# Patient Record
Sex: Female | Born: 1935 | Race: White | Hispanic: No | Smoking: Never smoker
Health system: Southern US, Community
[De-identification: ages and names within clinical notes are randomized; demographics above are authoritative.]

## PROBLEM LIST (undated history)

## (undated) DIAGNOSIS — I480 Paroxysmal atrial fibrillation: Secondary | ICD-10-CM

## (undated) DIAGNOSIS — E785 Hyperlipidemia, unspecified: Secondary | ICD-10-CM

## (undated) DIAGNOSIS — I2721 Secondary pulmonary arterial hypertension: Secondary | ICD-10-CM

## (undated) DIAGNOSIS — I340 Nonrheumatic mitral (valve) insufficiency: Secondary | ICD-10-CM

## (undated) DIAGNOSIS — I10 Essential (primary) hypertension: Secondary | ICD-10-CM

## (undated) DIAGNOSIS — I341 Nonrheumatic mitral (valve) prolapse: Secondary | ICD-10-CM

## (undated) HISTORY — DX: Hyperlipidemia, unspecified: E78.5

## (undated) HISTORY — DX: Nonrheumatic mitral (valve) prolapse: I34.1

## (undated) HISTORY — DX: Nonrheumatic mitral (valve) insufficiency: I34.0

---

## 1978-08-18 HISTORY — PX: BIOPSY BREAST: PRO8

## 1978-08-18 HISTORY — PX: BREAST BIOPSY: SHX20

## 1985-08-18 HISTORY — PX: OTHER SURGICAL HISTORY: SHX169

## 2006-08-18 LAB — CONVERTED CEMR LAB: Pap Smear: NORMAL

## 2007-07-26 ENCOUNTER — Encounter: Payer: Self-pay | Admitting: Internal Medicine

## 2007-10-28 ENCOUNTER — Encounter: Payer: Self-pay | Admitting: Internal Medicine

## 2008-04-27 ENCOUNTER — Encounter: Payer: Self-pay | Admitting: Internal Medicine

## 2008-05-04 ENCOUNTER — Encounter: Payer: Self-pay | Admitting: Internal Medicine

## 2008-08-09 ENCOUNTER — Encounter: Payer: Self-pay | Admitting: Family Medicine

## 2008-08-16 ENCOUNTER — Encounter: Payer: Self-pay | Admitting: Internal Medicine

## 2008-08-16 ENCOUNTER — Encounter: Payer: Self-pay | Admitting: Family Medicine

## 2008-08-18 LAB — HM COLONOSCOPY

## 2008-10-11 ENCOUNTER — Encounter: Payer: Self-pay | Admitting: Family Medicine

## 2008-10-12 ENCOUNTER — Ambulatory Visit: Payer: Self-pay | Admitting: Family Medicine

## 2008-10-12 DIAGNOSIS — M810 Age-related osteoporosis without current pathological fracture: Secondary | ICD-10-CM

## 2008-10-12 DIAGNOSIS — R42 Dizziness and giddiness: Secondary | ICD-10-CM

## 2008-10-12 DIAGNOSIS — I080 Rheumatic disorders of both mitral and aortic valves: Secondary | ICD-10-CM

## 2008-10-12 DIAGNOSIS — E785 Hyperlipidemia, unspecified: Secondary | ICD-10-CM

## 2008-10-12 DIAGNOSIS — H9190 Unspecified hearing loss, unspecified ear: Secondary | ICD-10-CM | POA: Insufficient documentation

## 2008-10-12 DIAGNOSIS — B029 Zoster without complications: Secondary | ICD-10-CM

## 2008-10-12 DIAGNOSIS — I38 Endocarditis, valve unspecified: Secondary | ICD-10-CM | POA: Insufficient documentation

## 2008-10-23 ENCOUNTER — Ambulatory Visit: Payer: Self-pay | Admitting: Cardiovascular Disease

## 2008-10-27 ENCOUNTER — Encounter: Payer: Self-pay | Admitting: Family Medicine

## 2009-01-24 ENCOUNTER — Ambulatory Visit: Payer: Self-pay | Admitting: Family Medicine

## 2009-01-24 LAB — CONVERTED CEMR LAB
ALT: 18 units/L (ref 0–35)
AST: 24 units/L (ref 0–37)
Alkaline Phosphatase: 74 units/L (ref 39–117)
Chloride: 109 meq/L (ref 96–112)
GFR calc non Af Amer: 73.46 mL/min (ref >60)
Glucose, Bld: 88 mg/dL (ref 70–99)
LDL Cholesterol: 108 mg/dL — ABNORMAL HIGH (ref 0–99)
Potassium: 4.4 meq/L (ref 3.5–5.1)
Sodium: 142 meq/L (ref 135–145)
Total Bilirubin: 0.8 mg/dL (ref 0.3–1.2)
VLDL: 14.4 mg/dL (ref 0.0–40.0)

## 2009-01-30 ENCOUNTER — Ambulatory Visit: Payer: Self-pay | Admitting: Family Medicine

## 2009-01-30 DIAGNOSIS — I872 Venous insufficiency (chronic) (peripheral): Secondary | ICD-10-CM | POA: Insufficient documentation

## 2009-04-30 ENCOUNTER — Telehealth: Payer: Self-pay | Admitting: Family Medicine

## 2009-05-08 ENCOUNTER — Encounter: Payer: Self-pay | Admitting: Cardiovascular Disease

## 2009-05-08 ENCOUNTER — Ambulatory Visit: Payer: Self-pay

## 2009-05-15 ENCOUNTER — Ambulatory Visit: Payer: Self-pay | Admitting: Cardiovascular Disease

## 2009-06-11 ENCOUNTER — Telehealth: Payer: Self-pay | Admitting: Family Medicine

## 2009-07-24 ENCOUNTER — Ambulatory Visit: Payer: Self-pay | Admitting: Family Medicine

## 2009-07-24 DIAGNOSIS — I10 Essential (primary) hypertension: Secondary | ICD-10-CM

## 2009-07-24 LAB — CONVERTED CEMR LAB
Albumin: 3.5 g/dL (ref 3.5–5.2)
CO2: 31 meq/L (ref 19–32)
Calcium: 9.2 mg/dL (ref 8.4–10.5)
Cholesterol: 188 mg/dL (ref 0–200)
Creatinine, Ser: 0.9 mg/dL (ref 0.4–1.2)
Glucose, Bld: 87 mg/dL (ref 70–99)
HDL: 57.3 mg/dL (ref >39.00)
Total Protein: 6.1 g/dL (ref 6.0–8.3)
Triglycerides: 73 mg/dL (ref 0.0–149.0)

## 2009-07-31 ENCOUNTER — Ambulatory Visit: Payer: Self-pay | Admitting: Family Medicine

## 2009-07-31 LAB — CONVERTED CEMR LAB: Cholesterol, target level: 200 mg/dL

## 2009-10-12 ENCOUNTER — Encounter: Payer: Self-pay | Admitting: Family Medicine

## 2009-12-05 ENCOUNTER — Ambulatory Visit: Payer: Self-pay | Admitting: Cardiovascular Disease

## 2010-01-30 ENCOUNTER — Ambulatory Visit: Payer: Self-pay | Admitting: Family Medicine

## 2010-01-30 LAB — CONVERTED CEMR LAB
ALT: 22 units/L (ref 0–35)
BUN: 14 mg/dL (ref 6–23)
Chloride: 107 meq/L (ref 96–112)
Cholesterol: 192 mg/dL (ref 0–200)
Creatinine, Ser: 0.8 mg/dL (ref 0.4–1.2)
HDL: 60.7 mg/dL (ref >39.00)
LDL Cholesterol: 120 mg/dL — ABNORMAL HIGH (ref 0–99)
Total Bilirubin: 0.5 mg/dL (ref 0.3–1.2)
Triglycerides: 56 mg/dL (ref 0.0–149.0)
VLDL: 11.2 mg/dL (ref 0.0–40.0)

## 2010-02-12 ENCOUNTER — Ambulatory Visit: Payer: Self-pay | Admitting: Family Medicine

## 2010-07-02 ENCOUNTER — Ambulatory Visit: Payer: Self-pay | Admitting: Cardiovascular Disease

## 2010-07-24 ENCOUNTER — Ambulatory Visit: Payer: Self-pay | Admitting: Family Medicine

## 2010-07-25 LAB — CONVERTED CEMR LAB
AST: 29 units/L (ref 0–37)
Albumin: 3.3 g/dL — ABNORMAL LOW (ref 3.5–5.2)
Alkaline Phosphatase: 72 units/L (ref 39–117)
Basophils Relative: 0.9 % (ref 0.0–3.0)
Eosinophils Relative: 2.5 % (ref 0.0–5.0)
GFR calc non Af Amer: 76.48 mL/min (ref >60.00)
Glucose, Bld: 90 mg/dL (ref 70–99)
LDL Cholesterol: 111 mg/dL — ABNORMAL HIGH (ref 0–99)
Lymphocytes Relative: 22.1 % (ref 12.0–46.0)
Monocytes Relative: 9.8 % (ref 3.0–12.0)
Neutrophils Relative %: 64.7 % (ref 43.0–77.0)
Potassium: 4.4 meq/L (ref 3.5–5.1)
RBC: 4.28 M/uL (ref 3.87–5.11)
Sodium: 141 meq/L (ref 135–145)
Total Bilirubin: 0.6 mg/dL (ref 0.3–1.2)
Total CHOL/HDL Ratio: 3
VLDL: 13.6 mg/dL (ref 0.0–40.0)
WBC: 5 10*3/uL (ref 4.5–10.5)

## 2010-08-02 ENCOUNTER — Ambulatory Visit: Payer: Self-pay | Admitting: Family Medicine

## 2010-09-17 NOTE — Assessment & Plan Note (Signed)
Summary: 8 M F/YU HYPERCHOLESTEROLEMIA/DLO   Vital Signs:  Patient profile:   77 year old female Height:      60 inches Weight:      114.75 pounds BMI:     22.19 Temp:     97.7 degrees F oral Pulse rate:   60 / minute Pulse rhythm:   regular BP sitting:   90 / 60  (left arm) Cuff size:   regular  Vitals Entered By: Benny Lennert CMA Duncan Dull) (February 12, 2010 2:32 PM)  History of Present Illness: Chief complaint 6 month follow up hyperlipidema  Doing well overall.  Some recent increase in swelling in left ankle since weather warm. no ankle pain, or injury.  No calf pain.  Improved when elevated. Exercising three times a wekk, treadmill.   Problems Prior to Update: 1)  Routine Gynecological Examination  (ICD-V72.31) 2)  Other Screening Mammogram  (ICD-V76.12) 3)  Essential Hypertension, Benign  (ICD-401.1) 4)  Venous Insufficiency, Chronic  (ICD-459.81) 5)  Need Prophylactic Vaccination&inoculation Flu  (ICD-V04.81) 6)  Intermittent Vertigo  (ICD-780.4) 7)  Other Osteoporosis  (ICD-733.09) 8)  Hyperlipidemia  (ICD-272.4) 9)  Hx of Herpes Zoster  (ICD-053.9) 10)  Hx of Endocarditis  (ICD-424.90) 11)  Hearing Loss, Tinnitus  (ICD-389.9) 12)  Mitral Regurgitation  (ICD-396.3)  Current Medications (verified): 1)  Zetia 10 Mg Tabs (Ezetimibe) .... Take 1 Tablet By Mouth Once A Day 2)  Calcitonin (Salmon) 200 Unit/act Soln (Calcitonin (Salmon)) .... Use One Spray Per Day Alternating Nostrils. 3)  Multivitamins   Tabs (Multiple Vitamin) .... Take 1 Tablet By Mouth Once A Day  Allergies: 1)  ! Sulfa 2)  ! Codeine 3)  ! Statins 4)  Bishosphonates  Past History:  Past medical, surgical, family and social histories (including risk factors) reviewed, and no changes noted (except as noted below).  Past Medical History: Reviewed history from 12/05/2009 and no changes required. Hyperlipidemia Mitral regurgitation, secondary to mitral valve prolapse.  Past Surgical  History: Reviewed history from 10/12/2008 and no changes required. Hospitalized in 1987 for endocarditis breast bx 1980s: nml   Family History: Reviewed history from 10/12/2008 and no changes required. fahter: MI age 17, CAD mother: unknown cause of death, hip fracture in nursing home sister: esophageeal cancer brother: died "heart gave out" no children  Social History: Reviewed history from 10/12/2008 and no changes required. Retired Married No children Live at [employer] Never Smoked Alcohol use-yes, 1 drink daily, cocktail Drug use-no Regular exercise-yes, 3 times a week at [employer] Diet:   Review of Systems General:  Denies fatigue and fever. CV:  Denies chest pain or discomfort. Resp:  Denies shortness of breath.  Physical Exam  General:  elderly female in NAD Mouth:  MMM Neck:  no carotid bruit or thyromegaly no cervical or supraclavicular lymphadenopathy  Lungs:  Normal respiratory effort, chest expands symmetrically. Lungs are clear to auscultation, no crackles or wheezes. Heart:  RRR with 3/6 late systolic murmur at apex Abdomen:  Bowel sounds positive,abdomen soft and non-tender without masses, organomegaly or hernias noted. Pulses:  R and L posterior tibial pulses are full and equal bilaterally  Extremities:  1+ left pedal edema and trace right pedal edema.    No jooint redness, no pain, full ROM in left ankle Neurologic:  No cranial nerve deficits noted. Station and gait are normal. Plantar reflexes are down-going bilaterally. DTRs are symmetrical throughout. Sensory, motor and coordinative functions appear intact. Skin:  Intact without suspicious lesions or rashes Psych:  Cognition and judgment appear intact. Alert and cooperative with normal attention span and concentration. No apparent delusions, illusions, hallucinations   Impression & Recommendations:  Problem # 1:  HYPERLIPIDEMIA (ICD-272.4) Well controlled. Continue current medication.  Her updated  medication list for this problem includes:    Zetia 10 Mg Tabs (Ezetimibe) .[name]... Take 1 tablet by mouth once a day  Labs Reviewed: SGOT: 30 (01/30/2010)   SGPT: 22 (01/30/2010)  Lipid Goals: Chol Goal: 200 (07/31/2009)   HDL Goal: 40 (07/31/2009)   LDL Goal: 130 (07/31/2009)   TG Goal: 150 (07/31/2009)  Prior 10 Yr Risk Heart Disease: 9 % (07/31/2009)   HDL:60.70 (01/30/2010), 57.30 (07/24/2009)  LDL:120 (01/30/2010), 116 (07/24/2009)  Chol:192 (01/30/2010), 188 (07/24/2009)  Trig:56.0 (01/30/2010), 73.0 (07/24/2009)  Problem # 2:  ESSENTIAL HYPERTENSION, BENIGN (ICD-401.1) Well controlled.  BP today: 90/60 Prior BP: 130/78 (12/05/2009)  Prior 10 Yr Risk Heart Disease: 9 % (07/31/2009)  Labs Reviewed: K+: 4.5 (01/30/2010) Creat: : 0.8 (01/30/2010)   Chol: 192 (01/30/2010)   HDL: 60.70 (01/30/2010)   LDL: 120 (01/30/2010)   TG: 56.0 (01/30/2010)  Problem # 3:  MITRAL REGURGITATION (ICD-396.3) Stable per cards.   Complete Medication List: 1)  Zetia 10 Mg Tabs (Ezetimibe) .... Take 1 tablet by mouth once a day 2)  Calcitonin (salmon) 200 Unit/act Soln (Calcitonin (salmon)) .... Use one spray per day alternating nostrils. 3)  Multivitamins Tabs (Multiple vitamin) .... Take 1 tablet by mouth once a day  Patient Instructions: 1)  Scheduled CPX in 07/2010 with fasting lipids, CMET, cbc prior Dx 401.1  Current Allergies (reviewed today): ! SULFA ! CODEINE ! STATINS BISHOSPHONATES

## 2010-09-17 NOTE — Assessment & Plan Note (Signed)
Summary: 6 month F/U   Visit Type:  Follow-up Referring Provider:  Dr. Tonny Bollman Primary Provider:  Kerby Nora MD  CC:  Denies chest pain or shortness of breath.  " I am doing well"..  History of Present Illness: 77 year old woman with MVP and moderate MR, Hyperlipidemia who presents for routine followup.  Overall she states that she is doing well. She is active, exercises 3 times per week and also uses weights. She denies any shortness of breath, chest pain. She did have some very mild lower extremity edema in her ankles over the summer though this has disappeared.  Review of her family history shows that she does have longevity as her mother passed away at age 61. Her father did pass away at age 74 from a large MI, others in her family have lived into their 24s.  2-D echocardiogram performed May 08, 2009 showed normal left ventricular systolic function. Bileaflet mitral valve prolapse with moderate mitral regurgitation. This is stable from her previous echo studies performed at [facility].  EKG shows normal sinus rhythm with rate 61 beats per minute, no significant ST or T wave changes      Current Medications (verified): 1)  Zetia 10 Mg Tabs (Ezetimibe) .... Take 1 Tablet By Mouth Once A Day 2)  Calcitonin (Salmon) 200 Unit/act Soln (Calcitonin (Salmon)) .... Use One Spray Per Day Alternating Nostrils. 3)  Multivitamins   Tabs (Multiple Vitamin) .... Take 1 Tablet By Mouth Once A Day  Allergies (verified): 1)  ! Sulfa 2)  ! Codeine 3)  ! Statins 4)  Bishosphonates  Past History:  Past Medical History: Last updated: 12/05/2009 Hyperlipidemia Mitral regurgitation, secondary to mitral valve prolapse.  Past Surgical History: Last updated: 10/12/2008 Hospitalized in 1987 for endocarditis breast bx 1980s: nml   Family History: Last updated: 10/12/2008 fahter: MI age 98, CAD mother: unknown cause of death, hip fracture in nursing home sister: esophageeal  cancer brother: died "heart gave out" no children  Social History: Last updated: 10/12/2008 Retired Married No children Live at [employer] Never Smoked Alcohol use-yes, 1 drink daily, cocktail Drug use-no Regular exercise-yes, 3 times a week at [employer] Diet:   Risk Factors: Exercise: yes (10/12/2008)  Risk Factors: Smoking Status: never (10/12/2008)  Review of Systems  The patient denies fever, weight loss, weight gain, vision loss, decreased hearing, hoarseness, chest pain, syncope, dyspnea on exertion, peripheral edema, prolonged cough, abdominal pain, incontinence, muscle weakness, depression, and enlarged lymph nodes.    Vital Signs:  Patient profile:   77 year old female Height:      60 inches Weight:      114 pounds BMI:     22.34 Pulse rate:   61 / minute BP sitting:   108 / 60 Cuff size:   regular  Vitals Entered By: Bishop Dublin, CMA (July 02, 2010 11:01 AM)  Physical Exam  General:  elderly female in NAD Head:  normocephalic and atraumatic Neck:  Neck supple, no JVD. No masses, thyromegaly or abnormal cervical nodes. Lungs:  Clear bilaterally to auscultation and percussion. Heart:  Non-displaced PMI, chest non-tender; regular rate and rhythm, S1, S2 with II/VI SEM at LSB,  no  rubs or gallops. Carotid upstroke normal, no bruit.. Pedals normal pulses. No edema, no varicosities. Abdomen:  Bowel sounds positive; abdomen soft and non-tender without masses Msk:  Back normal, normal gait. Muscle strength and tone normal. Pulses:  pulses normal in all 4 extremities Extremities:  No clubbing or cyanosis.  Neurologic:  Alert and oriented x 3. Skin:  Intact without lesions or rashes. Psych:  Normal affect.   Impression & Recommendations:  Problem # 1:  MITRAL REGURGITATION (ICD-396.3) moderate mitral agitation by history. Last echocardiogram last year. Clinically, she appears stable. As her valve regurgitation has been stable over several years, we'll not  repeat an echocardiogram at this time.  Problem # 2:  HYPERLIPIDEMIA (ICD-272.4) Total cholesterol less than 200 on zetia. We did discuss other cholesterol medications with her and she does not believe that she has been on Lipitor, Crestor or Zocor. We have suggested to her that if she would like to try one of these medications in a low dose in the future, this could be arranged.  Her updated medication list for this problem includes:    Zetia 10 Mg Tabs (Ezetimibe) .[name]... Take 1 tablet by mouth once a day  Problem # 3:  Hx of ENDOCARDITIS (ICD-424.90) She reports a history of endocarditis requiring a long course of antibiotics. She should receive prophylactic antibiotics prior to any dental or surgical procedures.  Patient Instructions: 1)  Your physician recommends that you schedule a follow-up appointment in: 1 year.

## 2010-09-17 NOTE — Assessment & Plan Note (Signed)
Summary: 40m f/u mh   Visit Type:  Follow-up Referring Provider:  Dr. Tonny Bollman Primary Provider:  Kerby Nora MD  CC:  none.  History of Present Illness: 77 year old woman with MVP and moderate MR.  She remains active, reports good energy level, exercises at least 3 times/week. No chest pain, dyspnea, edema, or other complaints.  2-D echocardiogram performed May 08, 2009 showed normal left ventricular systolic function. Bileaflet mitral valve prolapse with moderate mitral regurgitation. This is stable from her previous echo studies performed at [facility].      Current Medications (verified): 1)  Zetia 10 Mg Tabs (Ezetimibe) .... Take 1 Tablet By Mouth Once A Day 2)  Calcitonin (Salmon) 200 Unit/act Soln (Calcitonin (Salmon)) .... Use One Spray Per Day Alternating Nostrils. 3)  Multivitamins   Tabs (Multiple Vitamin) .... Take 1 Tablet By Mouth Once A Day  Allergies (verified): 1)  ! Sulfa 2)  ! Codeine 3)  ! Statins 4)  Bishosphonates  Past History:  Past medical history reviewed for relevance to current acute and chronic problems.  Past Medical History: Hyperlipidemia Mitral regurgitation, secondary to mitral valve prolapse.  Review of Systems       Negative except as per HPI   Vital Signs:  Patient profile:   77 year old female Height:      60 inches Weight:      114.75 pounds BMI:     22.49 Pulse rate:   60 / minute Resp:     14 per minute BP sitting:   130 / 78  (left arm) Cuff size:   large  Physical Exam  General:  Pt is alert and oriented, in no acute distress. HEENT: normal Neck: normal carotid upstrokes without bruits, JVP normal Lungs: CTA CV: RRR with 3/6 late systolic murmur at apex Abd: soft, NT, positive BS, no bruit, no organomegaly Ext: no clubbing, cyanosis, or edema. peripheral pulses 2+ and equal Skin: warm and dry without rash    Echocardiogram  Procedure date:  05/08/2009  Findings:      Study Conclusions            1.  Left ventricle: The cavity size was normal. Wall thickness was        increased in a pattern of mild LVH. Systolic function was normal.        The estimated ejection fraction was in the range of 55% to 65%.        Wall motion was normal; there were no regional wall motion        abnormalities.     2. Mitral valve: Moderate thickening. Moderate prolapse, involving        the anterior leaflet and the posterior leaflet. Moderate        regurgitation.     3. Pulmonary arteries: Systolic pressure was mildly increased.     Impressions:            - Bileaflet prolapse (anterior > posterior); probable moderate MR       (not well seen); suggest TEE to further evaluate if clinically       indicated.  EKG  Procedure date:  12/05/2009  Findings:      NSR, within normal limits, HR 60 bpm.  Impression & Recommendations:  Problem # 1:  MITRAL REGURGITATION (ICD-396.3) Pt with stable MVP with moderate mitral regurgitation. She has NYHA Class I symptoms. Her physical exam is unchanged and LV size has been normal. Recommend 6 month f/u visit  with Dr Mariah Milling. Advised continue SBE prophylaxis.  Problem # 2:  ESSENTIAL HYPERTENSION, BENIGN (ICD-401.1) BP in range - pt not on antihypertensive meds at the present time.  BP today: 130/78 Prior BP: 126/60 (07/31/2009)  Prior 10 Yr Risk Heart Disease: 9 % (07/31/2009)  Labs Reviewed: K+: 4.3 (07/24/2009) Creat: : 0.9 (07/24/2009)   Chol: 188 (07/24/2009)   HDL: 57.30 (07/24/2009)   LDL: 116 (07/24/2009)   TG: 73.0 (07/24/2009)  Patient Instructions: 1)  Your physician recommends that you schedule a follow-up appointment in: 6 months with Dr Mariah Milling

## 2010-09-19 NOTE — Assessment & Plan Note (Signed)
Summary: CPX/DLO  R/S FROM 08/01/10   Vital Signs:  Patient profile:   77 year old female Height:      60 inches Weight:      113.8 pounds BMI:     22.31 Temp:     98.0 degrees F oral Pulse rate:   64 / minute Pulse rhythm:   regular BP sitting:   116 / 72  (left arm) Cuff size:   regular  Vitals Entered By: Benny Lennert CMA Duncan Dull) (August 02, 2010 1:54 PM) CC: Hypertension Management, Lipid Management  Vision Screening:      Vision Comments: patient wears glasses  Vision Entered By: Benny Lennert CMA Duncan Dull) (August 02, 2010 1:55 PM)  Hearing Screen 40db HL: Left  Right  Audiometry Comment: patient can hear whisper from across the room  deaf in left ear.. not interested in hearing aides    Prevention & Chronic Care Immunizations   Influenza vaccine: Historical  (06/17/2010)   Influenza vaccine due: 05/19/2011    Tetanus booster: 08/18/2005: given   Td booster deferral: Not indicated  (08/02/2010)   Tetanus booster due: 08/19/2015    Pneumococcal vaccine: given  (08/18/1993)   Pneumococcal vaccine due: Not Indicated    H. zoster vaccine: 08/02/2010: given  Colorectal Screening   Hemoccult: Not documented   Hemoccult due: Not Indicated    Colonoscopy: normal per pt, done at [facility]  (08/18/2008)   Colonoscopy due: Not Indicated  Other Screening   Pap smear: Normal  (08/18/2006)   Pap smear due: Not Indicated    Mammogram: WISHES to conintue given good health  (08/02/2010)   Mammogram due: 08/03/2011    DXA bone density scan: abnormal, but stable on micalcin  (11/06/2008)   DXA scan due: 11/07/2010    Smoking status: never  (08/02/2010)  Lipids   Total Cholesterol: 181  (07/24/2010)   LDL: 111  (07/24/2010)   LDL Direct: Not documented   HDL: 56.70  (07/24/2010)   Triglycerides: 68.0  (07/24/2010)    SGOT (AST): 29  (07/24/2010)   SGPT (ALT): 22  (07/24/2010)   Alkaline phosphatase: 72  (07/24/2010)   Total bilirubin: 0.6   (07/24/2010)  Hypertension   Last Blood Pressure: 116 / 72  (08/02/2010)   Serum creatinine: 0.8  (07/24/2010)   Serum potassium 4.4  (07/24/2010)  Self-Management Support :    Hypertension self-management support: Not documented    Lipid self-management support: Not documented    History of Present Illness: Chief complaint annual wellness visit  I have personally reviewed the Medicare Annual Wellness questionnaire and have noted 1.   The patient's medical and social history 2.   Their use of alcohol, tobacco or illicit drugs 3.   Their current medications and supplements 4.   The patient's functional ability including ADL's, fall risks, home safety risks and hearing or visual             impairment. 5.   Diet and physical activities 6.   Evidence for depression or mood disorders The patients weight, height, BMI and visual acuity have been recorded in the chart I have made referrals, counseling and provided education to the patient based review of the above and I have provided the pt with a written personalized care plan for preventive services.   Osteoporosis, stable on micalcin.. Last DXA 2010, stable.   MVP and moderate MR: Stable per last OV with Dr. Maryann Conners 05/2010, Last ECHO 04/2009  4 lb weight loss unintended in last  year.  eating less than she used.Well controlled. Continue current medication. appetitie.Marland Kitchenjust eating smaller amounts.  protein levels low.  pt requeste given he good health to continue done mammograms. She would do surgerery/ chemo if she needed.       Hypertension History:      She denies headache, chest pain, palpitations, dyspnea with exertion, peripheral edema, neurologic problems, syncope, and side effects from treatment.  Well controlled .        Positive major cardiovascular risk factors include female age 26 years old or older, hyperlipidemia, and hypertension.  Negative major cardiovascular risk factors include non-tobacco-user status.     Lipid Management History:      Positive NCEP/ATP III risk factors include female age 60 years old or older and hypertension.  Negative NCEP/ATP III risk factors include non-tobacco-user status.        Her compliance with the TLC diet is good.  Adjunctive measures started by the patient include aerobic exercise, folic acid, and weight reduction.  She expresses no side effects from her lipid-lowering medication.  The patient denies any symptoms to suggest myopathy or liver disease.  Comments: On Zetia.    Preventive Screening-Counseling & Management  Alcohol-Tobacco     Alcohol drinks/day: 1     >5/day in last 3 mos: no     Alcohol Counseling: not indicated; use of alcohol is not excessive or problematic     Smoking Status: never  Caffeine-Diet-Exercise     Diet Comments: eating a lot of veggies, fruit, protein ids low.     Diet Counseling: to improve diet; diet is suboptimal     Nutrition Referrals: not indicated.. just increase protein in diet     Does Patient Exercise: yes     Exercise (avg: min/session): 30-60     Times/week: 3  Problems Prior to Update: 1)  Routine Gynecological Examination  (ICD-V72.31) 2)  Other Screening Mammogram  (ICD-V76.12) 3)  Essential Hypertension, Benign  (ICD-401.1) 4)  Venous Insufficiency, Chronic  (ICD-459.81) 5)  Need Prophylactic Vaccination&inoculation Flu  (ICD-V04.81) 6)  Intermittent Vertigo  (ICD-780.4) 7)  Other Osteoporosis  (ICD-733.09) 8)  Hyperlipidemia  (ICD-272.4) 9)  Hx of Herpes Zoster  (ICD-053.9) 10)  Hx of Endocarditis  (ICD-424.90) 11)  Hearing Loss, Tinnitus  (ICD-389.9) 12)  Mitral Regurgitation  (ICD-396.3)  Current Medications (verified): 1)  Zetia 10 Mg Tabs (Ezetimibe) .... Take 1 Tablet By Mouth Once A Day 2)  Calcitonin (Salmon) 200 Unit/act Soln (Calcitonin (Salmon)) .... Use One Spray Per Day Alternating Nostrils. 3)  Multivitamins   Tabs (Multiple Vitamin) .... Take 1 Tablet By Mouth Once A  Day  Allergies: 1)  ! Sulfa 2)  ! Codeine 3)  ! Statins 4)  Bishosphonates  Past History:  Past medical, surgical, family and social histories (including risk factors) reviewed, and no changes noted (except as noted below).  Past Medical History: Reviewed history from 12/05/2009 and no changes required. Hyperlipidemia Mitral regurgitation, secondary to mitral valve prolapse.  Past Surgical History: Reviewed history from 10/12/2008 and no changes required. Hospitalized in 1987 for endocarditis breast bx 1980s: nml   Family History: Reviewed history from 10/12/2008 and no changes required. fahter: MI age 15, CAD mother: unknown cause of death, hip fracture in nursing home sister: esophageeal cancer brother: died "heart gave out" no children  Social History: Reviewed history from 10/12/2008 and no changes required. Retired Married No children Live at [facility] Never Smoked Alcohol use-yes, 1 drink daily, cocktail Drug use-no Regular  exercise-yes, 3 times a week at [facility] Diet:   Review of Systems General:  Denies fatigue and fever. CV:  Denies chest pain or discomfort. Resp:  Denies shortness of breath. GI:  Denies abdominal pain, bloody stools, and change in bowel habits. GU:  Denies abnormal vaginal bleeding, discharge, and dysuria. Psych:  Denies anxiety and depression.  Physical Exam  General:  elderly female in NAD Eyes:  No corneal or conjunctival inflammation noted. EOMI. Perrla. Funduscopic exam benign, without hemorrhages, exudates or papilledema. Vision grossly normal. Ears:  External ear exam shows no significant lesions or deformities.  Otoscopic examination reveals clear canals, tympanic membranes are intact bilaterally without bulging, retraction, inflammation or discharge. Hearing is grossly normal bilaterally. Nose:  External nasal examination shows no deformity or inflammation. Nasal mucosa are pink and moist without lesions or exudates. Mouth:   Oral mucosa and oropharynx without lesions or exudates.  Teeth in good repair. Neck:  no carotid bruit or thyromegaly no cervical or supraclavicular lymphadenopathy  Chest Wall:  No deformities, masses, or tenderness noted. Breasts:  No mass, nodules, thickening, tenderness, bulging, retraction, inflamation, nipple discharge or skin changes noted.   Lungs:  Normal respiratory effort, chest expands symmetrically. Lungs are clear to auscultation, no crackles or wheezes. Heart:  Normal rate and regular rhythm. S1 and S2 normal without gallop, murmur, click, rub or other extra sounds. Abdomen:  Bowel sounds positive,abdomen soft and non-tender without masses, organomegaly or hernias noted. Genitalia:  not indicated Msk:  No deformity or scoliosis noted of thoracic or lumbar spine.   Pulses:  R and L posterior tibial pulses are full and equal bilaterally  Extremities:  no edema  Skin:  Intact without suspicious lesions or rashes Psych:  Cognition and judgment appear intact. Alert and cooperative with normal attention span and concentration. No apparent delusions, illusions, hallucinations   Impression & Recommendations:  Problem # 1:  PREVENTIVE HEALTH CARE (ICD-V70.0)  The patient's preventative maintenance and recommended screening tests for an annual wellness exam were reviewed in full today. Brought up to date unless services declined.  Counselled on the importance of diet, exercise, and its role in overall health and mortality. The patient's FH and SH was reviewed, including their home life, tobacco status, and drug and alcohol status.     Orders: Medicare -1st Annual Wellness Visit [phone])  Problem # 2:  ESSENTIAL HYPERTENSION, BENIGN (ICD-401.1)  Well controlled. Continue current medication.   BP today: 116/72 Prior BP: 108/60 (07/02/2010)  10 Yr Risk Heart Disease: 6 % Prior 10 Yr Risk Heart Disease: 9 % (07/31/2009)  Labs Reviewed: K+: 4.4 (07/24/2010) Creat: : 0.8  (07/24/2010)   Chol: 181 (07/24/2010)   HDL: 56.70 (07/24/2010)   LDL: 111 (07/24/2010)   TG: 68.0 (07/24/2010)  Problem # 3:  HYPERLIPIDEMIA (ICD-272.4) Well controlled.. discussed changing to cehaper med or trial of stopping zetia... She does not want to try stain even low dose. if anything...on follow up in 6 months when she is out of medicaine wants to consider stopping zetia.  Her updated medication list for this problem includes:    Zetia 10 Mg Tabs (Ezetimibe) .[name]... Take 1 tablet by mouth once a day  Problem # 4:  OTHER OSTEOPOROSIS (ICD-733.09) DXA 2010 stable.  Her updated medication list for this problem includes:    Calcitonin (salmon) 200 Unit/act Soln (Calcitonin (salmon)) ..... Use one spray per day alternating nostrils.  Complete Medication List: 1)  Zetia 10 Mg Tabs (Ezetimibe) .... Take 1 tablet by  mouth once a day 2)  Calcitonin (salmon) 200 Unit/act Soln (Calcitonin (salmon)) .... Use one spray per day alternating nostrils. 3)  Multivitamins Tabs (Multiple vitamin) .... Take 1 tablet by mouth once a day  Other Orders: Radiology Referral (Radiology) Zoster (Shingles) Vaccine Live [phone]) Admin 1st Vaccine (82956)  Hypertension Assessment/Plan:      The patient's hypertensive risk group is category B: At least one risk factor (excluding diabetes) with no target organ damage.  Her calculated 10 year risk of coronary heart disease is 6 %.  Today's blood pressure is 116/72.  Her blood pressure goal is < 140/90.  Lipid Assessment/Plan:      Based on NCEP/ATP III, the patient's risk factor category is "2 or more risk factors and a calculated 10 year CAD risk of < 20%".  The patient's lipid goals are as follows: Total cholesterol goal is 200; LDL cholesterol goal is 130; HDL cholesterol goal is 40; Triglyceride goal is 150.  Her LDL cholesterol goal has been met.    Patient Instructions: 1)  I have provided you with a copy of your personalized plan for preventive services.  Please take the time to review along with your updated medication list.  2)  Please schedule a follow-up appointment in 6 months .  3)   Work on increasing lean  protein in diet... consider adding protein supplement to one meal a day.  4)  BMP prior to visit, ICD-9: 272.0 5)  Hepatic Panel prior to visit ICD-9:  6)  Lipid panel prior to visit ICD-9 :  7)  Referral Appointment Information 8)  Day/Date: 9)  Time: 10)  Place/MD: 11)  Address: 12)  Phone/Fax: 13)  Patient given appointment information. Information/Orders faxed/mailed.    Orders Added: 1)  Radiology Referral [Radiology] 2)  Medicare -1st Annual Wellness Visit [G0438] 3)  Est. Patient Level III [21308] 4)  Zoster (Shingles) Vaccine Live [90736] 5)  Admin 1st Vaccine [90471]   Immunization History:  Influenza Immunization History:    Influenza:  historical (06/17/2010)  Immunizations Administered:  Zostavax # 1:    Vaccine Type: Zostavax    Site: right deltoid    Mfr: Merck    Dose: 0.5 ml    Route: IM    Given by: Benny Lennert CMA (AAMA)    Exp. Date: 04/05/2011    Lot #: [id number]    VIS given: 05/30/05 given August 02, 2010.   Immunization History:  Influenza Immunization History:    Influenza:  Historical (06/17/2010)  Immunizations Administered:  Zostavax # 1:    Vaccine Type: Zostavax    Site: right deltoid    Mfr: Merck    Dose: 0.5 ml    Route: IM    Given by: Benny Lennert CMA (AAMA)    Exp. Date: 04/05/2011    Lot #: [id number]    VIS given: 05/30/05 given August 02, 2010.   Current Allergies (reviewed today): ! SULFA ! CODEINE ! STATINS BISHOSPHONATES  Last Flu Vaccine:  given (06/18/2009 12:27:28 PM) Flu Vaccine Result Date:  05/18/2010 Flu Vaccine Result:  given Flu Vaccine Next Due:  1 yr Herpes Zoster Result Date:  08/02/2010 Herpes Zoster Result:  given Last Colonoscopy:  normal, hx of polyps (08/18/2004 1:34:20 PM) Colonoscopy Result Date:  08/18/2008 Colonoscopy  Result:  normal per pt, done at [facility] Colonoscopy Next Due:  Not Indicated Last PAP:  Normal (08/18/2006 10:53:19 AM) PAP Next Due:  Not Indicated Last Mammogram:  normal (10/11/2008 1:16:24 PM) Mammogram  Result Date:  08/02/2010 Mammogram Result:  WISHES to conintue given good health Mammogram Next Due:  1 yr Last Bone Density:  abnormal, but stable on micalcin (11/06/2008 2:57:25 PM) Bone Density Next Due: 2 yr

## 2010-09-19 NOTE — Letter (Signed)
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

## 2010-10-21 ENCOUNTER — Ambulatory Visit: Payer: Self-pay | Admitting: Family Medicine

## 2010-10-21 ENCOUNTER — Encounter: Payer: Self-pay | Admitting: Family Medicine

## 2010-10-28 ENCOUNTER — Encounter (INDEPENDENT_AMBULATORY_CARE_PROVIDER_SITE_OTHER): Payer: Self-pay | Admitting: *Deleted

## 2010-10-28 LAB — HM MAMMOGRAPHY

## 2010-11-05 NOTE — Letter (Signed)
Summary: Results Follow up Letter  [facility]  [address] [place], [place] [address]   Phone: [phone]  Fax: [phone]    10/28/2010 MRN: [id number]     Brandi [name] [address] [place], [place]  [address]    Dear Ms. Hausmann,  The following are the results of your recent test(s):  Test         Result    Pap Smear:        Normal _____  Not Normal _____ Comments: ______________________________________________________ Cholesterol: LDL(Bad cholesterol):         Your goal is less than:         HDL (Good cholesterol):       Your goal is more than: Comments:  ______________________________________________________ Mammogram:        Normal __x___  Not Normal _____ Comments:Repeat in 1 year  ___________________________________________________________________ Hemoccult:        Normal _____  Not normal _______ Comments:    _____________________________________________________________________ Other Tests:    We routinely do not discuss normal results over the telephone.  If you desire a copy of the results, or you have any questions about this information we can discuss them at your next office visit.   Sincerely,  Kerby Nora MD

## 2010-11-11 ENCOUNTER — Encounter: Payer: Self-pay | Admitting: Family Medicine

## 2010-11-12 ENCOUNTER — Encounter: Payer: Self-pay | Admitting: Family Medicine

## 2010-11-12 ENCOUNTER — Ambulatory Visit (INDEPENDENT_AMBULATORY_CARE_PROVIDER_SITE_OTHER): Payer: Medicare Other | Admitting: Family Medicine

## 2010-11-12 DIAGNOSIS — H6120 Impacted cerumen, unspecified ear: Secondary | ICD-10-CM

## 2010-11-12 DIAGNOSIS — M542 Cervicalgia: Secondary | ICD-10-CM

## 2010-11-12 NOTE — Assessment & Plan Note (Signed)
Decrease ROM in necka nd ttp over right Trapezius suggest... 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 No suggestion of compression fracture.  Will treat with heat, massage and gentle stretching.  Follow up if not improving.  Info given.

## 2010-11-12 NOTE — Assessment & Plan Note (Signed)
No impaction of cerumen, but significant wax in right ear... Irrigated. No infection internal or external seen. Call if symptoms not improved.

## 2010-11-12 NOTE — Patient Instructions (Addendum)
Decrease ROM in neck and tenderness over right trapezius muscle suggests... 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 No suggestion of compression fracture.  Will treat with heat, massage and gentle stretching. Follow up if not improving.  If ear changes continued call for update in 1-2 weeks.[name] Possible referral to ENT.

## 2010-11-12 NOTE — Progress Notes (Signed)
  Subjective:    Patient ID: Brandi [name], female    DOB: 1927/07/31, 77 y.o.   MRN: [id number]  Otalgia  There is pain in the right ear. This is a recurrent problem. The current episode started more than 1 month ago (Hears "squshy sound in right ear" with chewing). The problem occurs constantly. The problem has been waxing and waning. There has been no fever. The pain is at a severity of 3/10 (ear occasionaly painful.. more constatnt pain in right posterior neck). The pain is mild. Associated symptoms include hearing loss and neck pain. Pertinent negatives include no coughing, drainage, ear discharge, headaches, rash, rhinorrhea, sore throat or vomiting. Associated symptoms comments: Left ear chronic hearing loss and tinnitus . She has tried nothing for the symptoms. Her past medical history is significant for hearing loss. There is no history of a chronic ear infection or a tympanostomy tube.      Review of Systems  Constitutional: Negative for fever and fatigue.  HENT: Positive for hearing loss, ear pain and neck pain. Negative for sore throat, rhinorrhea and ear discharge.   Eyes: Negative for pain and discharge.  Respiratory: Negative for cough.   Cardiovascular: Negative for chest pain.  Gastrointestinal: Negative for vomiting.  Skin: Negative for rash.  Neurological: Negative for weakness, numbness and headaches.       Objective:   Physical Exam  Constitutional: She appears well-developed and well-nourished.  HENT:  Head: Normocephalic and atraumatic.  Right Ear: Hearing normal. No drainage or tenderness. No foreign bodies. Tympanic membrane is not injected and not bulging. Tympanic membrane mobility is normal. No middle ear effusion.  Left Ear: Tympanic membrane, external ear and ear canal normal. Decreased hearing is noted.  Nose: Nose normal. No nasal deformity or septal deviation. Right sinus exhibits no maxillary sinus tenderness. Left sinus exhibits no maxillary sinus  tenderness.  Mouth/Throat: Oropharynx is clear and moist. No oropharyngeal exudate.       Cerumen blocking half of ear canal on right.Marland Kitchentympanic membrane visualized  Eyes: Conjunctivae are normal. Pupils are equal, round, and reactive to light. Right eye exhibits discharge. Left eye exhibits no discharge.  Neck: Trachea normal. Neck supple. Normal carotid pulses present. Muscular tenderness present. No spinous process tenderness present. Carotid bruit is not present. Decreased range of motion present. No mass and no thyromegaly present.  Cardiovascular: Normal rate, regular rhythm, S1 normal, S2 normal, normal heart sounds, intact distal pulses and normal pulses.  Exam reveals no gallop.   No murmur heard. Pulmonary/Chest: Effort normal and breath sounds normal. She has no decreased breath sounds. She has no wheezes. She has no rhonchi. She has no rales.          Assessment & Plan:

## 2010-12-31 NOTE — Assessment & Plan Note (Signed)
[facility] OFFICE NOTE   NAME:Brandi [name], Brandi [name]                           MRN:          [id number]  DATE:10/23/2008                            DOB:          [birth year]    REASON FOR VISIT:  Evaluate the patient with mitral regurgitation.   HISTORY OF PRESENT ILLNESS:  Ms. Brandi [name] is a 77 year old woman with long-  standing mitral valve prolapse and mitral regurgitation.  She was never  told of a heart murmur or valvular heart disease until she was  hospitalized in 1987 with what sounds like septicemia by the patient's  report.  She describes a blood stream infection. At that time, she  underwent an echocardiogram that showed mitral valve prolapse and mitral  regurgitation.  She has been regularly followed over the last few years  by Dr. Zoila Shutter in [place].  Dr. Zoila Shutter has performed serial  echocardiography and routine clinical evaluation as well.  The patient  reports at the time of her last visit she was told that she will have an  annual followup echocardiogram and office visit.   She denies any cardiac symptoms.  She is remarkably active and has no  exertional symptoms.  She exercises 3 times a week with 30 minutes on  the treadmill and 20 minutes of weights.  She denies dyspnea, chest  pain, edema, palpitations, lightheadedness, or syncope.  She denies  cough, orthopnea, or PND.   PAST MEDICAL HISTORY:  1. Mitral regurgitation as outlined above, presumably secondary to      mitral valve prolapse.  2. Hyperlipidemia.  3. Breast biopsies for benign breast disease in the 1980s.   FAMILY HISTORY:  The patient's father died of a myocardial infarction at  age 78.  Mother died in older years of complications from a hip  fracture.  She has a sister who died from cancer at age 42 and a brother  who died from congestive heart failure at age 86.   SOCIAL HISTORY:  The patient is a retired Runner, broadcasting/film/video.  She is from  the  [place], [place], area.  She does not have children,, although she raised  her stepson from the age of 25 years old.  She and her husband live at  [facility].  She drinks 1 alcoholic drink daily.  She has never been a  cigarette smoker.   CURRENT MEDICATIONS:  1. Zetia 10 mg daily.  2. Calcitonin 1 spray daily.  3. Multivitamin daily.   Allergies include SULFA, CODEINE, STATINS, and BISPHOSPHONATES.   REVIEW OF SYSTEMS:  A complete 12-point review of systems was performed.  There are no pertinent positives to report except as outlined in the  HPI.   PHYSICAL EXAMINATION:  GENERAL:  This is an elderly woman in no acute  distress.  VITAL SIGNS:  Her weight is 115 pounds, blood pressure is 158/70, heart  rate 62, respiratory rate is 12.  HEENT:  Normal.  NECK:  Normal carotid upstrokes with soft bilateral carotid bruits.  JVP  is normal.  There is no thyromegaly or  thyroid nodules.  LUNGS:  Clear bilaterally.  HEART:  The apex is discrete and nondisplaced.  There is no right  ventricular heave or lift.  HEART:  Regular rate and rhythm with grade 3/6 midsystolic murmur,  peaking in late systole.  There are no diastolic murmurs or gallops.  ABDOMEN:  Soft, thin, nontender.  No organomegaly.  BACK:  No CVA tenderness.  EXTREMITIES:  No clubbing, cyanosis, or edema.  Peripheral pulses are 2+  and equal throughout.  SKIN:  Warm and dry without rash.  NEUROLOGIC:  Cranial nerves II through XII are intact.  Strength is  intact and equal bilaterally.   EKG shows normal sinus rhythm and is within normal limits.   ASSESSMENT:  1. Mitral valve prolapse with mitral regurgitation.  The patient is      asymptomatic.  Her last echocardiogram was performed in September      2009 by the patient's report.  We will send for records from [facility], where the patient has been cared for in the      past.  She has been taking SBE prophylaxis with amoxicillin for       over 20 years.  She would like to continue with this.  Even though      the recommendations have changed, I think the risk and benefit in      her case is favorable to continue antibiotics and have agreed with      this approach.  Likely, we will perform an echocardiogram at the      time of her return visit in 6 months to reassess left ventricular      size and systolic function as well as the severity of her mitral      regurgitation.  We will obtain records and make further      determinations after their review.  2. High blood pressure.  The patient reports good blood pressure      control at home.  I suspect she has white coat hypertension.  I      have asked her to begin monitoring her home blood pressure more      frequently and to call in with the result.   For followup, I would like to see [name] in 6 months.  If she has any  problems in the interim, I would be happy to see her sooner.  I  appreciate the opportunity of participating in her care.     Veverly Fells. Excell Seltzer, MD  Electronically Signed    MDC/MedQ  DD: 10/23/2008  DT: 10/24/2008  Job #: [id number]   cc:   Kerby Nora, MD

## 2011-01-23 ENCOUNTER — Other Ambulatory Visit: Payer: Self-pay | Admitting: Family Medicine

## 2011-01-23 DIAGNOSIS — E7800 Pure hypercholesterolemia, unspecified: Secondary | ICD-10-CM

## 2011-01-28 ENCOUNTER — Other Ambulatory Visit (INDEPENDENT_AMBULATORY_CARE_PROVIDER_SITE_OTHER): Payer: Medicare Other | Admitting: Family Medicine

## 2011-01-28 DIAGNOSIS — E7800 Pure hypercholesterolemia, unspecified: Secondary | ICD-10-CM

## 2011-01-28 LAB — BASIC METABOLIC PANEL
BUN: 15 mg/dL (ref 6–23)
Calcium: 8.8 mg/dL (ref 8.4–10.5)
GFR: 77.54 mL/min (ref >60.00)
Glucose, Bld: 88 mg/dL (ref 70–99)
Potassium: 4.2 mEq/L (ref 3.5–5.1)

## 2011-01-28 LAB — LDL CHOLESTEROL, DIRECT: Direct LDL: 150.7 mg/dL

## 2011-01-28 LAB — HEPATIC FUNCTION PANEL
AST: 31 U/L (ref 0–37)
Albumin: 3.7 g/dL (ref 3.5–5.2)

## 2011-01-28 LAB — LIPID PANEL
HDL: 64.7 mg/dL (ref >39.00)
Triglycerides: 83 mg/dL (ref 0.0–149.0)

## 2011-02-04 ENCOUNTER — Ambulatory Visit: Payer: Self-pay | Admitting: Family Medicine

## 2011-02-04 ENCOUNTER — Encounter: Payer: Self-pay | Admitting: Cardiovascular Disease

## 2011-02-10 ENCOUNTER — Encounter: Payer: Self-pay | Admitting: Family Medicine

## 2011-02-10 ENCOUNTER — Ambulatory Visit (INDEPENDENT_AMBULATORY_CARE_PROVIDER_SITE_OTHER): Payer: Medicare Other | Admitting: Family Medicine

## 2011-02-10 DIAGNOSIS — I10 Essential (primary) hypertension: Secondary | ICD-10-CM

## 2011-02-10 DIAGNOSIS — I872 Venous insufficiency (chronic) (peripheral): Secondary | ICD-10-CM

## 2011-02-10 DIAGNOSIS — R252 Cramp and spasm: Secondary | ICD-10-CM

## 2011-02-10 DIAGNOSIS — E785 Hyperlipidemia, unspecified: Secondary | ICD-10-CM

## 2011-02-10 NOTE — Assessment & Plan Note (Signed)
Recommended elevating legs, compression hose. Avoid salt. Continue exercise.

## 2011-02-10 NOTE — Assessment & Plan Note (Signed)
Worsened control off zetia. Pt wants to reduce her risk of CAD despite her age, so if remains above 130, we will restart zetia at her AMW visit.

## 2011-02-10 NOTE — Assessment & Plan Note (Signed)
Likely due to lactic acid from her exercsie schedule, no symp of other issues. Electrolytes nml. Does not get enough water intake. Increase fluids, stretching and try yellow mustard.

## 2011-02-10 NOTE — Progress Notes (Signed)
  Subjective:    Patient ID: Brandi [name], female    DOB: [birth year], 77 y.o.   MRN: [id number]  HPI  High cholesterol: LDL up to 151 off of Zetia for 6 months. There is no cost issue with zetia, she was just interested in seeing if she could stop this medicaiton. Worried about liver toxicity with other chol meds.  She wants to follow chol over next 6 months and recheck. If still above goal 130 she wants to be aggressive and restart zetia. No SE to zetia.  She works on [employer], walking daily and going to gym M, W, F. Also doing [employer].  Occ leg cramps at night in bed. Mild. Denies creepy crawly feeling or restless legs. Occ swelling in legs, left worse than right. Worse later in the day. BPs well controlled. Less of a problem in winter.    Review of Systems  Constitutional: Negative for fever and fatigue.  HENT: Negative for ear pain.   Eyes: Negative for pain.  Respiratory: Negative for chest tightness and shortness of breath.   Cardiovascular: Negative for chest pain, palpitations and leg swelling.  Gastrointestinal: Negative for abdominal pain.  Genitourinary: Negative for dysuria.       Objective:   Physical Exam  Constitutional: Vital signs are normal. She appears well-developed and well-nourished. She is cooperative.  Non-toxic appearance. She does not appear ill. No distress.  HENT:  Head: Normocephalic.  Right Ear: Hearing, tympanic membrane, external ear and ear canal normal. Tympanic membrane is not erythematous, not retracted and not bulging.  Left Ear: Hearing, tympanic membrane, external ear and ear canal normal. Tympanic membrane is not erythematous, not retracted and not bulging.  Nose: No mucosal edema or rhinorrhea. Right sinus exhibits no maxillary sinus tenderness and no frontal sinus tenderness. Left sinus exhibits no maxillary sinus tenderness and no frontal sinus tenderness.  Mouth/Throat: Uvula is midline, oropharynx is clear and moist and mucous  membranes are normal.  Eyes: Conjunctivae, EOM and lids are normal. Pupils are equal, round, and reactive to light. No foreign bodies found.  Neck: Trachea normal and normal range of motion. Neck supple. Carotid bruit is not present. No mass and no thyromegaly present.  Cardiovascular: Normal rate, regular rhythm, S1 normal, S2 normal, normal heart sounds, intact distal pulses and normal pulses.  Exam reveals no gallop and no friction rub.   No murmur heard.      Varicose veins B, no edema  Pulmonary/Chest: Effort normal and breath sounds normal. Not tachypneic. No respiratory distress. She has no decreased breath sounds. She has no wheezes. She has no rhonchi. She has no rales.  Abdominal: Soft. Normal appearance and bowel sounds are normal. There is no tenderness.  Neurological: She is alert.  Skin: Skin is warm, dry and intact. No rash noted.  Psychiatric: Her speech is normal and behavior is normal. Judgment and thought content normal. Her mood appears not anxious. Cognition and memory are normal. She does not exhibit a depressed mood.          Assessment & Plan:

## 2011-02-10 NOTE — Patient Instructions (Addendum)
Try 1-2 teaspoons of yellow mustard for leg cramps for lactic acid. Increase water in diet. Elevate feet above heart when able. Consider  mild compression hose.

## 2011-02-10 NOTE — Assessment & Plan Note (Signed)
Well controlled. Continue current medication.  

## 2011-06-16 ENCOUNTER — Other Ambulatory Visit: Payer: Self-pay | Admitting: Family Medicine

## 2011-06-17 ENCOUNTER — Encounter: Payer: Self-pay | Admitting: Cardiovascular Disease

## 2011-06-19 ENCOUNTER — Ambulatory Visit: Payer: Medicare Other | Admitting: Cardiovascular Disease

## 2011-06-24 ENCOUNTER — Encounter: Payer: Self-pay | Admitting: Cardiovascular Disease

## 2011-06-26 ENCOUNTER — Encounter: Payer: Self-pay | Admitting: Cardiovascular Disease

## 2011-06-26 ENCOUNTER — Ambulatory Visit (INDEPENDENT_AMBULATORY_CARE_PROVIDER_SITE_OTHER): Payer: Medicare Other | Admitting: Cardiovascular Disease

## 2011-06-26 DIAGNOSIS — I10 Essential (primary) hypertension: Secondary | ICD-10-CM

## 2011-06-26 DIAGNOSIS — I059 Rheumatic mitral valve disease, unspecified: Secondary | ICD-10-CM

## 2011-06-26 DIAGNOSIS — R609 Edema, unspecified: Secondary | ICD-10-CM

## 2011-06-26 DIAGNOSIS — E785 Hyperlipidemia, unspecified: Secondary | ICD-10-CM

## 2011-06-26 DIAGNOSIS — I340 Nonrheumatic mitral (valve) insufficiency: Secondary | ICD-10-CM

## 2011-06-26 DIAGNOSIS — I080 Rheumatic disorders of both mitral and aortic valves: Secondary | ICD-10-CM

## 2011-06-26 NOTE — Progress Notes (Signed)
Patient ID: Brandi [name], female    DOB: 12-03-1926, 77 y.o.   MRN: [id number]  HPI Comments: 77 year old woman with MVP and moderate MR, Hyperlipidemia who presents for routine followup.   Overall she states that she is doing well. She is active, exercises 3 times per week and also uses weights. She denies any shortness of breath, chest pain. She did have some very mild lower extremity edema in her ankles over the summer though this has disappeared. She is still concerned about her edema and whether the valve may have contributed.  She would like to repeat the echocardiogram to make sure the regurgitation is not worse    mother passed away at age 59.  father did pass away at age 72 from a large MI, others in her family have lived into their 7s.  She was unable to tolerate zetia and her cholesterol climbed about 50 points. She is scheduled to follow up with Dr. Ermalene Searing.   2-D echocardiogram performed May 08, 2009 showed normal left ventricular systolic function. Bileaflet mitral valve prolapse with moderate mitral regurgitation.    EKG shows normal sinus rhythm with rate 63 beats per minute, no significant ST or T wave changes, rare PVCs      Outpatient Encounter Prescriptions as of 06/26/2011  Medication Sig Dispense Refill  . aspirin 81 MG tablet Take 81 mg by mouth daily. Takes three times a week       . calcitonin, salmon, (MIACALCIN/FORTICAL) 200 UNIT/ACT nasal spray USE 1 SPRAY PER DAY ALTERNATING NOSTRILS  3.7 mL  5  . Multiple Vitamin (MULTIVITAMIN) tablet Take 1 tablet by mouth daily.        [name] DISCONTD: ezetimibe (ZETIA) 10 MG tablet Take 10 mg by mouth daily.          Review of Systems  Constitutional: Negative.   HENT: Negative.   Eyes: Negative.   Respiratory: Negative.   Cardiovascular: Positive for leg swelling.       Edema over the summer  Gastrointestinal: Negative.   Musculoskeletal: Negative.   Skin: Negative.   Neurological: Negative.   Hematological:  Negative.   Psychiatric/Behavioral: Negative.   All other systems reviewed and are negative.   BP 108/64  Pulse 63  Ht 5\' 1"  (1.549 m)  Wt 114 lb (51.71 kg)  BMI 21.54 kg/m2   Physical Exam  Nursing note and vitals reviewed. Constitutional: She is oriented to person, place, and time. She appears well-developed and well-nourished.  HENT:  Head: Normocephalic.  Nose: Nose normal.  Mouth/Throat: Oropharynx is clear and moist.  Eyes: Conjunctivae are normal. Pupils are equal, round, and reactive to light.  Neck: Normal range of motion. Neck supple. No JVD present.  Cardiovascular: Normal rate, regular rhythm, S1 normal, S2 normal and intact distal pulses.  Frequent extrasystoles are present. Exam reveals no gallop and no friction rub.   Murmur heard.  Crescendo systolic murmur is present with a grade of 3/6  Pulmonary/Chest: Effort normal and breath sounds normal. No respiratory distress. She has no wheezes. She has no rales. She exhibits no tenderness.  Abdominal: Soft. Bowel sounds are normal. She exhibits no distension. There is no tenderness.  Musculoskeletal: Normal range of motion. She exhibits no edema and no tenderness.  Lymphadenopathy:    She has no cervical adenopathy.  Neurological: She is alert and oriented to person, place, and time. Coordination normal.  Skin: Skin is warm and dry. No rash noted. No erythema.  Psychiatric: She has  a normal mood and affect. Her behavior is normal. Judgment and thought content normal.         Assessment and Plan

## 2011-06-26 NOTE — Assessment & Plan Note (Signed)
She is concerned about progression of her MR. We have ordered an echo to evaluate LV and LA size, PA pressures.

## 2011-06-26 NOTE — Assessment & Plan Note (Signed)
Edema has resolved. He was worse over the summer. Likely secondary to venous insufficiency. We have mentioned that if her symptoms get worse again, we could try p.r.n. Lasix though this may not alleviate her symptoms if it is dependent edema.

## 2011-06-26 NOTE — Assessment & Plan Note (Signed)
Her numbers were better on cholesterol medication. We have shown her her labs over the past 2 years. She will discuss this again with Dr. Ermalene Searing.

## 2011-06-26 NOTE — Patient Instructions (Addendum)
You are doing well. No medication changes were made.  Please call us if you have new issues that need to be addressed before your next appt.  The office will contact you for a follow up Appt. In 12 months  We will schedule an echocardiogram to look at the mitral valve regurgitation/murmur Your physician has requested that you have an echocardiogram. Echocardiography is a painless test that uses sound waves to create images of your heart. It provides your doctor with information about the size and shape of your heart and how well your heart's chambers and valves are working. This procedure takes approximately one hour. There are no restrictions for this procedure.

## 2011-06-27 ENCOUNTER — Encounter: Payer: Self-pay | Admitting: Family Medicine

## 2011-06-27 ENCOUNTER — Ambulatory Visit (INDEPENDENT_AMBULATORY_CARE_PROVIDER_SITE_OTHER): Payer: Medicare Other | Admitting: Family Medicine

## 2011-06-27 VITALS — BP 118/62 | HR 72 | Temp 98.7°F | Ht 61.5 in | Wt 114.2 lb

## 2011-06-27 DIAGNOSIS — L255 Unspecified contact dermatitis due to plants, except food: Secondary | ICD-10-CM

## 2011-06-27 MED ORDER — FLUOCINONIDE-E 0.05 % EX CREA: TOPICAL_CREAM | Freq: Two times a day (BID) | CUTANEOUS | Status: DC

## 2011-06-27 NOTE — Patient Instructions (Signed)
Nice to meet you, Ms. Fairfax. Please apply the lidex to your face twice daily. Do not use for more than two weeks without calling us. Have a good weekend.

## 2011-06-27 NOTE — Progress Notes (Signed)
  Subjective:    Patient ID: Brandi [name], female    DOB: Dec 28, 1926, 77 y.o.   MRN: [id number]  HPI  77 yo here for ?poison ivy.  Was working in the woods at [employer] a week or two ago. Next day had this area on her right chin that was very itchy. Has tried OTC hydrocortisone cream without improvement.  Does not have it anywhere else. She does have a known sensitivity to poison ivy.  No wheezing or respiratory symptoms.  Patient Active Problem List  Diagnoses  . HERPES ZOSTER  . HYPERLIPIDEMIA  . HEARING LOSS, TINNITUS  . MITRAL REGURGITATION  . ESSENTIAL HYPERTENSION, BENIGN  . ENDOCARDITIS  . VENOUS INSUFFICIENCY, CHRONIC  . OTHER OSTEOPOROSIS  . INTERMITTENT VERTIGO  . Neck pain, acute  . Excessive cerumen in ear canal  . Leg cramps  . Edema   Past Medical History  Diagnosis Date  . Hyperlipidemia   . Mitral regurgitation   . Mitral valve prolapse    Past Surgical History  Procedure Date  . Breast biopsy 1980    normal  . Hospitalized for endocarditis 1987   History  Substance Use Topics  . Smoking status: Never Smoker   . Smokeless tobacco: Not on file  . Alcohol Use: Yes     1 drink daily, cocktail   Family History  Problem Relation Age of Onset  . Heart attack Father   . Coronary artery disease Father   . Cancer Sister     esophageal   Allergies  Allergen Reactions  . Codeine     REACTION: N \\T \ V  . Statins     REACTION: INTOLERANCE: muscle aches  . Sulfonamide Derivatives     REACTION: N \\T \ V   Current Outpatient Prescriptions on File Prior to Visit  Medication Sig Dispense Refill  . aspirin 81 MG tablet Take 81 mg by mouth daily. Takes three times a week       . calcitonin, salmon, (MIACALCIN/FORTICAL) 200 UNIT/ACT nasal spray USE 1 SPRAY PER DAY ALTERNATING NOSTRILS  3.7 mL  5  . Multiple Vitamin (MULTIVITAMIN) tablet Take 1 tablet by mouth daily.         The PMH, PSH, Social History, Family History, Medications, and allergies have  been reviewed in [facility], and have been updated if relevant.    Review of Systems See HPI     Objective:   Physical Exam BP 118/62  Pulse 72  Temp(Src) 98.7 F (37.1 C) (Oral)  Ht 5' 1.5" (1.562 m)  Wt 114 lb 4 oz (51.823 kg)  BMI 21.24 kg/m2 Gen:  Alert, pleasant, NAD Skin:  1.5 cm raised erythematous area on right chin, no pustules    Assessment & Plan:   1. Dermatitis due to plants, including poison ivy, sumac, and oak    New.  Very small area and no signs of super infection so topical steroid should be sufficient. Will try lidex. See pt instructions for details.

## 2011-07-08 ENCOUNTER — Other Ambulatory Visit (INDEPENDENT_AMBULATORY_CARE_PROVIDER_SITE_OTHER): Payer: Medicare Other | Admitting: *Deleted

## 2011-07-08 DIAGNOSIS — I340 Nonrheumatic mitral (valve) insufficiency: Secondary | ICD-10-CM

## 2011-07-08 DIAGNOSIS — I059 Rheumatic mitral valve disease, unspecified: Secondary | ICD-10-CM

## 2011-07-13 ENCOUNTER — Encounter: Payer: Self-pay | Admitting: Cardiovascular Disease

## 2011-07-31 ENCOUNTER — Other Ambulatory Visit (INDEPENDENT_AMBULATORY_CARE_PROVIDER_SITE_OTHER): Payer: Medicare Other

## 2011-07-31 DIAGNOSIS — E785 Hyperlipidemia, unspecified: Secondary | ICD-10-CM

## 2011-07-31 LAB — LIPID PANEL
Cholesterol: 248 mg/dL — ABNORMAL HIGH (ref 0–200)
Total CHOL/HDL Ratio: 4
Triglycerides: 84 mg/dL (ref 0.0–149.0)
VLDL: 16.8 mg/dL (ref 0.0–40.0)

## 2011-07-31 LAB — COMPREHENSIVE METABOLIC PANEL
Alkaline Phosphatase: 66 U/L (ref 39–117)
BUN: 16 mg/dL (ref 6–23)
CO2: 30 mEq/L (ref 19–32)
GFR: 67.15 mL/min (ref >60.00)
Glucose, Bld: 91 mg/dL (ref 70–99)
Sodium: 140 mEq/L (ref 135–145)
Total Bilirubin: 0.7 mg/dL (ref 0.3–1.2)
Total Protein: 6.3 g/dL (ref 6.0–8.3)

## 2011-08-07 ENCOUNTER — Encounter: Payer: Self-pay | Admitting: Family Medicine

## 2011-08-07 ENCOUNTER — Ambulatory Visit (INDEPENDENT_AMBULATORY_CARE_PROVIDER_SITE_OTHER): Payer: Medicare Other | Admitting: Family Medicine

## 2011-08-07 VITALS — BP 130/64 | HR 64 | Temp 97.6°F | Ht 60.0 in | Wt 113.5 lb

## 2011-08-07 DIAGNOSIS — Z0000 Encounter for general adult medical examination without abnormal findings: Secondary | ICD-10-CM

## 2011-08-07 DIAGNOSIS — I10 Essential (primary) hypertension: Secondary | ICD-10-CM

## 2011-08-07 DIAGNOSIS — E785 Hyperlipidemia, unspecified: Secondary | ICD-10-CM

## 2011-08-07 DIAGNOSIS — R21 Rash and other nonspecific skin eruption: Secondary | ICD-10-CM

## 2011-08-07 DIAGNOSIS — M818 Other osteoporosis without current pathological fracture: Secondary | ICD-10-CM

## 2011-08-07 MED ORDER — EZETIMIBE 10 MG PO TABS: 10.0000 mg | ORAL_TABLET | Freq: Every day | ORAL | Status: DC

## 2011-08-07 NOTE — Patient Instructions (Addendum)
Trial of claritin for nasal symptoms.. Call if not improving in next few weeks. Get back on track with diet, avoid animal fats.  Add back zetia for cholesterol control. Stop at front desk to set up bone density and dermatology referral Fasting labs in 3 months.  Follow up appt in 6 months.

## 2011-08-07 NOTE — Progress Notes (Signed)
Subjective:    Patient ID: Brandi [name], female    DOB: March 06, 1927, 77 y.o.   MRN: [id number]  HPI I have personally reviewed the Medicare Annual Wellness questionnaire and have noted 1. The patient's medical and social history 2. Their use of alcohol, tobacco or illicit drugs 3. Their current medications and supplements 4. The patient's functional ability including ADL's, fall risks, home safety risks and hearing or visual             impairment. 5. Diet and physical activities 6. Evidence for depression or mood disorders  The patients weight, height, BMI and visual acuity have been recorded in the chart I have made referrals, counseling and provided education to the patient based review of the above and I have provided the pt with a written personalized care plan for preventive services.  Seen 1 month ago for rash on right chin... Blisters resolved, but Red spot remains despite lidex cream BID. (Dr. Dayton Martes and pt thought it was poision ivy from gardening) Has now spreads to left side of chin.  Minimally itchy, although was in beginning.  Hypertension:   Well controlled  Using medication without problems or lightheadedness:  Chest pain with exertion: None Edema:None Short of breath:None Average home BPs: 120/70 Other issues: Saw Dr. Mariah Milling: no changes made.  ECHO 11/20: EF nml. Bileaflet mitral valve prolapse with mild moderate mitral regurgitation.  Minimal change since 2010.   Elevated Cholesterol: Inadequate control, on no med. She feels she is eating a lot more bacon lately.    Diet compliance: moderate Exercise: 3 times a week.. Walking 2 miles and treadmill Other complaints:  Has noted nasal discharge  In AMs, improves later in the day. Some sneeze, occ itchy eyes. Has not tried claritin or other allergy meds.   Review of Systems  Constitutional: Negative for fever and fatigue.  HENT: Negative for ear pain.   Eyes: Negative for pain.  Respiratory: Negative for chest  tightness and shortness of breath.   Cardiovascular: Negative for chest pain, palpitations and leg swelling.  Gastrointestinal: Negative for abdominal pain.  Genitourinary: Negative for dysuria.       Objective:   Physical Exam  Constitutional: Vital signs are normal. She appears well-developed and well-nourished. She is cooperative.  Non-toxic appearance. She does not appear ill. No distress.  HENT:  Head: Normocephalic.  Right Ear: Hearing, tympanic membrane, external ear and ear canal normal.  Left Ear: Hearing, tympanic membrane, external ear and ear canal normal.  Nose: Nose normal.  Eyes: Conjunctivae, EOM and lids are normal. Pupils are equal, round, and reactive to light. No foreign bodies found.  Neck: Trachea normal and normal range of motion. Neck supple. Carotid bruit is not present. No mass and no thyromegaly present.  Cardiovascular: Normal rate, regular rhythm, S1 normal, S2 normal, normal heart sounds and intact distal pulses.  Exam reveals no gallop.   No murmur heard. Pulmonary/Chest: Effort normal and breath sounds normal. No respiratory distress. She has no wheezes. She has no rhonchi. She has no rales.  Abdominal: Soft. Normal appearance and bowel sounds are normal. She exhibits no distension, no fluid wave, no abdominal bruit and no mass. There is no hepatosplenomegaly. There is no tenderness. There is no rebound, no guarding and no CVA tenderness. No hernia.  Lymphadenopathy:    She has no cervical adenopathy.    She has no axillary adenopathy.  Neurological: She is alert. She has normal strength. No cranial nerve deficit or sensory deficit.  Skin: Skin is warm, dry and intact. No rash noted.  Psychiatric: Her speech is normal and behavior is normal. Judgment normal. Her mood appears not anxious. Cognition and memory are normal. She does not exhibit a depressed mood.          Assessment & Plan:  AMW: The patient's preventative maintenance and recommended  screening tests for an annual wellness exam were reviewed in full today. Brought up to date unless services declined.  Counselled on the importance of diet, exercise, and its role in overall health and mortality. The patient's FH and SH was reviewed, including their home life, tobacco status, and drug and alcohol status.   Mammogram: not indicated, she would not be aggressive about treating at this point. DVE/pap: pap  And DVE not indicated  She feels comfortable with not continuing aggressive prevention. COLON: not indicated, no change in stool habits, last one in 2010. Vaccines:uptodate with flu, TDap, shingles, pneumonia. Bone density: on calcitonin. Due for re-eval.

## 2011-08-21 DIAGNOSIS — L719 Rosacea, unspecified: Secondary | ICD-10-CM | POA: Diagnosis not present

## 2011-08-26 ENCOUNTER — Ambulatory Visit: Payer: Self-pay | Admitting: Family Medicine

## 2011-08-26 DIAGNOSIS — M810 Age-related osteoporosis without current pathological fracture: Secondary | ICD-10-CM | POA: Diagnosis not present

## 2011-09-05 NOTE — Assessment & Plan Note (Signed)
Well controlled. Continue current medication.  

## 2011-09-05 NOTE — Assessment & Plan Note (Addendum)
Inadequate control. Encouraged exercise, weight loss, healthy eating habits. Get back on track with diet, avoid animal fats.  Add back zetia for cholesterol control. Fasting labs in 3 months.

## 2011-09-26 ENCOUNTER — Encounter: Payer: Self-pay | Admitting: Family Medicine

## 2011-10-23 ENCOUNTER — Other Ambulatory Visit (INDEPENDENT_AMBULATORY_CARE_PROVIDER_SITE_OTHER): Payer: Medicare Other

## 2011-10-23 DIAGNOSIS — E785 Hyperlipidemia, unspecified: Secondary | ICD-10-CM

## 2011-10-23 LAB — LIPID PANEL
Cholesterol: 184 mg/dL (ref 0–200)
LDL Cholesterol: 106 mg/dL — ABNORMAL HIGH (ref 0–99)
Total CHOL/HDL Ratio: 3
Triglycerides: 90 mg/dL (ref 0.0–149.0)
VLDL: 18 mg/dL (ref 0.0–40.0)

## 2011-11-01 ENCOUNTER — Other Ambulatory Visit: Payer: Self-pay | Admitting: Family Medicine

## 2011-11-04 DIAGNOSIS — H903 Sensorineural hearing loss, bilateral: Secondary | ICD-10-CM | POA: Diagnosis not present

## 2011-11-04 DIAGNOSIS — H6120 Impacted cerumen, unspecified ear: Secondary | ICD-10-CM | POA: Diagnosis not present

## 2011-11-04 DIAGNOSIS — H905 Unspecified sensorineural hearing loss: Secondary | ICD-10-CM | POA: Diagnosis not present

## 2012-01-02 DIAGNOSIS — H35389 Toxic maculopathy, unspecified eye: Secondary | ICD-10-CM | POA: Diagnosis not present

## 2012-01-23 ENCOUNTER — Ambulatory Visit (INDEPENDENT_AMBULATORY_CARE_PROVIDER_SITE_OTHER): Payer: Medicare Other | Admitting: Internal Medicine

## 2012-01-23 ENCOUNTER — Encounter: Payer: Self-pay | Admitting: Internal Medicine

## 2012-01-23 VITALS — BP 140/70 | HR 79 | Temp 97.8°F | Resp 16 | Ht 59.5 in | Wt 113.2 lb

## 2012-01-23 DIAGNOSIS — Z79899 Other long term (current) drug therapy: Secondary | ICD-10-CM

## 2012-01-23 DIAGNOSIS — R5381 Other malaise: Secondary | ICD-10-CM | POA: Diagnosis not present

## 2012-01-23 DIAGNOSIS — E785 Hyperlipidemia, unspecified: Secondary | ICD-10-CM | POA: Diagnosis not present

## 2012-01-23 DIAGNOSIS — I38 Endocarditis, valve unspecified: Secondary | ICD-10-CM | POA: Diagnosis not present

## 2012-01-23 DIAGNOSIS — I080 Rheumatic disorders of both mitral and aortic valves: Secondary | ICD-10-CM

## 2012-01-23 DIAGNOSIS — R5383 Other fatigue: Secondary | ICD-10-CM

## 2012-01-23 LAB — COMPLETE METABOLIC PANEL WITH GFR
ALT: 20 U/L (ref 0–35)
AST: 30 U/L (ref 0–37)
CO2: 26 mEq/L (ref 19–32)
Chloride: 105 mEq/L (ref 96–112)
GFR, Est African American: 89 mL/min
Sodium: 139 mEq/L (ref 135–145)
Total Bilirubin: 0.5 mg/dL (ref 0.3–1.2)
Total Protein: 6.4 g/dL (ref 6.0–8.3)

## 2012-01-23 LAB — CBC WITH DIFFERENTIAL/PLATELET
Basophils Relative: 0.5 % (ref 0.0–3.0)
Eosinophils Absolute: 0.1 10*3/uL (ref 0.0–0.7)
HCT: 43.3 % (ref 36.0–46.0)
Lymphs Abs: 1.4 10*3/uL (ref 0.7–4.0)
MCHC: 33.1 g/dL (ref 30.0–36.0)
MCV: 95.3 fl (ref 78.0–100.0)
Monocytes Absolute: 0.7 10*3/uL (ref 0.1–1.0)
Neutro Abs: 3.5 10*3/uL (ref 1.4–7.7)
Neutrophils Relative %: 61.5 % (ref 43.0–77.0)
RBC: 4.55 Mil/uL (ref 3.87–5.11)

## 2012-01-23 MED ORDER — CALCITONIN (SALMON) 200 UNIT/ACT NA SOLN: 1.0000 | Freq: Every day | NASAL | Status: DC

## 2012-01-23 MED ORDER — EZETIMIBE 10 MG PO TABS: 10.0000 mg | ORAL_TABLET | Freq: Every day | ORAL | Status: DC

## 2012-01-23 MED ORDER — AMOXICILLIN 500 MG PO TABS: 2000.0000 mg | ORAL_TABLET | Freq: Once | ORAL | Status: DC

## 2012-01-23 NOTE — Assessment & Plan Note (Signed)
Mitral valve prolapse,  Occurred after a dental procedure. Continue amoxicillin pre procedure.

## 2012-01-23 NOTE — Assessment & Plan Note (Addendum)
Managed with zetia with reduction of LDL by nearly 60 pts.  No myalgias,  LFTs normal today. Will ree\peat lipids in 6 months

## 2012-01-23 NOTE — Progress Notes (Addendum)
Patient ID: Brandi [name], female   DOB: 1926-10-07, 78 y.o.   MRN: [id number]  Patient Active Problem List  Diagnosis  . HERPES ZOSTER  . HYPERLIPIDEMIA  . HEARING LOSS, TINNITUS  . MITRAL REGURGITATION  . ESSENTIAL HYPERTENSION, BENIGN  . ENDOCARDITIS  . VENOUS INSUFFICIENCY, CHRONIC  . OTHER OSTEOPOROSIS  . INTERMITTENT VERTIGO  . Neck pain, acute  . Excessive cerumen in ear canal  . Leg cramps  . Edema  . Dermatitis due to plants, including poison ivy, sumac, and oak    Subjective:  CC:   Chief Complaint  Patient presents with  . New Patient    HPI:     Brandi [name] a 78 y.o. female who presents as a new patient referred by [facility] residents,  Brandi [name] et al for primary care.  She is a healthy, active elderly female who has exercised daily for years.  Aerobics,  Zumba,  Walking,  Weight lifting.  Started years ago when she lived at [facility] and went to the wellness center.  No stretching classes .  No chronic pain issues.   Lives with husband.  Moved to [place] from [place], [place]  in 1985, husband is ten years older than her and has significant health issues.   Past Medical History  Diagnosis Date  . Hyperlipidemia   . Mitral regurgitation   . Mitral valve prolapse     Past Surgical History  Procedure Date  . Breast biopsy 1980    normal  . Hospitalized for endocarditis 1987  . Biopsy breast 1980    left ,  benign      Family history:  positive for Esophageal  Cancer in sister ,  No early heart disease.   The following portions of the patient's history were reviewed and updated as appropriate: Allergies, current medications, and problem list.    Review of Systemt   No weight loss, shortness of breath,  Fevers chill or joint pain  All other systems reviewed and negative.,     History   Social History  . Marital Status: Married    Spouse Name: N/A    Number of Children: 0  . Years of Education: N/A   Occupational History  . retired      Social History Main Topics  . Smoking status: Never Smoker   . Smokeless tobacco: Never Used  . Alcohol Use: Yes     1 drink daily, cocktail  . Drug Use: No  . Sexually Active: Not on file   Other Topics Concern  . Not on file   Social History Narrative   [facility] [facility] husband: Brandi [name] living will...has DNR order.    Objective:  BP 140/70  Pulse 79  Temp 97.8 F (36.6 C) (Oral)  Resp 16  Ht 4' 11.5" (1.511 m)  Wt 113 lb 4 oz (51.37 kg)  BMI 22.49 kg/m2  SpO2 98%  General appearance: alert, cooperative and appears stated age Ears: normal TM's and external ear canals both ears Throat: lips, mucosa, and tongue normal; teeth and gums normal Neck: no adenopathy, no carotid bruit, supple, symmetrical, trachea midline and thyroid not enlarged, symmetric, no tenderness/mass/nodules Back: symmetric, no curvature. ROM normal. No CVA tenderness. Lungs: clear to auscultation bilaterally Heart: regular rate and rhythm, S1, S2 normal, no murmur, click, rub or gallop Abdomen: soft, non-tender; bowel sounds normal; no masses,  no organomegaly Pulses: 2+ and symmetric Skin: Skin color,  texture, turgor normal. No rashes or lesions Lymph nodes: Cervical, supraclavicular, and axillary nodes normal.

## 2012-01-23 NOTE — Patient Instructions (Addendum)
[employer] makes a low carb whole wheat tortilla that has 26 grams of fiber . (Dailly recommended goal is 25 grams or more) .  You can find it at [employer] and [employer].  Return in 6 months for fasting labs and office visit  We will call you with the results of your labs today

## 2012-01-25 ENCOUNTER — Encounter: Payer: Self-pay | Admitting: Internal Medicine

## 2012-01-25 NOTE — Assessment & Plan Note (Signed)
With history of endocarditis .  contineu preprocedure prophylactic antibiotics.  Consider ACE Inhibitor for afterload reduction.

## 2012-01-26 ENCOUNTER — Telehealth: Payer: Self-pay | Admitting: Internal Medicine

## 2012-01-26 DIAGNOSIS — Z1239 Encounter for other screening for malignant neoplasm of breast: Secondary | ICD-10-CM

## 2012-01-26 NOTE — Telephone Encounter (Signed)
Order in epic. 

## 2012-01-26 NOTE — Telephone Encounter (Signed)
Patient called and stated you had discussed with her about getting a mammogram, she stated then she declined but now she wants to proceed with the mammogram.  Please advise.

## 2012-01-28 NOTE — Telephone Encounter (Signed)
Patient notified, she will pick up order

## 2012-02-06 ENCOUNTER — Ambulatory Visit: Payer: Medicare Other | Admitting: Family Medicine

## 2012-02-20 DIAGNOSIS — H905 Unspecified sensorineural hearing loss: Secondary | ICD-10-CM | POA: Diagnosis not present

## 2012-02-20 DIAGNOSIS — H6980 Other specified disorders of Eustachian tube, unspecified ear: Secondary | ICD-10-CM | POA: Diagnosis not present

## 2012-02-20 DIAGNOSIS — H9319 Tinnitus, unspecified ear: Secondary | ICD-10-CM | POA: Diagnosis not present

## 2012-02-24 ENCOUNTER — Ambulatory Visit: Payer: Self-pay | Admitting: Internal Medicine

## 2012-02-24 DIAGNOSIS — Z1231 Encounter for screening mammogram for malignant neoplasm of breast: Secondary | ICD-10-CM | POA: Diagnosis not present

## 2012-02-25 ENCOUNTER — Telehealth: Payer: Self-pay | Admitting: Internal Medicine

## 2012-02-25 NOTE — Telephone Encounter (Signed)
Her mammogram was normal.  Repeat in one year 

## 2012-02-26 NOTE — Telephone Encounter (Signed)
Patient notified

## 2012-02-26 NOTE — Telephone Encounter (Signed)
Patients husband stated patient would call me back.

## 2012-03-09 ENCOUNTER — Encounter: Payer: Self-pay | Admitting: Internal Medicine

## 2012-03-12 DIAGNOSIS — H6980 Other specified disorders of Eustachian tube, unspecified ear: Secondary | ICD-10-CM | POA: Diagnosis not present

## 2012-03-12 DIAGNOSIS — H9319 Tinnitus, unspecified ear: Secondary | ICD-10-CM | POA: Diagnosis not present

## 2012-03-12 DIAGNOSIS — H905 Unspecified sensorineural hearing loss: Secondary | ICD-10-CM | POA: Diagnosis not present

## 2012-06-15 DIAGNOSIS — Z23 Encounter for immunization: Secondary | ICD-10-CM | POA: Diagnosis not present

## 2012-07-23 ENCOUNTER — Other Ambulatory Visit: Payer: Self-pay | Admitting: Internal Medicine

## 2012-07-23 ENCOUNTER — Other Ambulatory Visit (INDEPENDENT_AMBULATORY_CARE_PROVIDER_SITE_OTHER): Payer: Medicare Other

## 2012-07-23 ENCOUNTER — Telehealth: Payer: Self-pay | Admitting: *Deleted

## 2012-07-23 DIAGNOSIS — Z79899 Other long term (current) drug therapy: Secondary | ICD-10-CM

## 2012-07-23 DIAGNOSIS — E785 Hyperlipidemia, unspecified: Secondary | ICD-10-CM

## 2012-07-23 DIAGNOSIS — E559 Vitamin D deficiency, unspecified: Secondary | ICD-10-CM

## 2012-07-23 DIAGNOSIS — M810 Age-related osteoporosis without current pathological fracture: Secondary | ICD-10-CM

## 2012-07-23 LAB — COMPREHENSIVE METABOLIC PANEL
ALT: 25 U/L (ref 0–35)
AST: 34 U/L (ref 0–37)
CO2: 30 mEq/L (ref 19–32)
Creatinine, Ser: 0.8 mg/dL (ref 0.4–1.2)
GFR: 68.83 mL/min (ref >60.00)
Total Bilirubin: 0.6 mg/dL (ref 0.3–1.2)

## 2012-07-23 LAB — LIPID PANEL
HDL: 56.5 mg/dL (ref >39.00)
LDL Cholesterol: 107 mg/dL — ABNORMAL HIGH (ref 0–99)
Total CHOL/HDL Ratio: 3
Triglycerides: 68 mg/dL (ref 0.0–149.0)

## 2012-07-23 NOTE — Telephone Encounter (Signed)
What labs and diagnostic code would you like for this patient?

## 2012-07-28 ENCOUNTER — Ambulatory Visit (INDEPENDENT_AMBULATORY_CARE_PROVIDER_SITE_OTHER): Payer: Medicare Other | Admitting: Internal Medicine

## 2012-07-28 ENCOUNTER — Encounter: Payer: Self-pay | Admitting: Internal Medicine

## 2012-07-28 VITALS — BP 116/58 | HR 58 | Temp 97.7°F | Resp 12 | Ht 59.0 in | Wt 114.5 lb

## 2012-07-28 DIAGNOSIS — Z1331 Encounter for screening for depression: Secondary | ICD-10-CM

## 2012-07-28 DIAGNOSIS — E785 Hyperlipidemia, unspecified: Secondary | ICD-10-CM | POA: Diagnosis not present

## 2012-07-28 DIAGNOSIS — M818 Other osteoporosis without current pathological fracture: Secondary | ICD-10-CM | POA: Diagnosis not present

## 2012-07-28 MED ORDER — EZETIMIBE 10 MG PO TABS: 10.0000 mg | ORAL_TABLET | Freq: Every day | ORAL | Status: DC

## 2012-07-28 MED ORDER — CALCITONIN (SALMON) 200 UNIT/ACT NA SOLN: 1.0000 | Freq: Every day | NASAL | Status: DC

## 2012-07-28 NOTE — Patient Instructions (Addendum)
You are doing great!  Keep walking and taking your medications  We will see you in 6 months for your annual physical

## 2012-07-30 ENCOUNTER — Ambulatory Visit: Payer: BLUE CROSS/BLUE SHIELD | Admitting: Internal Medicine

## 2012-07-31 NOTE — Assessment & Plan Note (Addendum)
Managed with Zetia, which reduced LDL from 164-107. HDL remains elevated at 56. Liver function tests are normal. No changes today.

## 2012-07-31 NOTE — Progress Notes (Signed)
Patient ID: Brandi [name], female   DOB: 10/02/1926, 77 y.o.   MRN: [id number]  Patient Active Problem List  Diagnosis  . HERPES ZOSTER  . HYPERLIPIDEMIA  . HEARING LOSS, TINNITUS  . MITRAL REGURGITATION  . ESSENTIAL HYPERTENSION, BENIGN  . ENDOCARDITIS  . VENOUS INSUFFICIENCY, CHRONIC  . Other osteoporosis  . INTERMITTENT VERTIGO  . Neck pain, acute  . Excessive cerumen in ear canal  . Leg cramps  . Edema  . Dermatitis due to plants, including poison ivy, sumac, and oak    Subjective:  CC:   Chief Complaint  Patient presents with  . Follow-up    HPI:   Brandi [name] a 78 y.o. female who presents for 6 month follow up on chronic conditions including hyperlipidemia and osteoporosis. She is tolerating her medications without side effects specifically she has no myalgias or muscle weakness with use of Zetia. She continues to use her calcitonin nasal spray for prevention of osteoporosis and history of intolerance to other medications are she has no new complaints today. She has not had any falls. She remains physically quite active.   Past Medical History  Diagnosis Date  . Hyperlipidemia   . Mitral regurgitation   . Mitral valve prolapse     Past Surgical History  Procedure Date  . Breast biopsy 1980    normal  . Hospitalized for endocarditis 1987  . Biopsy breast 1980    left ,  benign         The following portions of the patient's history were reviewed and updated as appropriate: Allergies, current medications, and problem list.    Review of Systems:  Patient denies headache, fevers, malaise, unintentional weight loss, skin rash, eye pain, sinus congestion and sinus pain, sore throat, dysphagia,  hemoptysis , cough, dyspnea, wheezing, chest pain, palpitations, orthopnea, edema, abdominal pain, nausea, melena, diarrhea, constipation, flank pain, dysuria, hematuria, urinary  Frequency, nocturia, numbness, tingling, seizures,  Focal weakness, Loss of consciousness,   Tremor, insomnia, depression, anxiety, and suicidal ideation.       History   Social History  . Marital Status: Married    Spouse Name: N/A    Number of Children: 0  . Years of Education: N/A   Occupational History  . retired    Social History Main Topics  . Smoking status: Never Smoker   . Smokeless tobacco: Never Used  . Alcohol Use: Yes     Comment: 1 drink daily, cocktail  . Drug Use: No  . Sexually Active: Not on file   Other Topics Concern  . Not on file   Social History Narrative   [place] [place] [facility] [place] [facility]: husband: Detra Bores living will...has DNR order.    Objective:  BP 116/58  Pulse 58  Temp 97.7 F (36.5 C) (Oral)  Resp 12  Ht 4\' 11"  (1.499 m)  Wt 114 lb 8 oz (51.937 kg)  BMI 23.13 kg/m2  SpO2 99%  General appearance: alert, cooperative and appears stated age Ears: normal TM's and external ear canals both ears Throat: lips, mucosa, and tongue normal; teeth and gums normal Neck: no adenopathy, no carotid bruit, supple, symmetrical, trachea midline and thyroid not enlarged, symmetric, no tenderness/mass/nodules Back: symmetric, no curvature. ROM normal. No CVA tenderness. Lungs: clear to auscultation bilaterally Heart: regular rate and rhythm, S1, S2 normal, no murmur, click, rub or gallop Abdomen: soft, non-tender; bowel sounds normal; no masses,  no organomegaly Pulses: 2+ and symmetric Skin: Skin  color, texture, turgor normal. No rashes or lesions Lymph nodes: Cervical, supraclavicular, and axillary nodes normal.  Assessment and Plan:  HYPERLIPIDEMIA Managed with Zetia, which reduced LDL from 164-107. HDL remains elevated at 56. Liver function tests are normal. No changes today.  Other osteoporosis Currently managed with calcitonin nasal spray. She's aware that this is not first line therapy but has had multiple intolerances to other medications. Last DEXA scan was in 2011 and scores had  improved.   Updated Medication List Outpatient Encounter Prescriptions as of 07/28/2012  Medication Sig Dispense Refill  . aspirin 81 MG tablet Take 81 mg by mouth daily. Takes three times a week       . calcitonin, salmon, (MIACALCIN/FORTICAL) 200 UNIT/ACT nasal spray Place 1 spray into the nose daily. PATIENT REQUESTING 90 DAYS SUPPLY IF MEDICATION WILL KEEP THAT LONG IN REFRIGERATOR  11.1 mL  5  . calcium carbonate (OS-CAL) 600 MG TABS Take 1,200 mg by mouth daily.      [name] ezetimibe (ZETIA) 10 MG tablet Take 1 tablet (10 mg total) by mouth daily.  90 tablet  2  . Multiple Vitamin (MULTIVITAMIN) tablet Take 1 tablet by mouth daily.        . [DISCONTINUED] calcitonin, salmon, (MIACALCIN/FORTICAL) 200 UNIT/ACT nasal spray Place 1 spray into the nose daily. PATIENT REQUESTING 90 DAYS SUPPLY IF MEDICATION WILL KEEP THAT LONG IN REFRIGERATOR  11.1 mL  5  . [DISCONTINUED] ezetimibe (ZETIA) 10 MG tablet Take 1 tablet (10 mg total) by mouth daily.  90 tablet  2  . amoxicillin (AMOXIL) 500 MG tablet Take 4 tablets (2,000 mg total) by mouth once. Prior to dental work  4 tablet  2     No orders of the defined types were placed in this encounter.    No Follow-up on file.

## 2012-07-31 NOTE — Assessment & Plan Note (Signed)
Currently managed with calcitonin nasal spray. She's aware that this is not first line therapy but has had multiple intolerances to other medications. Last DEXA scan was in 2011 and scores had improved.

## 2012-08-20 DIAGNOSIS — Z23 Encounter for immunization: Secondary | ICD-10-CM | POA: Diagnosis not present

## 2012-08-27 ENCOUNTER — Ambulatory Visit (INDEPENDENT_AMBULATORY_CARE_PROVIDER_SITE_OTHER): Payer: Medicare Other | Admitting: Cardiovascular Disease

## 2012-08-27 ENCOUNTER — Encounter: Payer: Self-pay | Admitting: Cardiovascular Disease

## 2012-08-27 VITALS — BP 138/72 | HR 61 | Ht 61.5 in | Wt 113.8 lb

## 2012-08-27 DIAGNOSIS — R609 Edema, unspecified: Secondary | ICD-10-CM

## 2012-08-27 DIAGNOSIS — E785 Hyperlipidemia, unspecified: Secondary | ICD-10-CM | POA: Diagnosis not present

## 2012-08-27 DIAGNOSIS — I080 Rheumatic disorders of both mitral and aortic valves: Secondary | ICD-10-CM | POA: Diagnosis not present

## 2012-08-27 DIAGNOSIS — I10 Essential (primary) hypertension: Secondary | ICD-10-CM | POA: Diagnosis not present

## 2012-08-27 NOTE — Assessment & Plan Note (Signed)
Likely venous insufficiency worse in the summertime. We have recommended compression hose for symptom relief only

## 2012-08-27 NOTE — Assessment & Plan Note (Signed)
Clinical exam is essentially benign. Murmur is less prominent today. No further testing needed.

## 2012-08-27 NOTE — Assessment & Plan Note (Signed)
Cholesterol has significantly improved on her current medications. No changes made

## 2012-08-27 NOTE — Patient Instructions (Addendum)
You are doing well. No medication changes were made.  Please call us if you have new issues that need to be addressed before your next appt.  Your physician wants you to follow-up in: 12 months.  You will receive a reminder letter in the mail two months in advance. If you don't receive a letter, please call our office to schedule the follow-up appointment. 

## 2012-08-27 NOTE — Assessment & Plan Note (Signed)
Blood pressure is well controlled on today's visit. No changes made to the medications. 

## 2012-08-27 NOTE — Progress Notes (Signed)
Patient ID: Brandi [name], female    DOB: 17-Feb-1927, 79 y.o.   MRN: [id number]  HPI Comments: 79 year old woman with anterior leaflet MVP and mild to moderate MR, Hyperlipidemia who presents for routine followup. Mild aortic valve stenosis   Overall she states that she is doing well. She is active, exercises 3 times per week and also uses weights. She denies any shortness of breath, chest pain. She did have some very mild lower extremity edema in her ankles over the summer though this has disappeared. She is tolerating zetia with a 70 point drop in her cholesterol from prior measurements.    mother passed away at age 11.  father did pass away at age 15 from a large MI, others in her family have lived into their 4s.  2-D echocardiogram performed May 08, 2009 showed normal left ventricular systolic function. Bileaflet mitral valve prolapse with moderate mitral regurgitation.    EKG shows normal sinus rhythm with rate 61 beats per minute, no significant ST or T wave changes, rare PVCs      Outpatient Encounter Prescriptions as of 08/27/2012  Medication Sig Dispense Refill  . amoxicillin (AMOXIL) 500 MG tablet Take 4 tablets (2,000 mg total) by mouth once. Prior to dental work  4 tablet  2  . aspirin 81 MG tablet Takes three times a week      . calcitonin, salmon, (MIACALCIN/FORTICAL) 200 UNIT/ACT nasal spray Place 1 spray into the nose daily. PATIENT REQUESTING 90 DAYS SUPPLY IF MEDICATION WILL KEEP THAT LONG IN REFRIGERATOR  11.1 mL  5  . calcium carbonate (OS-CAL) 600 MG TABS Take 1,200 mg by mouth daily.      [name] ezetimibe (ZETIA) 10 MG tablet Take 1 tablet (10 mg total) by mouth daily.  90 tablet  2  . Multiple Vitamin (MULTIVITAMIN) tablet Take 1 tablet by mouth daily.          Review of Systems  Constitutional: Negative.   HENT: Negative.   Eyes: Negative.   Respiratory: Negative.   Cardiovascular: Positive for leg swelling.       Edema over the summer  Gastrointestinal:  Negative.   Musculoskeletal: Negative.   Skin: Negative.   Neurological: Negative.   Hematological: Negative.   Psychiatric/Behavioral: Negative.   All other systems reviewed and are negative.   BP 138/72  Pulse 61  Ht 5' 1.5" (1.562 m)  Wt 113 lb 12 oz (51.597 kg)  BMI 21.14 kg/m2  Physical Exam  Nursing note and vitals reviewed. Constitutional: She is oriented to person, place, and time. She appears well-developed and well-nourished.  HENT:  Head: Normocephalic.  Nose: Nose normal.  Mouth/Throat: Oropharynx is clear and moist.  Eyes: Conjunctivae normal are normal. Pupils are equal, round, and reactive to light.  Neck: Normal range of motion. Neck supple. No JVD present.  Cardiovascular: Normal rate, regular rhythm, S1 normal, S2 normal and intact distal pulses.  Frequent extrasystoles are present. Exam reveals no gallop and no friction rub.   Murmur heard.  Crescendo systolic murmur is present with a grade of 3/6  Pulmonary/Chest: Effort normal and breath sounds normal. No respiratory distress. She has no wheezes. She has no rales. She exhibits no tenderness.  Abdominal: Soft. Bowel sounds are normal. She exhibits no distension. There is no tenderness.  Musculoskeletal: Normal range of motion. She exhibits no edema and no tenderness.  Lymphadenopathy:    She has no cervical adenopathy.  Neurological: She is alert and oriented to person,  place, and time. Coordination normal.  Skin: Skin is warm and dry. No rash noted. No erythema.  Psychiatric: She has a normal mood and affect. Her behavior is normal. Judgment and thought content normal.         Assessment and Plan

## 2012-10-02 ENCOUNTER — Other Ambulatory Visit: Payer: Self-pay

## 2012-12-16 DIAGNOSIS — M7150 Other bursitis, not elsewhere classified, unspecified site: Secondary | ICD-10-CM | POA: Diagnosis not present

## 2013-01-13 DIAGNOSIS — H2510 Age-related nuclear cataract, unspecified eye: Secondary | ICD-10-CM | POA: Diagnosis not present

## 2013-01-26 ENCOUNTER — Encounter: Payer: Self-pay | Admitting: Internal Medicine

## 2013-01-26 ENCOUNTER — Ambulatory Visit (INDEPENDENT_AMBULATORY_CARE_PROVIDER_SITE_OTHER): Payer: Medicare Other | Admitting: Internal Medicine

## 2013-01-26 VITALS — BP 118/68 | HR 71 | Temp 98.2°F | Resp 14 | Ht 60.0 in | Wt 108.2 lb

## 2013-01-26 DIAGNOSIS — Z1211 Encounter for screening for malignant neoplasm of colon: Secondary | ICD-10-CM

## 2013-01-26 DIAGNOSIS — Z0000 Encounter for general adult medical examination without abnormal findings: Secondary | ICD-10-CM

## 2013-01-26 DIAGNOSIS — Z01419 Encounter for gynecological examination (general) (routine) without abnormal findings: Secondary | ICD-10-CM

## 2013-01-26 DIAGNOSIS — Z79899 Other long term (current) drug therapy: Secondary | ICD-10-CM

## 2013-01-26 DIAGNOSIS — E785 Hyperlipidemia, unspecified: Secondary | ICD-10-CM | POA: Diagnosis not present

## 2013-01-26 NOTE — Patient Instructions (Addendum)
You had your annual Medicare wellness exam today  Please use the stool kit to send [place] back a sample to test for blood.  This is your colon CA screening test.   You  Are up to date on all vaccines  Please make an appt for fasting bloodwork with the front desk .  We will contact you with the bloodwork results  Return to see me in 6 months

## 2013-01-26 NOTE — Progress Notes (Signed)
Patient ID: Brandi [name], female   DOB: [birth year], 79 y.o.   MRN: [id number]  The patient is here for annual Medicare wellness examination and management of other chronic and acute problems.  Taking care of husband full time, who is cared for by Dr. Alphonsus Sias   The risk factors are reflected in the social history.  The roster of all physicians providing medical care to patient - is listed in the Snapshot section of the chart.  Activities of daily living:  The patient is 100% independent in all ADLs: dressing, toileting, feeding as well as independent mobility  Home safety : The patient has smoke detectors in the home. They wear seatbelts.  There are no firearms at home. There is no violence in the home.   There is no risks for hepatitis, STDs or HIV. There is no   history of blood transfusion. They have no travel history to infectious disease endemic areas of the world.  The patient has seen their dentist in the last six month. They have seen their eye doctor in the last year. They admit to slight hearing difficulty with regard to whispered voices and some television programs.  They have deferred audiologic testing in the last year.  They do not  have excessive sun exposure. Discussed the need for sun protection: hats, long sleeves and use of sunscreen if there is significant sun exposure.   Diet: the importance of a healthy diet is discussed. They do have a healthy diet.  The benefits of regular aerobic exercise were discussed. She walks 4 times per week ,  20 minutes.   Depression screen: there are no signs or vegative symptoms of depression- irritability, change in appetite, anhedonia, sadness/tearfullness.  Cognitive assessment: the patient manages all their financial and personal affairs and is actively engaged. They could relate day,date,year and events; recalled 2/3 objects at 3 minutes; performed clock-face test normally.  The following portions of the patient's history were reviewed and  updated as appropriate: allergies, current medications, past family history, past medical history,  past surgical history, past social history  and problem list.  Visual acuity was not assessed per patient preference since she has regular follow up with her ophthalmologist. Hearing and body mass index were assessed and reviewed.   During the course of the visit the patient was educated and counseled about appropriate screening and preventive services including : fall prevention , diabetes screening, nutrition counseling, colorectal cancer screening, and recommended immunizations.    Objective:  BP 118/68  Pulse 71  Temp(Src) 98.2 F (36.8 C) (Oral)  Resp 14  Ht 5' (1.524 m)  Wt 108 lb 4 oz (49.102 kg)  BMI 21.14 kg/m2  SpO2 97%  General Appearance:    Alert, cooperative, no distress, appears stated age  Head:    Normocephalic, without obvious abnormality, atraumatic  Eyes:    PERRL, conjunctiva/corneas clear, EOM's intact, fundi    benign, both eyes  Ears:    Normal TM's and external ear canals, both ears  Nose:   Nares normal, septum midline, mucosa normal, no drainage    or sinus tenderness  Throat:   Lips, mucosa, and tongue normal; teeth and gums normal  Neck:   Supple, symmetrical, trachea midline, no adenopathy;    thyroid:  no enlargement/tenderness/nodules; no carotid   bruit or JVD  Back:     Symmetric, no curvature, ROM normal, no CVA tenderness  Lungs:     Clear to auscultation bilaterally, respirations unlabored  Chest Wall:  No tenderness or deformity   Heart:    Regular rate and rhythm, S1 and S2 normal, no murmur, rub   or gallop  Breast Exam:    No tenderness, masses, or nipple abnormality  Abdomen:     Soft, non-tender, bowel sounds active all four quadrants,    no masses, no organomegaly  Genitalia:    Pelvic: cervix normal in appearance, external genitalia normal, no adnexal masses or tenderness, no cervical motion tenderness, rectovaginal septum normal,  uterus normal size, shape, and consistency and vagina normal without discharge  Extremities:   Extremities normal, atraumatic, no cyanosis or edema  Pulses:   2+ and symmetric all extremities  Skin:   Skin color, texture, turgor normal, no rashes or lesions  Lymph nodes:   Cervical, supraclavicular, and axillary nodes normal  Neurologic:   CNII-XII intact, normal strength, sensation and reflexes    throughout   Assessment and Plan:  Routine general medical examination at a health care facility Annual exam including breast , pelvic  And rectal exam were done today.  She has deferred mammogram.     Updated Medication List Outpatient Encounter Prescriptions as of 01/26/2013  Medication Sig Dispense Refill  . amoxicillin (AMOXIL) 500 MG tablet Take 4 tablets (2,000 mg total) by mouth once. Prior to dental work  4 tablet  2  . aspirin 81 MG tablet Takes three times a week      . calcitonin, salmon, (MIACALCIN/FORTICAL) 200 UNIT/ACT nasal spray Place 1 spray into the nose daily. PATIENT REQUESTING 90 DAYS SUPPLY IF MEDICATION WILL KEEP THAT LONG IN REFRIGERATOR  11.1 mL  5  . ezetimibe (ZETIA) 10 MG tablet Take 1 tablet (10 mg total) by mouth daily.  90 tablet  2  . Multiple Vitamin (MULTIVITAMIN) tablet Take 1 tablet by mouth daily.        . calcium carbonate (OS-CAL) 600 MG TABS Take 1,200 mg by mouth daily.       No facility-administered encounter medications on file as of 01/26/2013.

## 2013-01-27 NOTE — Assessment & Plan Note (Signed)
Annual exam including breast , pelvic  And rectal exam were done today.  She has deferred mammogram.

## 2013-01-28 ENCOUNTER — Other Ambulatory Visit: Payer: Self-pay | Admitting: *Deleted

## 2013-01-28 ENCOUNTER — Other Ambulatory Visit (INDEPENDENT_AMBULATORY_CARE_PROVIDER_SITE_OTHER): Payer: Medicare Other

## 2013-01-28 DIAGNOSIS — Z139 Encounter for screening, unspecified: Secondary | ICD-10-CM

## 2013-01-28 LAB — FECAL OCCULT BLOOD, IMMUNOCHEMICAL: Fecal Occult Bld: NEGATIVE

## 2013-01-30 ENCOUNTER — Encounter: Payer: Self-pay | Admitting: Internal Medicine

## 2013-02-03 ENCOUNTER — Other Ambulatory Visit (INDEPENDENT_AMBULATORY_CARE_PROVIDER_SITE_OTHER): Payer: Medicare Other

## 2013-02-03 DIAGNOSIS — Z79899 Other long term (current) drug therapy: Secondary | ICD-10-CM | POA: Diagnosis not present

## 2013-02-03 DIAGNOSIS — E785 Hyperlipidemia, unspecified: Secondary | ICD-10-CM | POA: Diagnosis not present

## 2013-02-03 LAB — LIPID PANEL: Total CHOL/HDL Ratio: 3

## 2013-02-03 LAB — COMPREHENSIVE METABOLIC PANEL
ALT: 24 U/L (ref 0–35)
AST: 37 U/L (ref 0–37)
Albumin: 3.6 g/dL (ref 3.5–5.2)
Calcium: 9.1 mg/dL (ref 8.4–10.5)
Chloride: 106 mEq/L (ref 96–112)
Potassium: 4 mEq/L (ref 3.5–5.1)
Sodium: 139 mEq/L (ref 135–145)

## 2013-02-04 ENCOUNTER — Encounter: Payer: Self-pay | Admitting: Internal Medicine

## 2013-02-21 ENCOUNTER — Telehealth: Payer: Self-pay | Admitting: Family Medicine

## 2013-02-22 NOTE — Telephone Encounter (Signed)
Opened in error

## 2013-03-23 ENCOUNTER — Other Ambulatory Visit: Payer: Self-pay

## 2013-03-29 ENCOUNTER — Ambulatory Visit: Payer: Self-pay | Admitting: Internal Medicine

## 2013-03-29 DIAGNOSIS — R928 Other abnormal and inconclusive findings on diagnostic imaging of breast: Secondary | ICD-10-CM | POA: Diagnosis not present

## 2013-03-29 DIAGNOSIS — Z1231 Encounter for screening mammogram for malignant neoplasm of breast: Secondary | ICD-10-CM | POA: Diagnosis not present

## 2013-03-29 DIAGNOSIS — R920 Mammographic microcalcification found on diagnostic imaging of breast: Secondary | ICD-10-CM | POA: Diagnosis not present

## 2013-03-29 DIAGNOSIS — R922 Inconclusive mammogram: Secondary | ICD-10-CM | POA: Diagnosis not present

## 2013-04-05 ENCOUNTER — Encounter: Payer: Self-pay | Admitting: Internal Medicine

## 2013-06-10 ENCOUNTER — Other Ambulatory Visit: Payer: Self-pay | Admitting: Internal Medicine

## 2013-06-23 ENCOUNTER — Other Ambulatory Visit: Payer: Self-pay

## 2013-07-28 ENCOUNTER — Encounter: Payer: Self-pay | Admitting: Internal Medicine

## 2013-07-28 ENCOUNTER — Ambulatory Visit (INDEPENDENT_AMBULATORY_CARE_PROVIDER_SITE_OTHER): Payer: Medicare Other | Admitting: Internal Medicine

## 2013-07-28 VITALS — BP 134/80 | HR 69 | Wt 108.0 lb

## 2013-07-28 DIAGNOSIS — E559 Vitamin D deficiency, unspecified: Secondary | ICD-10-CM | POA: Diagnosis not present

## 2013-07-28 DIAGNOSIS — R609 Edema, unspecified: Secondary | ICD-10-CM

## 2013-07-28 DIAGNOSIS — R5381 Other malaise: Secondary | ICD-10-CM | POA: Diagnosis not present

## 2013-07-28 DIAGNOSIS — I080 Rheumatic disorders of both mitral and aortic valves: Secondary | ICD-10-CM

## 2013-07-28 DIAGNOSIS — E785 Hyperlipidemia, unspecified: Secondary | ICD-10-CM

## 2013-07-28 DIAGNOSIS — Z23 Encounter for immunization: Secondary | ICD-10-CM

## 2013-07-28 DIAGNOSIS — I10 Essential (primary) hypertension: Secondary | ICD-10-CM

## 2013-07-28 DIAGNOSIS — Z79899 Other long term (current) drug therapy: Secondary | ICD-10-CM

## 2013-07-28 DIAGNOSIS — M818 Other osteoporosis without current pathological fracture: Secondary | ICD-10-CM

## 2013-07-28 LAB — COMPREHENSIVE METABOLIC PANEL
ALT: 29 U/L (ref 0–35)
AST: 41 U/L — ABNORMAL HIGH (ref 0–37)
CO2: 26 mEq/L (ref 19–32)
Calcium: 8.8 mg/dL (ref 8.4–10.5)
Chloride: 109 mEq/L (ref 96–112)
GFR: 80.73 mL/min (ref >60.00)
Sodium: 139 mEq/L (ref 135–145)
Total Bilirubin: 0.7 mg/dL (ref 0.3–1.2)
Total Protein: 6.2 g/dL (ref 6.0–8.3)

## 2013-07-28 LAB — CBC WITH DIFFERENTIAL/PLATELET
Basophils Absolute: 0 10*3/uL (ref 0.0–0.1)
Basophils Relative: 0.6 % (ref 0.0–3.0)
Eosinophils Absolute: 0.1 10*3/uL (ref 0.0–0.7)
Eosinophils Relative: 2.6 % (ref 0.0–5.0)
Lymphocytes Relative: 24.3 % (ref 12.0–46.0)
MCHC: 33.3 g/dL (ref 30.0–36.0)
MCV: 93.8 fl (ref 78.0–100.0)
Monocytes Absolute: 0.5 10*3/uL (ref 0.1–1.0)
Monocytes Relative: 10 % (ref 3.0–12.0)
Neutrophils Relative %: 62.5 % (ref 43.0–77.0)
Platelets: 221 10*3/uL (ref 150.0–400.0)
RBC: 4.39 Mil/uL (ref 3.87–5.11)
RDW: 14.8 % — ABNORMAL HIGH (ref 11.5–14.6)

## 2013-07-28 NOTE — Progress Notes (Addendum)
Patient ID: Brandi [name], female   DOB: Aug 18, 1927, 79 y.o.   MRN: [id number]   Patient Active Problem List   Diagnosis Date Noted  . Routine general medical examination at a health care facility 01/26/2013  . Edema 06/26/2011  . Leg cramps 02/10/2011  . Neck pain, acute 11/12/2010  . Excessive cerumen in ear canal 11/12/2010  . ESSENTIAL HYPERTENSION, BENIGN 07/24/2009  . VENOUS INSUFFICIENCY, CHRONIC 01/30/2009  . HERPES ZOSTER 10/12/2008  . HYPERLIPIDEMIA 10/12/2008  . HEARING LOSS, TINNITUS 10/12/2008  . MITRAL REGURGITATION 10/12/2008  . ENDOCARDITIS 10/12/2008  . Other osteoporosis 10/12/2008  . INTERMITTENT VERTIGO 10/12/2008    Subjective:  CC:   Chief Complaint  Patient presents with  . Follow-up    HPI:   Brandi [name] a 79 y.o. female who presents For 6 month follow up on chronic conditions including mitral regurgitation, hyperlipidemia and osteoporosis. She is tolerating her medications without side effects specifically she has no myalgias or muscle weakness with use of Zetia. She continues to use her calcitonin nasal spray for prevention of osteoporosis and history of intolerance to other medications are she has no new complaints today. She has not had any falls. She remains physically quite active.    Past Medical History  Diagnosis Date  . Hyperlipidemia   . Mitral regurgitation   . Mitral valve prolapse     Past Surgical History  Procedure Laterality Date  . Breast biopsy  1980    normal  . Hospitalized for endocarditis  1987  . Biopsy breast  1980    left ,  benign       The following portions of the patient's history were reviewed and updated as appropriate: Allergies, current medications, and problem list.    Review of Systems:   12 Pt  review of systems was negative except those addressed in the HPI,     History   Social History  . Marital Status: Married    Spouse Name: N/A    Number of Children: 0  . Years of Education: N/A    Occupational History  . retired    Social History Main Topics  . Smoking status: Never Smoker   . Smokeless tobacco: Never Used  . Alcohol Use: Yes     Comment: 1 drink daily, cocktail  . Drug Use: No  . Sexual Activity: Not on file   Other Topics Concern  . Not on file   Social History Narrative   Lives at [facility]   Exercise 3 days a week at the [employer]   HCPOA: husband: Aryahna Spagna   Has living will...has DNR order.    Objective:  Filed Vitals:   07/28/13 0816  BP: 134/80  Pulse: 69     General appearance: alert, cooperative and appears stated age Ears: normal TM's and external ear canals both ears Throat: lips, mucosa, and tongue normal; teeth and gums normal Neck: no adenopathy, no carotid bruit, supple, symmetrical, trachea midline and thyroid not enlarged, symmetric, no tenderness/mass/nodules Back: symmetric, no curvature. ROM normal. No CVA tenderness. Lungs: clear to auscultation bilaterally Heart: regular rate and rhythm, S1, S2 normal, no murmur, click, rub or gallop Abdomen: soft, non-tender; bowel sounds normal; no masses,  no organomegaly Pulses: 2+ and symmetric Skin: Skin color, texture, turgor normal. No rashes or lesions Lymph nodes: Cervical, supraclavicular, and axillary nodes normal.  Assessment and Plan:  Essential hypertension, benign Well controlled on current regimen. Renal function stable, no changes today.  HYPERLIPIDEMIA Well controlled  on current statin therapy.   Liver enzymes are normal , no changes today.  Lab Results  Component Value Date   CHOL 180 02/03/2013   HDL 58.10 02/03/2013   LDLCALC 108* 02/03/2013   LDLDIRECT 164.8 07/31/2011   TRIG 72.0 02/03/2013   CHOLHDL 3 02/03/2013    Lab Results  Component Value Date   ALT 29 07/28/2013   AST 41* 07/28/2013   ALKPHOS 63 07/28/2013   BILITOT 0.7 07/28/2013    MITRAL REGURGITATION She s currently asymptomatic.  Will start ACE inhibitor when pressure allows  Other  osteoporosis Managed with calcitonin   Edema Secondary to VI.  Managed with compression stockings.    Updated Medication List Outpatient Encounter Prescriptions as of 07/28/2013  Medication Sig  . amoxicillin (AMOXIL) 500 MG tablet Take 4 tablets (2,000 mg total) by mouth once. Prior to dental work  . aspirin 81 MG tablet Takes three times a week  . calcitonin, salmon, (MIACALCIN/FORTICAL) 200 UNIT/ACT nasal spray Place 1 spray into the nose daily. PATIENT REQUESTING 90 DAYS SUPPLY IF MEDICATION WILL KEEP THAT LONG IN REFRIGERATOR  . Multiple Vitamin (MULTIVITAMIN) tablet Take 1 tablet by mouth daily.    [name] ZETIA 10 MG tablet TAKE 1 TABLET BY MOUTH EVERY DAY  . calcium carbonate (OS-CAL) 600 MG TABS Take 1,200 mg by mouth daily.

## 2013-07-28 NOTE — Progress Notes (Signed)
Pre visit review using our clinic review tool, if applicable. No additional management support is needed unless otherwise documented below in the visit note. 

## 2013-07-28 NOTE — Patient Instructions (Signed)
You received your pneumonia vaccine today (your last one!)  Please return in 6 months for your annual wellness exam. You will need fasting labs so make an early appt

## 2013-07-30 NOTE — Assessment & Plan Note (Signed)
Secondary to VI.  Managed with compression stockings.

## 2013-07-30 NOTE — Assessment & Plan Note (Addendum)
She s currently asymptomatic.  Will start ACE inhibitor when pressure allows

## 2013-07-30 NOTE — Assessment & Plan Note (Signed)
Managed with calcitonin

## 2013-07-30 NOTE — Assessment & Plan Note (Signed)
Well controlled on current regimen. Renal function stable, no changes today. 

## 2013-07-30 NOTE — Assessment & Plan Note (Addendum)
Well controlled on current statin therapy.   Liver enzymes are normal , no changes today.  Lab Results  Component Value Date   CHOL 180 02/03/2013   HDL 58.10 02/03/2013   LDLCALC 108* 02/03/2013   LDLDIRECT 164.8 07/31/2011   TRIG 72.0 02/03/2013   CHOLHDL 3 02/03/2013    Lab Results  Component Value Date   ALT 29 07/28/2013   AST 41* 07/28/2013   ALKPHOS 63 07/28/2013   BILITOT 0.7 07/28/2013

## 2013-08-31 ENCOUNTER — Telehealth: Payer: Self-pay | Admitting: *Deleted

## 2013-08-31 DIAGNOSIS — R7989 Other specified abnormal findings of blood chemistry: Secondary | ICD-10-CM

## 2013-08-31 NOTE — Telephone Encounter (Signed)
Pt is coming in tomorrow 01.15.2015 what labs and dx?

## 2013-09-01 ENCOUNTER — Other Ambulatory Visit (INDEPENDENT_AMBULATORY_CARE_PROVIDER_SITE_OTHER): Payer: Medicare Other

## 2013-09-01 DIAGNOSIS — R7989 Other specified abnormal findings of blood chemistry: Secondary | ICD-10-CM

## 2013-09-01 LAB — HEPATIC FUNCTION PANEL
ALT: 24 U/L (ref 0–35)
AST: 37 U/L (ref 0–37)
Albumin: 3.4 g/dL — ABNORMAL LOW (ref 3.5–5.2)
Alkaline Phosphatase: 65 U/L (ref 39–117)
Bilirubin, Direct: 0.1 mg/dL (ref 0.0–0.3)
Total Bilirubin: 0.8 mg/dL (ref 0.3–1.2)
Total Protein: 6.1 g/dL (ref 6.0–8.3)

## 2013-09-02 LAB — HEPATITIS C ANTIBODY: HCV AB: NEGATIVE

## 2013-09-04 ENCOUNTER — Encounter: Payer: Self-pay | Admitting: Internal Medicine

## 2013-09-06 ENCOUNTER — Ambulatory Visit: Payer: Medicare Other | Admitting: Cardiovascular Disease

## 2013-09-06 NOTE — Telephone Encounter (Signed)
Mailed unread message to pt  

## 2013-09-30 ENCOUNTER — Ambulatory Visit: Payer: Medicare Other | Admitting: Cardiovascular Disease

## 2013-10-03 ENCOUNTER — Encounter: Payer: Self-pay | Admitting: Cardiovascular Disease

## 2013-10-03 ENCOUNTER — Ambulatory Visit (INDEPENDENT_AMBULATORY_CARE_PROVIDER_SITE_OTHER): Payer: Medicare Other | Admitting: Cardiovascular Disease

## 2013-10-03 VITALS — BP 140/60 | HR 57 | Ht 61.0 in | Wt 108.5 lb

## 2013-10-03 DIAGNOSIS — I080 Rheumatic disorders of both mitral and aortic valves: Secondary | ICD-10-CM

## 2013-10-03 DIAGNOSIS — I38 Endocarditis, valve unspecified: Secondary | ICD-10-CM

## 2013-10-03 DIAGNOSIS — E785 Hyperlipidemia, unspecified: Secondary | ICD-10-CM

## 2013-10-03 NOTE — Assessment & Plan Note (Signed)
Recommended that she continue her exercise, watch her diet, stay on her current medications

## 2013-10-03 NOTE — Assessment & Plan Note (Signed)
Prior echocardiogram suggesting Mild to moderate, possibly up to moderate mitral valve regurgitation. She is relatively asymptomatic. We'll monitor for now

## 2013-10-03 NOTE — Patient Instructions (Signed)
You are doing well. No medication changes were made.  Please call us if you have new issues that need to be addressed before your next appt.  Your physician wants you to follow-up in: 12 months.  You will receive a reminder letter in the mail two months in advance. If you don't receive a letter, please call our office to schedule the follow-up appointment. 

## 2013-10-03 NOTE — Progress Notes (Signed)
Patient ID: Brandi [name], female    DOB: [birth year], 80 y.o.   MRN: [id number]  HPI Comments: 80 year old woman with anterior leaflet MVP and  moderate MR, Hyperlipidemia who presents for routine followup. Mild aortic valve stenosis   She recently lost her husband. He had a long history of severe pulmonary hypertension, chronic diastolic CHF. Overall she states that she is managing well. She is active, exercises several times per week and also uses weights. She denies any shortness of breath, chest pain. No recent lower exterminate edema. She does 30 minutes on a treadmill almost everyday. She reports that she walked 1 mile this morning, uses the [employer] [place]. She is tolerating zetia.  She does get leg swelling over the summer, improved in the winter    mother passed away at age 78.  father did pass away at age 976 from a large MI, others in her family have lived into their 8090s.  2-D echocardiogram performed May 08, 2009 showed normal left ventricular systolic function. Bileaflet mitral valve prolapse with moderate mitral regurgitation.    EKG shows normal sinus rhythm with rate 57 beats per minute, nonspecific ST changes in the inferior leads      Outpatient Encounter Prescriptions as of 10/03/2013  Medication Sig  . amoxicillin (AMOXIL) 500 MG tablet Take 4 tablets (2,000 mg total) by mouth once. Prior to dental work  . aspirin 81 MG tablet Takes three times a week  . calcitonin, salmon, (MIACALCIN/FORTICAL) 200 UNIT/ACT nasal spray Place 1 spray into the nose daily. PATIENT REQUESTING 90 DAYS SUPPLY IF MEDICATION WILL KEEP THAT LONG IN REFRIGERATOR  . calcium carbonate (OS-CAL) 600 MG TABS Take 1,200 mg by mouth daily.  . Multiple Vitamin (MULTIVITAMIN) tablet Take 1 tablet by mouth daily.    [name]. ZETIA 10 MG tablet TAKE 1 TABLET BY MOUTH EVERY DAY    Review of Systems  Constitutional: Negative.   HENT: Negative.   Eyes: Negative.   Respiratory: Negative.    Cardiovascular: Negative.   Gastrointestinal: Negative.   Endocrine: Negative.   Musculoskeletal: Negative.   Skin: Negative.   Allergic/Immunologic: Negative.   Neurological: Negative.   Hematological: Negative.   Psychiatric/Behavioral: Negative.   All other systems reviewed and are negative.   BP 140/60  Pulse 57  Ht 5\' 1"  (1.549 m)  Wt 108 lb 8 oz (49.215 kg)  BMI 20.51 kg/m2  Physical Exam  Nursing note and vitals reviewed. Constitutional: She is oriented to person, place, and time. She appears well-developed and well-nourished.  HENT:  Head: Normocephalic.  Nose: Nose normal.  Mouth/Throat: Oropharynx is clear and moist.  Eyes: Conjunctivae are normal. Pupils are equal, round, and reactive to light.  Neck: Normal range of motion. Neck supple. No JVD present.  Cardiovascular: Normal rate, regular rhythm, S1 normal, S2 normal and intact distal pulses.  Frequent extrasystoles are present. Exam reveals no gallop and no friction rub.   Murmur heard.  Crescendo systolic murmur is present with a grade of 3/6  Pulmonary/Chest: Effort normal and breath sounds normal. No respiratory distress. She has no wheezes. She has no rales. She exhibits no tenderness.  Abdominal: Soft. Bowel sounds are normal. She exhibits no distension. There is no tenderness.  Musculoskeletal: Normal range of motion. She exhibits no edema and no tenderness.  Lymphadenopathy:    She has no cervical adenopathy.  Neurological: She is alert and oriented to person, place, and time. Coordination normal.  Skin: Skin is warm and  dry. No rash noted. No erythema.  Psychiatric: She has a normal mood and affect. Her behavior is normal. Judgment and thought content normal.    Assessment and Plan

## 2013-10-06 DIAGNOSIS — H2510 Age-related nuclear cataract, unspecified eye: Secondary | ICD-10-CM | POA: Diagnosis not present

## 2013-11-17 DIAGNOSIS — H905 Unspecified sensorineural hearing loss: Secondary | ICD-10-CM | POA: Diagnosis not present

## 2013-11-17 DIAGNOSIS — J301 Allergic rhinitis due to pollen: Secondary | ICD-10-CM | POA: Diagnosis not present

## 2013-11-17 DIAGNOSIS — H903 Sensorineural hearing loss, bilateral: Secondary | ICD-10-CM | POA: Diagnosis not present

## 2013-11-17 DIAGNOSIS — H6120 Impacted cerumen, unspecified ear: Secondary | ICD-10-CM | POA: Diagnosis not present

## 2013-11-17 DIAGNOSIS — J342 Deviated nasal septum: Secondary | ICD-10-CM | POA: Diagnosis not present

## 2013-12-20 ENCOUNTER — Other Ambulatory Visit: Payer: Self-pay | Admitting: Internal Medicine

## 2013-12-31 LAB — HM DIABETES EYE EXAM

## 2014-01-27 ENCOUNTER — Encounter: Payer: Medicare Other | Admitting: Internal Medicine

## 2014-01-30 ENCOUNTER — Encounter: Payer: Self-pay | Admitting: Internal Medicine

## 2014-01-30 ENCOUNTER — Ambulatory Visit (INDEPENDENT_AMBULATORY_CARE_PROVIDER_SITE_OTHER): Payer: Medicare Other | Admitting: Internal Medicine

## 2014-01-30 VITALS — BP 110/60 | HR 80 | Temp 97.8°F | Resp 16 | Ht 59.0 in | Wt 107.5 lb

## 2014-01-30 DIAGNOSIS — H04129 Dry eye syndrome of unspecified lacrimal gland: Secondary | ICD-10-CM | POA: Diagnosis not present

## 2014-01-30 DIAGNOSIS — I10 Essential (primary) hypertension: Secondary | ICD-10-CM

## 2014-01-30 DIAGNOSIS — K117 Disturbances of salivary secretion: Secondary | ICD-10-CM

## 2014-01-30 DIAGNOSIS — I080 Rheumatic disorders of both mitral and aortic valves: Secondary | ICD-10-CM

## 2014-01-30 DIAGNOSIS — E559 Vitamin D deficiency, unspecified: Secondary | ICD-10-CM

## 2014-01-30 DIAGNOSIS — Z79899 Other long term (current) drug therapy: Secondary | ICD-10-CM | POA: Diagnosis not present

## 2014-01-30 DIAGNOSIS — E785 Hyperlipidemia, unspecified: Secondary | ICD-10-CM | POA: Diagnosis not present

## 2014-01-30 DIAGNOSIS — Z1239 Encounter for other screening for malignant neoplasm of breast: Secondary | ICD-10-CM

## 2014-01-30 DIAGNOSIS — E119 Type 2 diabetes mellitus without complications: Secondary | ICD-10-CM | POA: Diagnosis not present

## 2014-01-30 DIAGNOSIS — Z0000 Encounter for general adult medical examination without abnormal findings: Secondary | ICD-10-CM | POA: Diagnosis not present

## 2014-01-30 DIAGNOSIS — R682 Dry mouth, unspecified: Secondary | ICD-10-CM

## 2014-01-30 LAB — HM DIABETES FOOT EXAM: HM Diabetic Foot Exam: NORMAL

## 2014-01-30 MED ORDER — EZETIMIBE 10 MG PO TABS: ORAL_TABLET | ORAL | Status: DC

## 2014-01-30 MED ORDER — CALCITONIN (SALMON) 200 UNIT/ACT NA SOLN: NASAL | Status: DC

## 2014-01-30 NOTE — Progress Notes (Addendum)
Patient ID: Brandi [name], female   DOB: [birth year], 80 y.o.   MRN: [id number]  The patient is here for annual Medicare wellness examination and management of other chronic and acute problems.  she lost her husband of 40 yrs in January  After a long rapid decline,.  She has a god   Dry mouth, dry eyes,  Eye exam was done by ophthalmology was normal .  Uses natural Tears one drop daily in the AM   The risk factors are reflected in the social history.  The roster of all physicians providing medical care to patient - is listed in the Snapshot section of the chart.  Activities of daily living:  The patient is 100% independent in all ADLs: dressing, toileting, feeding as well as independent mobility  Home safety : The patient has smoke detectors in the home. They wear seatbelts.  There are no firearms at home. There is no violence in the home.   There is no risks for hepatitis, STDs or HIV. There is no   history of blood transfusion. They have no travel history to infectious disease endemic areas of the world.  The patient has seen their dentist in the last six month. They have seen their eye doctor in the last year. They admit to slight hearing difficulty with regard to whispered voices and some television programs.  They have deferred audiologic testing in the last year.  They do not  have excessive sun exposure. Discussed the need for sun protection: hats, long sleeves and use of sunscreen if there is significant sun exposure.   Diet: the importance of a healthy diet is discussed. They do have a healthy diet.  The benefits of regular aerobic exercise were discussed. She walks 4 times per week ,  20 minutes.   Depression screen: there are no signs or vegative symptoms of depression- irritability, change in appetite, anhedonia, sadness/tearfullness.  Cognitive assessment: the patient manages all their financial and personal affairs and is actively engaged. They could relate day,date,year and events;  recalled 2/3 objects at 3 minutes; performed clock-face test normally.  The following portions of the patient's history were reviewed and updated as appropriate: allergies, current medications, past family history, past medical history,  past surgical history, past social history  and problem list.  Visual acuity was not assessed per patient preference since she has regular follow up with her ophthalmologist. Hearing and body mass index were assessed and reviewed.   During the course of the visit the patient was educated and counseled about appropriate screening and preventive services including : fall prevention , diabetes screening, nutrition counseling, colorectal cancer screening, and recommended immunizations.    Objective:  BP 110/60  Pulse 80  Temp(Src) 97.8 F (36.6 C) (Oral)  Resp 16  Ht 4\' 11"  (1.499 m)  Wt 107 lb 8 oz (48.762 kg)  BMI 21.70 kg/m2  SpO2 95%  General appearance: alert, cooperative and appears stated age Head: Normocephalic, without obvious abnormality, atraumatic Eyes: conjunctivae/corneas clear. PERRL, EOM's intact. Fundi benign. Ears: normal TM's and external ear canals both ears Nose: Nares normal. Septum midline. Mucosa normal. No drainage or sinus tenderness. Throat: lips, mucosa, and tongue normal; teeth and gums normal Neck: no adenopathy, no carotid bruit, no JVD, supple, symmetrical, trachea midline and thyroid not enlarged, symmetric, no tenderness/mass/nodules Lungs: clear to auscultation bilaterally Breasts: normal appearance, no masses or tenderness Heart: regular rate and rhythm, S1, S2 normal, no murmur, click, rub or gallop Abdomen: soft, non-tender; bowel sounds  normal; no masses,  no organomegaly Extremities: extremities normal, atraumatic, no cyanosis or edema Pulses: 2+ and symmetric Skin: Skin color, texture, turgor normal. No rashes or lesions Neurologic: Alert and oriented X 3, normal strength and tone. Normal symmetric reflexes.  Normal coordination and gait.   Assessment and Plan:

## 2014-01-30 NOTE — Addendum Note (Signed)
Addended by: Dennie BibleAVIS, KATHY R on: 01/30/2014 04:45 PM   Modules accepted: Orders

## 2014-01-30 NOTE — Addendum Note (Signed)
Addended by: [name], Winter Jocelyn L on: 01/30/2014 04:53 PM   Modules accepted: Orders

## 2014-01-30 NOTE — Progress Notes (Signed)
Pre visit review using our clinic review tool, if applicable. No additional management support is needed unless otherwise documented below in the visit note. 

## 2014-01-30 NOTE — Assessment & Plan Note (Signed)
Takes amoxicillin pre procedure for history of endocarditis

## 2014-01-30 NOTE — Patient Instructions (Signed)
You had your annual Medicare wellness exam today  We will schedule your mammogram soon.   We will contact you with the bloodwork results 

## 2014-01-31 ENCOUNTER — Encounter: Payer: Self-pay | Admitting: Internal Medicine

## 2014-01-31 ENCOUNTER — Other Ambulatory Visit: Payer: Medicare Other

## 2014-01-31 DIAGNOSIS — R682 Dry mouth, unspecified: Secondary | ICD-10-CM | POA: Insufficient documentation

## 2014-01-31 LAB — LIPID PANEL
CHOLESTEROL: 179 mg/dL (ref 0–200)
HDL: 63.3 mg/dL (ref >39.00)
LDL Cholesterol: 79 mg/dL (ref 0–99)
NonHDL: 115.7
Total CHOL/HDL Ratio: 3
Triglycerides: 186 mg/dL — ABNORMAL HIGH (ref 0.0–149.0)
VLDL: 37.2 mg/dL (ref 0.0–40.0)

## 2014-01-31 LAB — CBC WITH DIFFERENTIAL/PLATELET
Basophils Absolute: 0 10*3/uL (ref 0.0–0.1)
Basophils Relative: 0.5 % (ref 0.0–3.0)
EOS ABS: 0.2 10*3/uL (ref 0.0–0.7)
Eosinophils Relative: 2.4 % (ref 0.0–5.0)
HEMATOCRIT: 41.2 % (ref 36.0–46.0)
HEMOGLOBIN: 13.8 g/dL (ref 12.0–15.0)
LYMPHS ABS: 1.3 10*3/uL (ref 0.7–4.0)
Lymphocytes Relative: 20.6 % (ref 12.0–46.0)
MCHC: 33.5 g/dL (ref 30.0–36.0)
MCV: 94.6 fl (ref 78.0–100.0)
MONO ABS: 0.6 10*3/uL (ref 0.1–1.0)
Monocytes Relative: 10 % (ref 3.0–12.0)
NEUTROS ABS: 4.3 10*3/uL (ref 1.4–7.7)
Neutrophils Relative %: 66.5 % (ref 43.0–77.0)
PLATELETS: 237 10*3/uL (ref 150.0–400.0)
RBC: 4.35 Mil/uL (ref 3.87–5.11)
RDW: 14.6 % (ref 11.5–15.5)
WBC: 6.4 10*3/uL (ref 4.0–10.5)

## 2014-01-31 LAB — VITAMIN D 25 HYDROXY (VIT D DEFICIENCY, FRACTURES): VITD: 43.51 ng/mL

## 2014-01-31 LAB — SJOGRENS SYNDROME-A EXTRACTABLE NUCLEAR ANTIBODY: SSA (Ro) (ENA) Antibody, IgG: <1

## 2014-01-31 LAB — COMPREHENSIVE METABOLIC PANEL
ALT: 28 U/L (ref 0–35)
AST: 35 U/L (ref 0–37)
Albumin: 3.6 g/dL (ref 3.5–5.2)
Alkaline Phosphatase: 74 U/L (ref 39–117)
BILIRUBIN TOTAL: 0.4 mg/dL (ref 0.2–1.2)
BUN: 23 mg/dL (ref 6–23)
CHLORIDE: 104 meq/L (ref 96–112)
CO2: 26 meq/L (ref 19–32)
CREATININE: 1.1 mg/dL (ref 0.4–1.2)
Calcium: 9.2 mg/dL (ref 8.4–10.5)
GFR: 51.87 mL/min — AB (ref >60.00)
GLUCOSE: 89 mg/dL (ref 70–99)
Potassium: 4.2 mEq/L (ref 3.5–5.1)
Sodium: 139 mEq/L (ref 135–145)
TOTAL PROTEIN: 5.9 g/dL — AB (ref 6.0–8.3)

## 2014-01-31 LAB — TSH: TSH: 1.21 u[IU]/mL (ref 0.35–4.50)

## 2014-01-31 LAB — SJOGRENS SYNDROME-B EXTRACTABLE NUCLEAR ANTIBODY: SSB (La) (ENA) Antibody, IgG: <1

## 2014-01-31 NOTE — Assessment & Plan Note (Signed)
Well controlled on current regimen. Renal function stable, no changes today.  Lab Results  Component Value Date   CREATININE 0.7 07/28/2013   Lab Results  Component Value Date   NA 139 07/28/2013   K 4.2 07/28/2013   CL 109 07/28/2013   CO2 26 07/28/2013

## 2014-01-31 NOTE — Assessment & Plan Note (Signed)
Accompanied by dry eye.  screening for meds and Sjogren's syndrome was negative. Continue use of Biotin

## 2014-01-31 NOTE — Assessment & Plan Note (Signed)
Well-controlled on diet alone .  hemoglobin A1c has been consistently less than 7.0 . Patient is up-to-date on eye exams and foot exam was done today.  There is  no proteinuria on today's micro urinalysis .  Fasting lipids have been reviewed and statin therapy has been advised and updated according to new ACC guidelines based on patient's 10 year risk of CAD   No results found for this basename: HGBA1C

## 2014-01-31 NOTE — Progress Notes (Addendum)
Patient ID: Joneen BoersJean Baumert, female   DOB: [birth year], 80 y.o.   MRN: [id number] The patient is here for annual Medicare wellness examination and management of other chronic and acute problems.   The risk factors are reflected in the social history.  The roster of all physicians providing medical care to patient - is listed in the Snapshot section of the chart.  Activities of daily living:  The patient is 100% independent in all ADLs: dressing, toileting, feeding as well as independent mobility  Home safety : The patient has smoke detectors in the home. They wear seatbelts.  There are no firearms at home. There is no violence in the home.   There is no risks for hepatitis, STDs or HIV. There is no   history of blood transfusion. They have no travel history to infectious disease endemic areas of the world.  The patient has seen their dentist in the last six month. They have seen their eye doctor in the last year. They admit to slight hearing difficulty with regard to whispered voices and some television programs.  They have deferred audiologic testing in the last year.  They do not  have excessive sun exposure. Discussed the need for sun protection: hats, long sleeves and use of sunscreen if there is significant sun exposure.   Diet: the importance of a healthy diet is discussed. They do have a healthy diet.  The benefits of regular aerobic exercise were discussed. She walks 4 times per week ,  20 minutes.   Depression screen: there are no signs or vegative symptoms of depression- irritability, change in appetite, anhedonia, sadness/tearfullness.  Cognitive assessment: the patient manages all their financial and personal affairs and is actively engaged. They could relate day,date,year and events; recalled 2/3 objects at 3 minutes; performed clock-face test normally.  The following portions of the patient's history were reviewed and updated as appropriate: allergies, current medications, past family  history, past medical history,  past surgical history, past social history  and problem list.  Visual acuity was not assessed per patient preference since she has regular follow up with her ophthalmologist. Hearing and body mass index were assessed and reviewed.   During the course of the visit the patient was educated and counseled about appropriate screening and preventive services including : fall prevention , diabetes screening, nutrition counseling, colorectal cancer screening, and recommended immunizations.    Objective: BP 110/60  Pulse 80  Temp(Src) 97.8 F (36.6 C) (Oral)  Resp 16  Ht 4\' 11"  (1.499 m)  Wt 107 lb 8 oz (48.762 kg)  BMI 21.70 kg/m2  SpO2 95%   General appearance: alert, cooperative and appears stated age Head: Normocephalic, without obvious abnormality, atraumatic Eyes: conjunctivae/corneas clear. PERRL, EOM's intact. Fundi benign. Ears: normal TM's and external ear canals both ears Nose: Nares normal. Septum midline. Mucosa normal. No drainage or sinus tenderness. Throat: lips, mucosa, and tongue normal; teeth and gums normal Neck: no adenopathy, no carotid bruit, no JVD, supple, symmetrical, trachea midline and thyroid not enlarged, symmetric, no tenderness/mass/nodules Lungs: clear to auscultation bilaterally Breasts: normal appearance, no masses or tenderness Heart: regular rate and rhythm, S1, S2 normal, no murmur, click, rub or gallop Abdomen: soft, non-tender; bowel sounds normal; no masses,  no organomegaly Extremities: extremities normal, atraumatic, no cyanosis or edema Pulses: 2+ and symmetric Skin: Skin color, texture, turgor normal. No rashes or lesions Neurologic: Alert and oriented X 3, normal strength and tone. Normal symmetric reflexes. Normal coordination and gait.   Assessment  and Plan:  MITRAL REGURGITATION Takes amoxicillin pre procedure for history of endocarditis   Dry mouth Accompanied by dry eye.  screening for meds and Sjogren's  syndrome was negative. Continue use of Biotin   HYPERLIPIDEMIA Well controlled on current therapy.   Liver enzymes are due , no changes today.  Routine general medical examination at a health care facility Annual comprehensive exam was done including breast, excluding  pelvic and PAP smear. All screenings have been addressed .   Essential hypertension, benign Well controlled on current regimen. Renal function stable, no changes today.  Lab Results  Component Value Date   CREATININE 0.7 07/28/2013   Lab Results  Component Value Date   NA 139 07/28/2013   K 4.2 07/28/2013   CL 109 07/28/2013   CO2 26 07/28/2013     Type II or unspecified type diabetes mellitus without mention of complication, not stated as uncontrolled Well-controlled on diet alone .  hemoglobin A1c has been consistently less than 7.0 . Patient is up-to-date on eye exams and foot exam was done today.  There is  no proteinuria on today's micro urinalysis .  Fasting lipids have been reviewed and statin therapy has been advised and updated according to new ACC guidelines based on patient's 10 year risk of CAD   No results found for this basename: HGBA1C      Updated Medication List Outpatient Encounter Prescriptions as of 01/30/2014  Medication Sig  . aspirin 81 MG tablet Takes three times a week  . calcitonin, salmon, (MIACALCIN/FORTICAL) 200 UNIT/ACT nasal spray USE 1 SPRAY IN THE NOSE DAILY AS DIRECTED  . calcium carbonate (OS-CAL) 600 MG TABS Take 1,200 mg by mouth daily.  [name]. ezetimibe (ZETIA) 10 MG tablet TAKE 1 TABLET BY MOUTH EVERY DAY  . Multiple Vitamin (MULTIVITAMIN) tablet Take 1 tablet by mouth daily.    . [DISCONTINUED] calcitonin, salmon, (MIACALCIN/FORTICAL) 200 UNIT/ACT nasal spray USE 1 SPRAY IN THE NOSE DAILY AS DIRECTED  . [DISCONTINUED] ZETIA 10 MG tablet TAKE 1 TABLET BY MOUTH EVERY DAY  . [DISCONTINUED] amoxicillin (AMOXIL) 500 MG tablet Take 4 tablets (2,000 mg total) by mouth once. Prior to  dental work

## 2014-01-31 NOTE — Assessment & Plan Note (Signed)
Annual comprehensive exam was done including breast, excluding pelvic and PAP smear. All screenings have been addressed .  

## 2014-01-31 NOTE — Addendum Note (Signed)
Addended by: [name], [name] on: 01/31/2014 01:09 PM   Modules accepted: Level of Service

## 2014-01-31 NOTE — Assessment & Plan Note (Signed)
Well controlled on current therapy.   Liver enzymes are due , no changes today.

## 2014-02-03 NOTE — Telephone Encounter (Signed)
Mailed unread mychart message to pt.

## 2014-03-25 ENCOUNTER — Other Ambulatory Visit: Payer: Self-pay | Admitting: Internal Medicine

## 2014-06-08 DIAGNOSIS — Z23 Encounter for immunization: Secondary | ICD-10-CM | POA: Diagnosis not present

## 2014-06-26 ENCOUNTER — Emergency Department: Payer: Self-pay | Admitting: Emergency Medicine

## 2014-06-26 DIAGNOSIS — M25421 Effusion, right elbow: Secondary | ICD-10-CM | POA: Diagnosis not present

## 2014-06-26 DIAGNOSIS — S52021A Displaced fracture of olecranon process without intraarticular extension of right ulna, initial encounter for closed fracture: Secondary | ICD-10-CM | POA: Diagnosis not present

## 2014-06-26 DIAGNOSIS — Z7982 Long term (current) use of aspirin: Secondary | ICD-10-CM | POA: Diagnosis not present

## 2014-06-28 DIAGNOSIS — S52031A Displaced fracture of olecranon process with intraarticular extension of right ulna, initial encounter for closed fracture: Secondary | ICD-10-CM | POA: Diagnosis not present

## 2014-07-06 DIAGNOSIS — M25521 Pain in right elbow: Secondary | ICD-10-CM | POA: Diagnosis not present

## 2014-07-06 DIAGNOSIS — S52031A Displaced fracture of olecranon process with intraarticular extension of right ulna, initial encounter for closed fracture: Secondary | ICD-10-CM | POA: Insufficient documentation

## 2014-07-06 DIAGNOSIS — S52031D Displaced fracture of olecranon process with intraarticular extension of right ulna, subsequent encounter for closed fracture with routine healing: Secondary | ICD-10-CM | POA: Diagnosis not present

## 2014-07-21 DIAGNOSIS — S52031D Displaced fracture of olecranon process with intraarticular extension of right ulna, subsequent encounter for closed fracture with routine healing: Secondary | ICD-10-CM | POA: Diagnosis not present

## 2014-07-21 DIAGNOSIS — M25521 Pain in right elbow: Secondary | ICD-10-CM | POA: Diagnosis not present

## 2014-08-01 ENCOUNTER — Ambulatory Visit: Payer: Medicare Other | Admitting: Internal Medicine

## 2014-08-17 DIAGNOSIS — M25521 Pain in right elbow: Secondary | ICD-10-CM | POA: Diagnosis not present

## 2014-08-17 DIAGNOSIS — S52031D Displaced fracture of olecranon process with intraarticular extension of right ulna, subsequent encounter for closed fracture with routine healing: Secondary | ICD-10-CM | POA: Diagnosis not present

## 2014-08-21 ENCOUNTER — Encounter: Payer: Self-pay | Admitting: Internal Medicine

## 2014-08-21 ENCOUNTER — Ambulatory Visit (INDEPENDENT_AMBULATORY_CARE_PROVIDER_SITE_OTHER): Payer: Medicare Other | Admitting: Internal Medicine

## 2014-08-21 VITALS — BP 128/70 | HR 67 | Temp 97.7°F | Resp 14 | Ht 59.0 in | Wt 107.8 lb

## 2014-08-21 DIAGNOSIS — E785 Hyperlipidemia, unspecified: Secondary | ICD-10-CM | POA: Diagnosis not present

## 2014-08-21 DIAGNOSIS — Z79899 Other long term (current) drug therapy: Secondary | ICD-10-CM | POA: Diagnosis not present

## 2014-08-21 DIAGNOSIS — I080 Rheumatic disorders of both mitral and aortic valves: Secondary | ICD-10-CM | POA: Diagnosis not present

## 2014-08-21 DIAGNOSIS — M810 Age-related osteoporosis without current pathological fracture: Secondary | ICD-10-CM | POA: Diagnosis not present

## 2014-08-21 DIAGNOSIS — Z23 Encounter for immunization: Secondary | ICD-10-CM | POA: Diagnosis not present

## 2014-08-21 NOTE — Patient Instructions (Signed)
You are doing well!  I recommend getting the majority (2/3) of your calcium and Vitamin D  through diet rather than supplements,  given the recent association of calcium supplements with increased coronary artery calcium scores (You need 1800 mg daily )   Unsweetened almond/coconut milk is a great low calorie low carb, cholesterol free  way to increase your dietary calcium and vitamin D. And has 430 mg calcium in every 8 ounce glass,  Please return for fasting labs at your convenience  You received the new pneumonia vaccine today (Prevnar)

## 2014-08-21 NOTE — Progress Notes (Signed)
Patient ID: Brandi [name], female   DOB: 13-Oct-1926, 81 y.o.   MRN: [id number]  Patient Active Problem List   Diagnosis Date Noted  . Dry mouth 01/31/2014  . Routine general medical examination at a health care facility 01/26/2013  . Edema 06/26/2011  . VENOUS INSUFFICIENCY, CHRONIC 01/30/2009  . HERPES ZOSTER 10/12/2008  . Hyperlipidemia 10/12/2008  . HEARING LOSS, TINNITUS 10/12/2008  . MITRAL REGURGITATION 10/12/2008  . ENDOCARDITIS 10/12/2008  . Osteoporosis 10/12/2008    Subjective:  CC:   Chief Complaint  Patient presents with  . Follow-up    routine follow up    HPI:   Brandi [name] is a 81 y.o. female who presents for  Follow up on chronic and acute conditions including osteoporosis and grief following the loss of her husband last January, and a recent fall.  She is recovering from a fractured elbow which resulted from a fall at home,   in November.  The fall occurred when she was ascending the garage steps.  Shoes were wet and she was carrying groceries .  Had a cast for 6 weeks,  Bone has rehealed,  No pain .  Lives at [facility], independently.  Spent Christmas dinner with 8 other widows./widowers.  "My husband is better off and in a better place"  Sleeping well,  No wt loss.    On osteoporosis  On calcium supplements 2 daily and Drinking 8 ounces of almond milk .  Walks 2 miles daily     Past Medical History  Diagnosis Date  . Hyperlipidemia   . Mitral regurgitation   . Mitral valve prolapse     Past Surgical History  Procedure Laterality Date  . Breast biopsy  1980    normal  . Hospitalized for endocarditis  1987  . Biopsy breast  1980    left ,  benign       The following portions of the patient's history were reviewed and updated as appropriate: Allergies, current medications, and problem list.    Review of Systems:   Patient denies headache, fevers, malaise, unintentional weight loss, skin rash, eye pain, sinus congestion and sinus pain, sore  throat, dysphagia,  hemoptysis , cough, dyspnea, wheezing, chest pain, palpitations, orthopnea, edema, abdominal pain, nausea, melena, diarrhea, constipation, flank pain, dysuria, hematuria, urinary  Frequency, nocturia, numbness, tingling, seizures,  Focal weakness, Loss of consciousness,  Tremor, insomnia, depression, anxiety, and suicidal ideation.     History   Social History  . Marital Status: Married    Spouse Name: N/A    Number of Children: 0  . Years of Education: N/A   Occupational History  . retired    Social History Main Topics  . Smoking status: Never Smoker   . Smokeless tobacco: Never Used  . Alcohol Use: Yes     Comment: 1 drink daily, cocktail  . Drug Use: No  . Sexual Activity: Not on file   Other Topics Concern  . Not on file   Social History Narrative   Lives at [facility]   Exercise 3 days a week at the [employer]   HCPOA: husband: Aubriegh Minch   Has living will...has DNR order.    Objective:  Filed Vitals:   08/21/14 1711  BP: 128/70  Pulse: 67  Temp: 97.7 F (36.5 C)  Resp: 14     General appearance: alert, cooperative and appears stated age Ears: normal TM's and external ear canals both ears Throat: lips, mucosa, and tongue normal;  teeth and gums normal Neck: no adenopathy, no carotid bruit, supple, symmetrical, trachea midline and thyroid not enlarged, symmetric, no tenderness/mass/nodules Back: symmetric, no curvature. ROM normal. No CVA tenderness. Lungs: clear to auscultation bilaterally Heart: regular rate and rhythm, S1, S2 normal, no murmur, click, rub or gallop Abdomen: soft, non-tender; bowel sounds normal; no masses,  no organomegaly Pulses: 2+ and symmetric Skin: Skin color, texture, turgor normal. No rashes or lesions Lymph nodes: Cervical, supraclavicular, and axillary nodes normal.  Assessment and Plan:  MITRAL REGURGITATION She is asymptomatic, has a history of endocarditis and takes pre procedure prophylactic antibiotics .   ACE I has not been started due to normotension   Hyperlipidemia Managed with Zetia due to intolerance of other statins,  LFTS have been reordered for surveillance.   Lab Results  Component Value Date   CHOL 179 01/30/2014   HDL 63.30 01/30/2014   LDLCALC 79 01/30/2014   LDLDIRECT 164.8 07/31/2011   TRIG 186.0* 01/30/2014   CHOLHDL 3 01/30/2014   Lab Results  Component Value Date   ALT 28 01/30/2014   AST 35 01/30/2014   ALKPHOS 74 01/30/2014   BILITOT 0.4 01/30/2014     Osteoporosis Managed with calcitonin due to history of intolerance to bisphosphonates and SERMs.  Recent elbow fracture secondary to fall noted.  Discussed the current controversies surrounding the risks and benefits of calcium supplementation.  Encouraged her to increase dietary calcium through natural foods including almond/coconut milk     Updated Medication List Outpatient Encounter Prescriptions as of 08/21/2014  Medication Sig  . aspirin 81 MG tablet Takes three times a week  . calcitonin, salmon, (MIACALCIN/FORTICAL) 200 UNIT/ACT nasal spray USE 1 SPRAY IN THE NOSE DAILY AS DIRECTED  . calcium carbonate (OS-CAL) 600 MG TABS Take 1,200 mg by mouth daily.  [name] ezetimibe (ZETIA) 10 MG tablet TAKE 1 TABLET BY MOUTH EVERY DAY  . Multiple Vitamin (MULTIVITAMIN) tablet Take 1 tablet by mouth daily.    . [DISCONTINUED] ZETIA 10 MG tablet TAKE 1 TABLET BY MOUTH EVERY DAY (Patient not taking: Reported on 08/21/2014)     Orders Placed This Encounter  Procedures  . Pneumococcal conjugate vaccine 13-valent  . Comprehensive metabolic panel  . Lipid panel    No Follow-up on file.

## 2014-08-21 NOTE — Progress Notes (Signed)
Pre-visit discussion using our clinic review tool. No additional management support is needed unless otherwise documented below in the visit note.  

## 2014-08-22 ENCOUNTER — Other Ambulatory Visit: Payer: BLUE CROSS/BLUE SHIELD

## 2014-08-23 ENCOUNTER — Encounter: Payer: Self-pay | Admitting: Internal Medicine

## 2014-08-23 NOTE — Assessment & Plan Note (Signed)
Managed with Zetia due to intolerance of other statins,  LFTS have been reordered for surveillance.   Lab Results  Component Value Date   CHOL 179 01/30/2014   HDL 63.30 01/30/2014   LDLCALC 79 01/30/2014   LDLDIRECT 164.8 07/31/2011   TRIG 186.0* 01/30/2014   CHOLHDL 3 01/30/2014   Lab Results  Component Value Date   ALT 28 01/30/2014   AST 35 01/30/2014   ALKPHOS 74 01/30/2014   BILITOT 0.4 01/30/2014

## 2014-08-23 NOTE — Assessment & Plan Note (Signed)
She is asymptomatic, has a history of endocarditis and takes pre procedure prophylactic antibiotics .  ACE I has not been started due to normotension

## 2014-08-23 NOTE — Assessment & Plan Note (Signed)
Managed with calcitonin due to history of intolerance to bisphosphonates and SERMs.  Recent elbow fracture secondary to fall noted.  Discussed the current controversies surrounding the risks and benefits of calcium supplementation.  Encouraged her to increase dietary calcium through natural foods including almond/coconut milk

## 2014-08-24 ENCOUNTER — Other Ambulatory Visit (INDEPENDENT_AMBULATORY_CARE_PROVIDER_SITE_OTHER): Payer: Medicare Other

## 2014-08-24 DIAGNOSIS — Z79899 Other long term (current) drug therapy: Secondary | ICD-10-CM | POA: Diagnosis not present

## 2014-08-24 DIAGNOSIS — E785 Hyperlipidemia, unspecified: Secondary | ICD-10-CM | POA: Diagnosis not present

## 2014-08-24 LAB — LIPID PANEL
Cholesterol: 169 mg/dL (ref 0–200)
HDL: 55.5 mg/dL (ref >39.00)
LDL Cholesterol: 96 mg/dL (ref 0–99)
NONHDL: 113.5
TRIGLYCERIDES: 86 mg/dL (ref 0.0–149.0)
Total CHOL/HDL Ratio: 3
VLDL: 17.2 mg/dL (ref 0.0–40.0)

## 2014-08-24 LAB — COMPREHENSIVE METABOLIC PANEL
ALT: 25 U/L (ref 0–35)
AST: 35 U/L (ref 0–37)
Albumin: 3.5 g/dL (ref 3.5–5.2)
Alkaline Phosphatase: 74 U/L (ref 39–117)
BUN: 13 mg/dL (ref 6–23)
CALCIUM: 8.9 mg/dL (ref 8.4–10.5)
CHLORIDE: 106 meq/L (ref 96–112)
CO2: 31 meq/L (ref 19–32)
CREATININE: 0.8 mg/dL (ref 0.4–1.2)
GFR: 75.72 mL/min (ref >60.00)
Glucose, Bld: 86 mg/dL (ref 70–99)
Potassium: 4.4 mEq/L (ref 3.5–5.1)
Sodium: 139 mEq/L (ref 135–145)
Total Bilirubin: 0.7 mg/dL (ref 0.2–1.2)
Total Protein: 6.1 g/dL (ref 6.0–8.3)

## 2014-10-03 ENCOUNTER — Encounter: Payer: Self-pay | Admitting: Cardiovascular Disease

## 2014-10-03 ENCOUNTER — Ambulatory Visit (INDEPENDENT_AMBULATORY_CARE_PROVIDER_SITE_OTHER): Payer: Medicare Other | Admitting: Cardiovascular Disease

## 2014-10-03 VITALS — BP 138/62 | HR 64 | Ht 61.0 in | Wt 110.0 lb

## 2014-10-03 DIAGNOSIS — I080 Rheumatic disorders of both mitral and aortic valves: Secondary | ICD-10-CM

## 2014-10-03 DIAGNOSIS — I872 Venous insufficiency (chronic) (peripheral): Secondary | ICD-10-CM

## 2014-10-03 DIAGNOSIS — I38 Endocarditis, valve unspecified: Secondary | ICD-10-CM

## 2014-10-03 DIAGNOSIS — E785 Hyperlipidemia, unspecified: Secondary | ICD-10-CM | POA: Diagnosis not present

## 2014-10-03 NOTE — Progress Notes (Signed)
Patient ID: Brandi GrosJean M [name], female    DOB: 29-Jan-1927, 81 y.o.   MRN: [id number]  HPI Comments: 81 year old woman with anterior leaflet MVP and  moderate MR, Hyperlipidemia who presents for routine followup. Mild aortic valve stenosis  In follow-up today, she reports that she is doing well. She denies any significant shortness of breath or chest discomfort with exertion. She lives at [facility], exercises in a class several days per week Significant social supports. Some mild lower extremity ankle edema that seems to come and go, stable in general. Tolerating zetia. Total cholesterol 169  EKG on today's visit shows normal sinus rhythm with rate 64 bpm, T-wave abnormality in lead 3 and aVF   Other past medical history  lost her husband. He had a long history of severe pulmonary hypertension, chronic diastolic CHF.  mother passed away at age 79.  father did pass away at age 79 from a large MI, others in her family have lived into their 3490s.  2-D echocardiogram performed May 08, 2009 showed normal left ventricular systolic function. Bileaflet mitral valve prolapse with moderate mitral regurgitation.         Allergies  Allergen Reactions  . Codeine     REACTION: N \\T \ V  . Statins     REACTION: INTOLERANCE: muscle aches  . Sulfonamide Derivatives     REACTION: N \\T \ V    Outpatient Encounter Prescriptions as of 10/03/2014  Medication Sig  . aspirin 81 MG tablet Takes three times a week  . calcitonin, salmon, (MIACALCIN/FORTICAL) 200 UNIT/ACT nasal spray USE 1 SPRAY IN THE NOSE DAILY AS DIRECTED  . calcium carbonate (OS-CAL) 600 MG TABS Take 1,200 mg by mouth daily.  [name]. ezetimibe (ZETIA) 10 MG tablet TAKE 1 TABLET BY MOUTH EVERY DAY  . Multiple Vitamin (MULTIVITAMIN) tablet Take 1 tablet by mouth daily.      Past Medical History  Diagnosis Date  . Hyperlipidemia   . Mitral regurgitation   . Mitral valve prolapse     Past Surgical History  Procedure Laterality Date  .  Breast biopsy  1980    normal  . Hospitalized for endocarditis  1987  . Biopsy breast  1980    left ,  benign    Social History  reports that she has never smoked. She has never used smokeless tobacco. She reports that she drinks alcohol. She reports that she does not use illicit drugs.  Family History family history includes Cancer in her sister; Coronary artery disease in her father; Heart attack in her father; Heart disease in her brother.   Review of Systems  Constitutional: Negative.   Respiratory: Negative.   Cardiovascular: Negative.   Gastrointestinal: Negative.   Musculoskeletal: Negative.   Skin: Negative.   Neurological: Negative.   Hematological: Negative.   Psychiatric/Behavioral: Negative.   All other systems reviewed and are negative.  BP 138/62 mmHg  Pulse 64  Ht 5\' 1"  (1.549 m)  Wt 110 lb (49.896 kg)  BMI 20.80 kg/m2  Physical Exam  Constitutional: She is oriented to person, place, and time. She appears well-developed and well-nourished.  HENT:  Head: Normocephalic.  Nose: Nose normal.  Mouth/Throat: Oropharynx is clear and moist.  Eyes: Conjunctivae are normal. Pupils are equal, round, and reactive to light.  Neck: Normal range of motion. Neck supple. No JVD present.  Cardiovascular: Normal rate, regular rhythm, S1 normal, S2 normal and intact distal pulses.  Frequent extrasystoles are present. Exam reveals no gallop and  no friction rub.   Murmur heard.  Crescendo systolic murmur is present with a grade of 3/6  Pulmonary/Chest: Effort normal and breath sounds normal. No respiratory distress. She has no wheezes. She has no rales. She exhibits no tenderness.  Abdominal: Soft. Bowel sounds are normal. She exhibits no distension. There is no tenderness.  Musculoskeletal: Normal range of motion. She exhibits no edema or tenderness.  Lymphadenopathy:    She has no cervical adenopathy.  Neurological: She is alert and oriented to person, place, and time.  Coordination normal.  Skin: Skin is warm and dry. No rash noted. No erythema.  Psychiatric: She has a normal mood and affect. Her behavior is normal. Judgment and thought content normal.    Assessment and Plan  Nursing note and vitals reviewed.

## 2014-10-03 NOTE — Assessment & Plan Note (Signed)
Cholesterol is at goal on the current lipid regimen. No changes to the medications were made.  

## 2014-10-03 NOTE — Assessment & Plan Note (Signed)
She denies any new symptoms at this time. Murmur appreciated on exam. Prior echocardiogram with mild to moderate MR. Echocardiogram offered but she feels well with no complaints, she has declined at this time. Suggested she call our office for worsening leg edema or shortness of breath. Lasix will be offered

## 2014-10-03 NOTE — Assessment & Plan Note (Signed)
Stable trace edema around the ankles bilaterally. Recommended compression hose if edema gets worse as well as call the office as she may need Lasix

## 2014-10-03 NOTE — Patient Instructions (Signed)
You are doing well. No medication changes were made.  Please call the office you have shortness of breath, or leg swelling.  Please call us if you have new issues that need to be addressed before your next appt.  Your physician wants you to follow-up in: 12 months.  You will receive a reminder letter in the mail two months in advance. If you don't receive a letter, please call our office to schedule the follow-up appointment.

## 2014-10-27 ENCOUNTER — Telehealth: Payer: Self-pay | Admitting: Internal Medicine

## 2014-10-27 DIAGNOSIS — B029 Zoster without complications: Secondary | ICD-10-CM | POA: Diagnosis not present

## 2014-10-27 NOTE — Telephone Encounter (Signed)
i can work her in today

## 2014-10-27 NOTE — Telephone Encounter (Signed)
FYI

## 2014-10-27 NOTE — Telephone Encounter (Signed)
Spoke with pt, she states she was seen at [facility] and was diagnosed with Shingles.  Was not treated with medication due to outbreak being longer than one week.  However she was given medication for pain which she states she does not need at this time.

## 2014-10-27 NOTE — Telephone Encounter (Signed)
Patient Name: Brandi [name]  DOB: 1927-01-25    Initial Comment Caller states she has a rash on her neck, back, and the right side of the chest and shoulder   Nurse Assessment  Nurse: Sherilyn CooterHenry, RN, Thurmond ButtsWade Date/Time Lamount Cohen(Eastern Time): 10/27/2014 10:47:56 AM  Confirm and document reason for call. If symptomatic, describe symptoms. ---Caller states she has a rash on her neck, back, and the right side of the chest and shoulder. Some of her neighbors have had this as well. She describes the rash as red raised bumps. Its "terribly itchy." She has had the rash for over a week now. Denies fever.  Has the patient traveled out of the country within the last 30 days? ---No  Does the patient require triage? ---Yes  Related visit to physician within the last 2 weeks? ---No  Does the PT have any chronic conditions? (i.e. diabetes, asthma, etc.) ---Yes  List chronic conditions. ---Hypercholesterolemia     Guidelines    Guideline Title Affirmed Question Affirmed Notes  Rash or Redness - Widespread Severe itching    Final Disposition User   See Physician within 24 Hours Sherilyn CooterHenry, RN, Thurmond ButtsWade    Comments  Attempted to [facility] an appointment [facility]. Offered to check [facility] and [place], she states that the other offices are too far away. She may go to an UC to be seen.

## 2014-10-31 ENCOUNTER — Other Ambulatory Visit: Payer: Self-pay | Admitting: Internal Medicine

## 2014-12-14 DIAGNOSIS — H2513 Age-related nuclear cataract, bilateral: Secondary | ICD-10-CM | POA: Diagnosis not present

## 2015-01-03 ENCOUNTER — Other Ambulatory Visit: Payer: Self-pay | Admitting: Internal Medicine

## 2015-02-21 ENCOUNTER — Ambulatory Visit (INDEPENDENT_AMBULATORY_CARE_PROVIDER_SITE_OTHER): Payer: Medicare Other | Admitting: Internal Medicine

## 2015-02-21 ENCOUNTER — Encounter: Payer: Self-pay | Admitting: Internal Medicine

## 2015-02-21 VITALS — BP 112/62 | HR 74 | Temp 97.6°F | Ht 61.0 in | Wt 110.1 lb

## 2015-02-21 DIAGNOSIS — S62102A Fracture of unspecified carpal bone, left wrist, initial encounter for closed fracture: Secondary | ICD-10-CM

## 2015-02-21 DIAGNOSIS — S81001A Unspecified open wound, right knee, initial encounter: Secondary | ICD-10-CM

## 2015-02-21 DIAGNOSIS — S81809A Unspecified open wound, unspecified lower leg, initial encounter: Secondary | ICD-10-CM

## 2015-02-21 DIAGNOSIS — M25532 Pain in left wrist: Secondary | ICD-10-CM | POA: Diagnosis not present

## 2015-02-21 DIAGNOSIS — S91009A Unspecified open wound, unspecified ankle, initial encounter: Secondary | ICD-10-CM

## 2015-02-21 DIAGNOSIS — M25539 Pain in unspecified wrist: Secondary | ICD-10-CM | POA: Insufficient documentation

## 2015-02-21 DIAGNOSIS — S91001A Unspecified open wound, right ankle, initial encounter: Secondary | ICD-10-CM | POA: Diagnosis not present

## 2015-02-21 DIAGNOSIS — S81009A Unspecified open wound, unspecified knee, initial encounter: Secondary | ICD-10-CM | POA: Insufficient documentation

## 2015-02-21 DIAGNOSIS — S81801A Unspecified open wound, right lower leg, initial encounter: Secondary | ICD-10-CM

## 2015-02-21 MED ORDER — CALCITONIN (SALMON) 200 UNIT/ACT NA SOLN: NASAL | Status: DC

## 2015-02-21 NOTE — Progress Notes (Signed)
Subjective:  Patient ID: Brandi [name], female    DOB: 10/11/1926  Age: 81 y.o. MRN: [id number]  CC: The primary encounter diagnosis was Wrist pain, acute, left. Diagnoses of Open wound of knee, leg (except thigh), and ankle, complicated, right, initial encounter, Acute wrist pain, left, and Wrist fracture, left, closed, initial encounter were also pertinent to this visit.  HPI Brandi [name] presents for follow up on hyperlipidemia and osteoporosis. She had a minor fall while working in her vegetable garden  4 days ago and injured her left wrist.  She noted swelling and extensive bruising but no significant pain  After the first day so did not go to ER or Urgent care.  Instead she iced it for 3 days.    Outpatient Prescriptions Prior to Visit  Medication Sig Dispense Refill  . aspirin 81 MG tablet Takes three times a week    . calcium carbonate (OS-CAL) 600 MG TABS Take 1,200 mg by mouth daily.    . Multiple Vitamin (MULTIVITAMIN) tablet Take 1 tablet by mouth daily.      [name]. ZETIA 10 MG tablet TAKE 1 TABLET BY MOUTH EVERY DAY 90 tablet 1  . calcitonin, salmon, (MIACALCIN/FORTICAL) 200 UNIT/ACT nasal spray USE 1 SPRAY IN THE NOSE DAILY AS DIRECTED 11.1 mL 2  . ezetimibe (ZETIA) 10 MG tablet TAKE 1 TABLET BY MOUTH EVERY DAY 90 tablet 1  . ZETIA 10 MG tablet TAKE 1 TABLET BY MOUTH EVERY DAY 90 tablet 0   No facility-administered medications prior to visit.    Review of Systems;  Patient denies headache, fevers, malaise, unintentional weight loss, skin rash, eye pain, sinus congestion and sinus pain, sore throat, dysphagia,  hemoptysis , cough, dyspnea, wheezing, chest pain, palpitations, orthopnea, edema, abdominal pain, nausea, melena, diarrhea, constipation, flank pain, dysuria, hematuria, urinary  Frequency, nocturia, numbness, tingling, seizures,  Focal weakness, Loss of consciousness,  Tremor, insomnia, depression, anxiety, and suicidal ideation.      Objective:  BP 112/62 mmHg  Pulse  74  Temp(Src) 97.6 F (36.4 C) (Oral)  Ht 5\' 1"  (1.549 m)  Wt 110 lb 2 oz (49.952 kg)  BMI 20.82 kg/m2  SpO2 97%  BP Readings from Last 3 Encounters:  02/21/15 112/62  10/03/14 138/62  08/21/14 128/70    Wt Readings from Last 3 Encounters:  02/21/15 110 lb 2 oz (49.952 kg)  10/03/14 110 lb (49.896 kg)  08/21/14 107 lb 12 oz (48.875 kg)    General appearance: alert, cooperative and appears stated age Neck: no adenopathy, no carotid bruit, supple, symmetrical, trachea midline and thyroid not enlarged, symmetric, no tenderness/mass/nodules Back: symmetric, no curvature. ROM normal. No spinal or CVA tenderness. Lungs: clear to auscultation bilaterally Heart: regular rate and rhythm, S1, S2 normal, no murmur, click, rub or gallop MSK:  Left forearm has a ecchymosis from wrist to elbow.  Mild tenderness to palpation at the wrist and with resisted flexion of 5th finger  Skin: left leg with open wound,  Adherent devitalized skin covering 50% of wound  Pulses: 2+ and symmetric   No results found for: HGBA1C  Lab Results  Component Value Date   CREATININE 0.8 08/24/2014   CREATININE 1.1 01/30/2014   CREATININE 0.7 07/28/2013    Lab Results  Component Value Date   WBC 6.4 01/30/2014   HGB 13.8 01/30/2014   HCT 41.2 01/30/2014   PLT 237.0 01/30/2014   GLUCOSE 86 08/24/2014   CHOL 169 08/24/2014   TRIG 86.0 08/24/2014  HDL 55.50 08/24/2014   LDLDIRECT 164.8 07/31/2011   LDLCALC 96 08/24/2014   ALT 25 08/24/2014   AST 35 08/24/2014   NA 139 08/24/2014   K 4.4 08/24/2014   CL 106 08/24/2014   CREATININE 0.8 08/24/2014   BUN 13 08/24/2014   CO2 31 08/24/2014   TSH 1.21 01/30/2014    No results found.  Assessment & Plan:   Problem List Items Addressed This Visit      Unprioritized   Open wound of knee, leg (except thigh), and ankle, complicated    She has an wound on her right leg that occurred during the fall htat was debrided of devitalized skin down to good  granulating tissue.        Acute wrist pain    Secondary to injury from fall.  Given her extensive bruising and history of osteoporosis,  She was sent for plain films.      Wrist fracture, left    She has a minimally displaced fracture along the volar aspect of the distal left radius with extension into the radiocarpal joint space.   Patient was brought back to clinic and placed in a left wrist brace ,  Advised to wear it 24/7 except when showering.  Referral to Cay Schillings, [facility] , is in process.       Other Visit Diagnoses    Wrist pain, acute, left    -  Primary    Relevant Orders    DG Wrist Complete Left (Completed)      A total of 40 minutes was spent with patient more than half of which was spent in counseling patient on the above mentioned issues , reviewing and explaining imaging studies done, and coordination of care.  I have discontinued Ms. Mcvey's ezetimibe. I am also having her maintain her multivitamin, aspirin, calcium carbonate, ZETIA, and calcitonin (salmon).  Meds ordered this encounter  Medications  . calcitonin, salmon, (MIACALCIN/FORTICAL) 200 UNIT/ACT nasal spray    Sig: USE 1 SPRAY IN THE NOSE DAILY AS DIRECTED    Dispense:  11.1 mL    Refill:  2    Medications Discontinued During This Encounter  Medication Reason  . ZETIA 10 MG tablet Duplicate  . ezetimibe (ZETIA) 10 MG tablet Duplicate  . calcitonin, salmon, (MIACALCIN/FORTICAL) 200 UNIT/ACT nasal spray Reorder    Follow-up: Return in about 6 months (around 08/24/2015).   [name], MD

## 2015-02-21 NOTE — Progress Notes (Signed)
Pre visit review using our clinic review tool, if applicable. No additional management support is needed unless otherwise documented below in the visit note. 

## 2015-02-21 NOTE — Patient Instructions (Addendum)
No labs are required at this time.  I debrided your wound so that it will heal faster  You can change the Tegaderm every 3 days, sooner if you notice fluid accumulating under the surface.    You are up to date on your tetanus shot  Please get the x rays of the left wrist before you return to your exercise class.

## 2015-02-22 ENCOUNTER — Encounter: Payer: Self-pay | Admitting: Internal Medicine

## 2015-02-22 ENCOUNTER — Ambulatory Visit
Admission: RE | Admit: 2015-02-22 | Discharge: 2015-02-22 | Disposition: A | Discharge status: Home / Self Care | Payer: Medicare Other | Source: Ambulatory Visit | Attending: Internal Medicine | Admitting: Internal Medicine

## 2015-02-22 DIAGNOSIS — M25532 Pain in left wrist: Secondary | ICD-10-CM | POA: Diagnosis present

## 2015-02-22 DIAGNOSIS — S52502A Unspecified fracture of the lower end of left radius, initial encounter for closed fracture: Secondary | ICD-10-CM | POA: Diagnosis not present

## 2015-02-22 DIAGNOSIS — X58XXXA Exposure to other specified factors, initial encounter: Secondary | ICD-10-CM | POA: Insufficient documentation

## 2015-02-22 DIAGNOSIS — S5292XA Unspecified fracture of left forearm, initial encounter for closed fracture: Secondary | ICD-10-CM | POA: Insufficient documentation

## 2015-02-23 ENCOUNTER — Ambulatory Visit: Payer: Medicare Other

## 2015-02-23 ENCOUNTER — Other Ambulatory Visit: Payer: Self-pay | Admitting: Internal Medicine

## 2015-02-23 DIAGNOSIS — S62102A Fracture of unspecified carpal bone, left wrist, initial encounter for closed fracture: Secondary | ICD-10-CM | POA: Insufficient documentation

## 2015-02-23 NOTE — Assessment & Plan Note (Signed)
She has an wound on her right leg that occurred during the fall htat was debrided of devitalized skin down to good granulating tissue.

## 2015-02-23 NOTE — Assessment & Plan Note (Signed)
Secondary to injury from fall.  Given her extensive bruising and history of osteoporosis,  She was sent for plain films.

## 2015-02-23 NOTE — Assessment & Plan Note (Signed)
She has a minimally displaced fracture along the volar aspect of the distal left radius with extension into the radiocarpal joint space.   Patient was brought back to clinic and placed in a left wrist brace ,  Advised to wear it 24/7 except when showering.  Referral to Cay SchillingsMichel Menz, [facility] , is in process.

## 2015-02-28 ENCOUNTER — Other Ambulatory Visit: Payer: Self-pay | Admitting: Internal Medicine

## 2015-03-01 ENCOUNTER — Ambulatory Visit (INDEPENDENT_AMBULATORY_CARE_PROVIDER_SITE_OTHER): Payer: Medicare Other | Admitting: Podiatry

## 2015-03-01 VITALS — BP 132/71 | HR 71 | Resp 16

## 2015-03-01 DIAGNOSIS — M2040 Other hammer toe(s) (acquired), unspecified foot: Secondary | ICD-10-CM | POA: Diagnosis not present

## 2015-03-01 DIAGNOSIS — L84 Corns and callosities: Secondary | ICD-10-CM | POA: Diagnosis not present

## 2015-03-01 NOTE — Progress Notes (Signed)
Patient ID: Brandi [name], female   DOB: 1927/05/13, 81 y.o.   MRN: [id number]  Subjective: 81 year old female presents the office today with concerns of a painful left fourth toe. She states that around the toenail she has a thick callus which causes some discomfort. She has pain to the area particular pressure in certain shoes. She denies any redness or drainage to the area. She denies any recent injury or trauma. She said no recent treatment for this. She says that she previously had this area cut out before however she was told it would come back. No other complaints at this time.  Objective: AAO 3, NAD DP/PT pulses palpable bilaterally, CRT less than 3 seconds Protective sensation intact with Simms Weinstein monofilament, Achilles tendon reflex intact. There is lateral deviation of the digits on the left. There is mild hammertoe contractures present bilaterally. There is tenderness to palpation along the lateral aspect of the left fourth digit adjacent to the toenail. Overlying this area there is an area of hyperkeratotic tissue. Upon debridement underlying ulceration. There is no significant erythema to the areano ascending cellulitis. No areas of fluctuance or crepitus. No drainage or purulence. No malodor. No other areas of tenderness to bilateral lower tibias. There is no overlying edema, erythema, increase in warmth to bilateral lower extremities. No pain with calf compression, swelling, warmth, erythema.  Assessment: 81 year old female left fourth digit hyperkeratotic lesion.  Plan: -Treatment options discussed including all alternatives, risks, and complications -Hyperkeratotic lesion shop and debrided. The lesion appeared to be thick and upon debridement there is a small minor bleeding. The area was cleaned and antibiotic laden was placed followed by a bandage. Continue this at home.  -Dispensed offloading pad. Follow-up as needed. Also discussed the patient is any problems around the  area of the minimal bleeding does not heal within the next 1-2 weeks to call the office. Monitor for any clinical signs or symptoms of infection and directed to call the office immediately should any occur or go to the ER. Otherwise, follow-up as needed. In the meantime call the office with any questions, concerns, change in symptoms.  Ovid CurdMatthew Wagoner, DPM

## 2015-03-02 DIAGNOSIS — S6992XA Unspecified injury of left wrist, hand and finger(s), initial encounter: Secondary | ICD-10-CM | POA: Diagnosis not present

## 2015-03-02 DIAGNOSIS — S52552D Other extraarticular fracture of lower end of left radius, subsequent encounter for closed fracture with routine healing: Secondary | ICD-10-CM | POA: Diagnosis not present

## 2015-03-16 DIAGNOSIS — M25532 Pain in left wrist: Secondary | ICD-10-CM | POA: Diagnosis not present

## 2015-03-16 DIAGNOSIS — S52031D Displaced fracture of olecranon process with intraarticular extension of right ulna, subsequent encounter for closed fracture with routine healing: Secondary | ICD-10-CM | POA: Diagnosis not present

## 2015-04-02 ENCOUNTER — Other Ambulatory Visit: Payer: Self-pay | Admitting: Internal Medicine

## 2015-06-11 DIAGNOSIS — H903 Sensorineural hearing loss, bilateral: Secondary | ICD-10-CM | POA: Diagnosis not present

## 2015-06-14 DIAGNOSIS — Z23 Encounter for immunization: Secondary | ICD-10-CM | POA: Diagnosis not present

## 2015-08-14 ENCOUNTER — Telehealth: Payer: Self-pay | Admitting: Internal Medicine

## 2015-08-14 DIAGNOSIS — E785 Hyperlipidemia, unspecified: Secondary | ICD-10-CM

## 2015-08-14 DIAGNOSIS — Z0000 Encounter for general adult medical examination without abnormal findings: Secondary | ICD-10-CM

## 2015-08-14 NOTE — Telephone Encounter (Signed)
Fasting labs ordered need anything else? Patient has lab appointment Thursday.

## 2015-08-14 NOTE — Telephone Encounter (Signed)
Thanks!  All good

## 2015-08-21 ENCOUNTER — Ambulatory Visit (INDEPENDENT_AMBULATORY_CARE_PROVIDER_SITE_OTHER): Payer: Medicare Other | Admitting: Podiatry

## 2015-08-21 ENCOUNTER — Encounter: Payer: Self-pay | Admitting: Podiatry

## 2015-08-21 VITALS — BP 155/74 | HR 55 | Resp 18

## 2015-08-21 DIAGNOSIS — L84 Corns and callosities: Secondary | ICD-10-CM

## 2015-08-21 DIAGNOSIS — M2040 Other hammer toe(s) (acquired), unspecified foot: Secondary | ICD-10-CM

## 2015-08-21 NOTE — Progress Notes (Signed)
Subjective: 88 roping upper since the office today for recurrence of callus of the end of the fourth toe and the left side. Denies any drainage or pus. The area becomes painful with pressure to gear. Denies any systemic complaints such as fevers, chills, nausea, vomiting. No acute changes since last appointment, and no other complaints at this time.   Objective: AAO x3, NAD DP/PT pulses palpable bilaterally, CRT less than 3 seconds Protective sensation intact with Dorann OuSimms Weinstein monofilament Hyperkeratotic lesion present the distal aspect the left fourth toe. Upon debridement no underlying ulceration, drainage or other signs of infection. There is hammertoe contracture present. There are small hyperkeratotic lesion submetatarsal area. Upon abatement no underlying ulceration, drainage or other signs of infection. No areas of pinpoint bony tenderness or pain with vibratory sensation. MMT 5/5, ROM WNL. No edema, erythema, increase in warmth to bilateral lower extremities.  No open lesions or pre-ulcerative lesions.  No pain with calf compression, swelling, warmth, erythema  Assessment: 82 year old female with symptomatic left fourth toe hyperkeratotic lesion due to underlying hammertoe  Plan: -All treatment options discussed with the patient including all alternatives, risks, complications.  -Hyperkeratotic lesions debrided 2 without complication/bleeding. Offloading pads were dispensed. She wishes to hold off on any surgical intervention or further treatment and wishes us to have. Trimmed periodically. Follow-up as needed. -Patient encouraged to call the office with any questions, concerns, change in symptoms.   Ovid CurdMatthew Wagoner, DPM

## 2015-08-23 ENCOUNTER — Other Ambulatory Visit (INDEPENDENT_AMBULATORY_CARE_PROVIDER_SITE_OTHER): Payer: Medicare Other

## 2015-08-23 DIAGNOSIS — M810 Age-related osteoporosis without current pathological fracture: Secondary | ICD-10-CM | POA: Diagnosis not present

## 2015-08-23 DIAGNOSIS — E785 Hyperlipidemia, unspecified: Secondary | ICD-10-CM

## 2015-08-23 DIAGNOSIS — Z0000 Encounter for general adult medical examination without abnormal findings: Secondary | ICD-10-CM

## 2015-08-23 LAB — COMPREHENSIVE METABOLIC PANEL
ALT: 21 U/L (ref 0–35)
AST: 33 U/L (ref 0–37)
Albumin: 3.6 g/dL (ref 3.5–5.2)
Alkaline Phosphatase: 79 U/L (ref 39–117)
BUN: 13 mg/dL (ref 6–23)
CALCIUM: 9.6 mg/dL (ref 8.4–10.5)
CHLORIDE: 106 meq/L (ref 96–112)
CO2: 31 meq/L (ref 19–32)
CREATININE: 0.81 mg/dL (ref 0.40–1.20)
GFR: 71.25 mL/min (ref >60.00)
GLUCOSE: 90 mg/dL (ref 70–99)
Potassium: 4.2 mEq/L (ref 3.5–5.1)
Sodium: 141 mEq/L (ref 135–145)
Total Bilirubin: 0.5 mg/dL (ref 0.2–1.2)
Total Protein: 6.5 g/dL (ref 6.0–8.3)

## 2015-08-23 LAB — CBC WITH DIFFERENTIAL/PLATELET
BASOS ABS: 0 10*3/uL (ref 0.0–0.1)
BASOS PCT: 0.6 % (ref 0.0–3.0)
EOS ABS: 0.2 10*3/uL (ref 0.0–0.7)
Eosinophils Relative: 3.6 % (ref 0.0–5.0)
HEMATOCRIT: 43 % (ref 36.0–46.0)
Hemoglobin: 14.1 g/dL (ref 12.0–15.0)
LYMPHS ABS: 1.7 10*3/uL (ref 0.7–4.0)
LYMPHS PCT: 27.7 % (ref 12.0–46.0)
MCHC: 32.9 g/dL (ref 30.0–36.0)
MCV: 94.3 fl (ref 78.0–100.0)
Monocytes Absolute: 0.6 10*3/uL (ref 0.1–1.0)
Monocytes Relative: 10.5 % (ref 3.0–12.0)
NEUTROS ABS: 3.5 10*3/uL (ref 1.4–7.7)
NEUTROS PCT: 57.6 % (ref 43.0–77.0)
PLATELETS: 239 10*3/uL (ref 150.0–400.0)
RBC: 4.56 Mil/uL (ref 3.87–5.11)
RDW: 15 % (ref 11.5–15.5)
WBC: 6.1 10*3/uL (ref 4.0–10.5)

## 2015-08-23 LAB — VITAMIN D 25 HYDROXY (VIT D DEFICIENCY, FRACTURES): VITD: 49.57 ng/mL (ref 30.00–100.00)

## 2015-08-23 LAB — LIPID PANEL
CHOL/HDL RATIO: 3
Cholesterol: 180 mg/dL (ref 0–200)
HDL: 61.3 mg/dL (ref >39.00)
LDL CALC: 103 mg/dL — AB (ref 0–99)
NonHDL: 118.5
Triglycerides: 79 mg/dL (ref 0.0–149.0)
VLDL: 15.8 mg/dL (ref 0.0–40.0)

## 2015-08-23 LAB — TSH: TSH: 1.93 u[IU]/mL (ref 0.35–4.50)

## 2015-08-25 ENCOUNTER — Encounter: Payer: Self-pay | Admitting: Internal Medicine

## 2015-08-27 ENCOUNTER — Ambulatory Visit: Payer: BLUE CROSS/BLUE SHIELD

## 2015-09-06 ENCOUNTER — Ambulatory Visit (INDEPENDENT_AMBULATORY_CARE_PROVIDER_SITE_OTHER): Payer: Medicare Other

## 2015-09-06 ENCOUNTER — Other Ambulatory Visit: Payer: Self-pay | Admitting: Internal Medicine

## 2015-09-06 VITALS — BP 110/62 | HR 63 | Temp 97.7°F | Resp 12 | Ht 59.0 in | Wt 110.1 lb

## 2015-09-06 DIAGNOSIS — Z0000 Encounter for general adult medical examination without abnormal findings: Secondary | ICD-10-CM | POA: Diagnosis not present

## 2015-09-06 NOTE — Patient Instructions (Addendum)
Brandi [name],  Thank you for taking time to come for your Medicare Wellness Visit.  I appreciate your ongoing commitment to your health goals. Please review the following plan we discussed and let me know if I can assist you in the future.  Return in February for 6 mo follow up with Dr. Derrel Nip   Health Maintenance, Female Adopting a healthy lifestyle and getting preventive care can go a long way to promote health and wellness. Talk with your health care provider about what schedule of regular examinations is right for you. This is a good chance for you to check in with your provider about disease prevention and staying healthy. In between checkups, there are plenty of things you can do on your own. Experts have done a lot of research about which lifestyle changes and preventive measures are most likely to keep you healthy. Ask your health care provider for more information. WEIGHT AND DIET  Eat a healthy diet  Be sure to include plenty of vegetables, fruits, low-fat dairy products, and lean protein.  Do not eat a lot of foods high in solid fats, added sugars, or salt.  Get regular exercise. This is one of the most important things you can do for your health.  Most adults should exercise for at least 150 minutes each week. The exercise should increase your heart rate and make you sweat (moderate-intensity exercise).  Most adults should also do strengthening exercises at least twice a week. This is in addition to the moderate-intensity exercise.  Maintain a healthy weight  Body mass index (BMI) is a measurement that can be used to identify possible weight problems. It estimates body fat based on height and weight. Your health care provider can help determine your BMI and help you achieve or maintain a healthy weight.  For females 17 years of age and older:   A BMI below 18.5 is considered underweight.  A BMI of 18.5 to 24.9 is normal.  A BMI of 25 to 29.9 is considered overweight.  A  BMI of 30 and above is considered obese.  Watch levels of cholesterol and blood lipids  You should start having your blood tested for lipids and cholesterol at 80 years of age, then have this test every 5 years.  You may need to have your cholesterol levels checked more often if:  Your lipid or cholesterol levels are high.  You are older than 80 years of age.  You are at high risk for heart disease.  CANCER SCREENING   Lung Cancer  Lung cancer screening is recommended for adults 22-3 years old who are at high risk for lung cancer because of a history of smoking.  A yearly low-dose CT scan of the lungs is recommended for people who:  Currently smoke.  Have quit within the past 15 years.  Have at least a 30-pack-year history of smoking. A pack year is smoking an average of one pack of cigarettes a day for 1 year.  Yearly screening should continue until it has been 15 years since you quit.  Yearly screening should stop if you develop a health problem that would prevent you from having lung cancer treatment.  Breast Cancer  Practice breast self-awareness. This means understanding how your breasts normally appear and feel.  It also means doing regular breast self-exams. Let your health care provider know about any changes, no matter how small.  If you are in your 20s or 30s, you should have a clinical breast exam (CBE)  by a health care provider every 1-3 years as part of a regular health exam.  If you are 40 or older, have a CBE every year. Also consider having a breast X-ray (mammogram) every year.  If you have a family history of breast cancer, talk to your health care provider about genetic screening.  If you are at high risk for breast cancer, talk to your health care provider about having an MRI and a mammogram every year.  Breast cancer gene (BRCA) assessment is recommended for women who have family members with BRCA-related cancers. BRCA-related cancers  include:  Breast.  Ovarian.  Tubal.  Peritoneal cancers.  Results of the assessment will determine the need for genetic counseling and BRCA1 and BRCA2 testing. Cervical Cancer Your health care provider may recommend that you be screened regularly for cancer of the pelvic organs (ovaries, uterus, and vagina). This screening involves a pelvic examination, including checking for microscopic changes to the surface of your cervix (Pap test). You may be encouraged to have this screening done every 3 years, beginning at age 21.  For women ages 30-65, health care providers may recommend pelvic exams and Pap testing every 3 years, or they may recommend the Pap and pelvic exam, combined with testing for human papilloma virus (HPV), every 5 years. Some types of HPV increase your risk of cervical cancer. Testing for HPV may also be done on women of any age with unclear Pap test results.  Other health care providers may not recommend any screening for nonpregnant women who are considered low risk for pelvic cancer and who do not have symptoms. Ask your health care provider if a screening pelvic exam is right for you.  If you have had past treatment for cervical cancer or a condition that could lead to cancer, you need Pap tests and screening for cancer for at least 20 years after your treatment. If Pap tests have been discontinued, your risk factors (such as having a new sexual partner) need to be reassessed to determine if screening should resume. Some women have medical problems that increase the chance of getting cervical cancer. In these cases, your health care provider may recommend more frequent screening and Pap tests. Colorectal Cancer  This type of cancer can be detected and often prevented.  Routine colorectal cancer screening usually begins at 80 years of age and continues through 80 years of age.  Your health care provider may recommend screening at an earlier age if you have risk factors for  colon cancer.  Your health care provider may also recommend using home test kits to check for hidden blood in the stool.  A small camera at the end of a tube can be used to examine your colon directly (sigmoidoscopy or colonoscopy). This is done to check for the earliest forms of colorectal cancer.  Routine screening usually begins at age 50.  Direct examination of the colon should be repeated every 5-10 years through 80 years of age. However, you may need to be screened more often if early forms of precancerous polyps or small growths are found. Skin Cancer  Check your skin from head to toe regularly.  Tell your health care provider about any new moles or changes in moles, especially if there is a change in a mole's shape or color.  Also tell your health care provider if you have a mole that is larger than the size of a pencil eraser.  Always use sunscreen. Apply sunscreen liberally and repeatedly throughout the   day.  Protect yourself by wearing long sleeves, pants, a wide-brimmed hat, and sunglasses whenever you are outside. HEART DISEASE, DIABETES, AND HIGH BLOOD PRESSURE   High blood pressure causes heart disease and increases the risk of stroke. High blood pressure is more likely to develop in:  People who have blood pressure in the high end of the normal range (130-139/85-89 mm Hg).  People who are overweight or obese.  People who are African American.  If you are 18-39 years of age, have your blood pressure checked every 3-5 years. If you are 40 years of age or older, have your blood pressure checked every year. You should have your blood pressure measured twice--once when you are at a hospital or clinic, and once when you are not at a hospital or clinic. Record the average of the two measurements. To check your blood pressure when you are not at a hospital or clinic, you can use:  An automated blood pressure machine at a pharmacy.  A home blood pressure monitor.  If you  are between 55 years and 79 years old, ask your health care provider if you should take aspirin to prevent strokes.  Have regular diabetes screenings. This involves taking a blood sample to check your fasting blood sugar level.  If you are at a normal weight and have a low risk for diabetes, have this test once every three years after 80 years of age.  If you are overweight and have a high risk for diabetes, consider being tested at a younger age or more often. PREVENTING INFECTION  Hepatitis B  If you have a higher risk for hepatitis B, you should be screened for this virus. You are considered at high risk for hepatitis B if:  You were born in a country where hepatitis B is common. Ask your health care provider which countries are considered high risk.  Your parents were born in a high-risk country, and you have not been immunized against hepatitis B (hepatitis B vaccine).  You have HIV or AIDS.  You use needles to inject street drugs.  You live with someone who has hepatitis B.  You have had sex with someone who has hepatitis B.  You get hemodialysis treatment.  You take certain medicines for conditions, including cancer, organ transplantation, and autoimmune conditions. Hepatitis C  Blood testing is recommended for:  Everyone born from 1945 through 1965.  Anyone with known risk factors for hepatitis C. Sexually transmitted infections (STIs)  You should be screened for sexually transmitted infections (STIs) including gonorrhea and chlamydia if:  You are sexually active and are younger than 80 years of age.  You are older than 80 years of age and your health care provider tells you that you are at risk for this type of infection.  Your sexual activity has changed since you were last screened and you are at an increased risk for chlamydia or gonorrhea. Ask your health care provider if you are at risk.  If you do not have HIV, but are at risk, it may be recommended that you  take a prescription medicine daily to prevent HIV infection. This is called pre-exposure prophylaxis (PrEP). You are considered at risk if:  You are sexually active and do not regularly use condoms or know the HIV status of your partner(s).  You take drugs by injection.  You are sexually active with a partner who has HIV. Talk with your health care provider about whether you are at high risk of   being infected with HIV. If you choose to begin PrEP, you should first be tested for HIV. You should then be tested every 3 months for as long as you are taking PrEP.  PREGNANCY   If you are premenopausal and you may become pregnant, ask your health care provider about preconception counseling.  If you may become pregnant, take 400 to 800 micrograms (mcg) of folic acid every day.  If you want to prevent pregnancy, talk to your health care provider about birth control (contraception). OSTEOPOROSIS AND MENOPAUSE   Osteoporosis is a disease in which the bones lose minerals and strength with aging. This can result in serious bone fractures. Your risk for osteoporosis can be identified using a bone density scan.  If you are 65 years of age or older, or if you are at risk for osteoporosis and fractures, ask your health care provider if you should be screened.  Ask your health care provider whether you should take a calcium or vitamin D supplement to lower your risk for osteoporosis.  Menopause may have certain physical symptoms and risks.  Hormone replacement therapy may reduce some of these symptoms and risks. Talk to your health care provider about whether hormone replacement therapy is right for you.  HOME CARE INSTRUCTIONS   Schedule regular health, dental, and eye exams.  Stay current with your immunizations.   Do not use any tobacco products including cigarettes, chewing tobacco, or electronic cigarettes.  If you are pregnant, do not drink alcohol.  If you are breastfeeding, limit how  much and how often you drink alcohol.  Limit alcohol intake to no more than 1 drink per day for nonpregnant women. One drink equals 12 ounces of beer, 5 ounces of wine, or 1 ounces of hard liquor.  Do not use street drugs.  Do not share needles.  Ask your health care provider for help if you need support or information about quitting drugs.  Tell your health care provider if you often feel depressed.  Tell your health care provider if you have ever been abused or do not feel safe at home.   This information is not intended to replace advice given to you by your health care provider. Make sure you discuss any questions you have with your health care provider.   Document Released: 02/17/2011 Document Revised: 08/25/2014 Document Reviewed: 07/06/2013 Elsevier Interactive Patient Education 2016 [employer]  

## 2015-09-06 NOTE — Progress Notes (Signed)
Subjective:   Brandi [name] is a 82 y.o. female who presents for Medicare Annual (Subsequent) preventive examination.  Review of Systems:  No ROS.  Medicare Wellness Visit.  Cardiac Risk Factors include: advanced age (>31men, >19 women)     Objective:     Vitals: BP 110/62 mmHg  Pulse 63  Temp(Src) 97.7 F (36.5 C) (Oral)  Resp 12  Ht  (1.499 m)  Wt 110 lb 1.9 oz (49.95 kg)  BMI 22.23 kg/m2  SpO2 99%  Tobacco History  Smoking status  . Never Smoker   Smokeless tobacco  . Never Used     Counseling given: Not Answered   Past Medical History  Diagnosis Date  . Hyperlipidemia   . Mitral regurgitation   . Mitral valve prolapse    Past Surgical History  Procedure Laterality Date  . Breast biopsy  1980    normal  . Hospitalized for endocarditis  1987  . Biopsy breast  1980    left ,  benign   Family History  Problem Relation Age of Onset  . Heart attack Father   . Coronary artery disease Father   . Cancer Sister     esophageal  . Heart disease Brother    History  Sexual Activity  . Sexual Activity: Not Currently    Outpatient Encounter Prescriptions as of 09/06/2015  Medication Sig  . aspirin 81 MG tablet Takes three times a week  . calcitonin, salmon, (MIACALCIN/FORTICAL) 200 UNIT/ACT nasal spray USE 1 SPRAY IN THE NOSE DAILY AS DIRECTED  . calcitonin, salmon, (MIACALCIN/FORTICAL) 200 UNIT/ACT nasal spray USE 1 SPRAY IN THE NOSE DAILY AS DIRECTED  . calcitonin, salmon, (MIACALCIN/FORTICAL) 200 UNIT/ACT nasal spray USE 1 SPRAY IN THE NOSE DAILY AS DIRECTED  . calcium carbonate (OS-CAL) 600 MG TABS Take 1,200 mg by mouth daily.  . Multiple Vitamin (MULTIVITAMIN) tablet Take 1 tablet by mouth daily.    [name] ZETIA 10 MG tablet TAKE 1 TABLET BY MOUTH EVERY DAY  . ZETIA 10 MG tablet TAKE 1 TABLET BY MOUTH EVERY DAY   No facility-administered encounter medications on file as of 09/06/2015.    Activities of Daily Living In your present state of health,  do you have any difficulty performing the following activities: 09/06/2015  Hearing? Y  Vision? N  Difficulty concentrating or making decisions? N  Walking or climbing stairs? N  Dressing or bathing? N  Doing errands, shopping? N  Preparing Food and eating ? N  Using the Toilet? N  In the past six months, have you accidently leaked urine? N  Do you have problems with loss of bowel control? N  Managing your Medications? N  Managing your Finances? N  Housekeeping or managing your Housekeeping? N    Patient Care Team: [name], MD as PCP - General (Internal Medicine) Antonieta Iba, MD as Consulting Physician (Cardiology)    Assessment:    This is a routine wellness examination for [name]. The goal of the wellness visit is to assist the patient how to close the gaps in care and create a preventative care plan for the patient.   Taking Calcium as appropriate/Osteoporosis risk reviewed.   Medications reviewed; taking without issues or barriers.   Safety issues reviewed; smoke detectors in the home. No firearms in the home. Wears seatbelts when driving or riding with others. No violence in the home.   No identified risk were noted; The patient was oriented x 3; appropriate in dress  and manner and no objective failures at ADL's or IADL's.   Microalbumin and A1C postponed for follow up with PCP.  Patient Concerns:  Requests to stop taking ZETIA for a 6 months at a time;  Questions the medication contributing to intermittent lower ABD pain.  No pain today.  Deferred to PCP for follow up.  Appointment scheduled.  Exercise Activities and Dietary recommendations Current Exercise Habits:: Structured exercise class, Frequency (Times/Week): 5, Intensity: Moderate  Goals    . Healthy Lifestyle     Stay hydrated, drink plenty of water Choose lean meats, fruits and vegetables Maintain exercise regiment      Fall Risk Fall Risk  09/06/2015 01/31/2014 01/30/2014 07/28/2012  Falls in  the past year? No No No No   Depression Screen PHQ 2/9 Scores 09/06/2015 01/31/2014 01/30/2014 07/28/2012  PHQ - 2 Score 0 0 1 0     Cognitive Testing MMSE - Mini Mental State Exam 09/06/2015  Orientation to time 5  Orientation to Place 5  Registration 3  Attention/ Calculation 5  Recall 3  Language- name 2 objects 2  Language- repeat 1  Language- follow 3 step command 3  Language- read & follow direction 1  Write a sentence 1  Copy design 1  Total score 30    Immunization History  Administered Date(s) Administered  . H1N1 10/12/2008  . Influenza Split 06/17/2011  . Influenza Whole 06/18/2008, 06/18/2009, 05/18/2010, 06/17/2010  . Influenza-Unspecified 05/18/2013, 05/23/2014, 06/18/2015  . Pneumococcal Conjugate-13 08/21/2014  . Pneumococcal Polysaccharide-23 08/18/1993, 07/28/2013  . Td 08/18/2005  . Tdap 12/27/2010  . Zoster 08/02/2010   Screening Tests Health Maintenance  Topic Date Due  . HEMOGLOBIN A1C  Aug 16, 1927  . URINE MICROALBUMIN  08/12/1939  . OPHTHALMOLOGY EXAM  09/19/2015  . INFLUENZA VACCINE  03/18/2016  . FOOT EXAM  08/20/2016  . TETANUS/TDAP  12/26/2020  . DEXA SCAN  Completed  . ZOSTAVAX  Completed  . PNA vac Low Risk Adult  Completed      Plan:    End of life planning; Advance aging; Advanced directives discussed. Copy requested of current HCPOA/Living Will.   During the course of the visit the patient was educated and counseled about the following appropriate screening and preventive services:   Vaccines to include Pneumoccal, Influenza, Hepatitis B, Td, Zostavax, HCV  Electrocardiogram  Cardiovascular Disease  Colorectal cancer screening  Bone density screening  Diabetes screening  Glaucoma screening  Mammography/PAP  Nutrition counseling   Patient Instructions (the written plan) was given to the patient.   Ashok Pall, LPN  1/61/0960

## 2015-09-07 NOTE — Progress Notes (Signed)
  I have reviewed the above information and agree with above.   Kaye Mitro, MD 

## 2015-09-10 NOTE — Telephone Encounter (Signed)
Mailed unread message to patient.  

## 2015-09-25 ENCOUNTER — Other Ambulatory Visit: Payer: Self-pay

## 2015-09-25 ENCOUNTER — Ambulatory Visit (INDEPENDENT_AMBULATORY_CARE_PROVIDER_SITE_OTHER): Payer: Medicare Other | Admitting: Internal Medicine

## 2015-09-25 ENCOUNTER — Encounter: Payer: Self-pay | Admitting: Internal Medicine

## 2015-09-25 VITALS — BP 132/70 | HR 75 | Temp 97.8°F | Resp 12 | Ht 61.0 in | Wt 108.8 lb

## 2015-09-25 DIAGNOSIS — Z0000 Encounter for general adult medical examination without abnormal findings: Secondary | ICD-10-CM

## 2015-09-25 MED ORDER — CALCITONIN (SALMON) 200 UNIT/ACT NA SOLN: NASAL | Status: DC

## 2015-09-25 MED ORDER — FAMCICLOVIR 500 MG PO TABS: 500.0000 mg | ORAL_TABLET | Freq: Three times a day (TID) | ORAL | Status: DC

## 2015-09-25 MED ORDER — EZETIMIBE 10 MG PO TABS: 10.0000 mg | ORAL_TABLET | Freq: Every day | ORAL | Status: DC

## 2015-09-25 NOTE — Progress Notes (Signed)
Pre-visit discussion using our clinic review tool. No additional management support is needed unless otherwise documented below in the visit note.  

## 2015-09-25 NOTE — Patient Instructions (Signed)
You are very well!!    I will see you in 6 months

## 2015-09-26 NOTE — Assessment & Plan Note (Signed)
Annual comprehensive preventive exam was done as well as an evaluation and management of chronic conditions .  During the course of the visit the patient was educated and counseled about appropriate screening and preventive services including :  diabetes screening, lipid analysis with projected  10 year  risk for CAD , nutrition counseling, breast, cervical and colorectal cancer screening, and recommended immunizations.  Printed recommendations for health maintenance screenings was given. She continues to defer screenings.

## 2015-09-26 NOTE — Progress Notes (Signed)
Patient ID: Brandi [name], female    DOB: 07-May-1927  Age: 82 y.o. MRN: [id number]  The patient is here for annual Medicare wellness examination and management of other chronic and acute problems.   The risk factors are reflected in the social history.  The roster of all physicians providing medical care to patient - is listed in the Snapshot section of the chart.  Activities of daily living:  The patient is 100% independent in all ADLs: dressing, toileting, feeding as well as independent mobility  Home safety : The patient has smoke detectors in the home. They wear seatbelts.  There are no firearms at home. There is no violence in the home.   There is no risks for hepatitis, STDs or HIV. There is no   history of blood transfusion. They have no travel history to infectious disease endemic areas of the world.  The patient has seen their dentist in the last six month. They have seen their eye doctor in the last year. They admit to slight hearing difficulty with regard to whispered voices and some television programs.  They have deferred audiologic testing in the last year.  They do not  have excessive sun exposure. Discussed the need for sun protection: hats, long sleeves and use of sunscreen if there is significant sun exposure.   Diet: the importance of a healthy diet is discussed. They do have a healthy diet.  The benefits of regular aerobic exercise were discussed. She walks 4 times per week ,  20 minutes.   Depression screen: there are no signs or vegative symptoms of depression- irritability, change in appetite, anhedonia, sadness/tearfullness.  Cognitive assessment: the patient manages all their financial and personal affairs and is actively engaged. They could relate day,date,year and events; recalled 2/3 objects at 3 minutes; performed clock-face test normally.  The following portions of the patient's history were reviewed and updated as appropriate: allergies, current medications, past  family history, past medical history,  past surgical history, past social history  and problem list.  Visual acuity was not assessed per patient preference since she has regular follow up with her ophthalmologist. Hearing and body mass index were assessed and reviewed.   During the course of the visit the patient was educated and counseled about appropriate screening and preventive services in6 month follow up.    Husband  Died 01-17-15so holidays are somber for her.  She  lives at [facility]. The activities there keep her busy and involved, has been there 7 years.  Has no family here.  Had shingles last February and could not get an appointment with anyone within the [facility] system  And was treated at Urgent Care . Right shoulder and right chest wall were affected.     Has lost 2 lbs.  Appetite is good. Gained 2 over Christmas.  Sleeping  6 hours per   Night .  Does not nap during the day   Rises at 5:40 and eats breakfast before her daily walk at 6:30 and walks a mile.7 days per week   Has a 9:00 exercise class for one hour 5 days week    Labs reviewed,  All good including cholesterol   Left Wrist feeling fine.  No fracture per orthopedics PA ,  No falls since then   Bilateral cataracts, eye exam in April.  6 month follow up.    Husband  Died 01/17/2015so holidays are somber for her.  She  lives at [facility].  The activities there keep her busy and involved, has been there 7 years.  Has no family here.  Had shingles last February and could not get an appointment with anyone within the [facility] system  And was treated at Urgent Care . Right shoulder and right chest wall were affected.     Has lost 2 lbs.  Appetite is good. Gained 2 over Christmas.  Sleeping  6 hours per   Night .  Does not nap during the day   Rises at 5:40 and eats breakfast before her daily walk at 6:30 and walks a mile.7 days per week   Has a 9:00 exercise class for one hour 5 days week    Labs reviewed,  All good  including cholesterol   Left Wrist feeling fine.  No fracture per orthopedics PA ,  No falls since then   Bilateral cataracts, eye exam in April.  cluding : fall prevention , diabetes screening, nutrition counseling, colorectal cancer screening, and recommended immunizations.    CC: The encounter diagnosis was Encounter for preventive health examination.    Husband  Died 02-06-2015so holidays are somber for her.  She  lives at [facility]. The activities there keep her busy and involved, has been there 7 years.  Has no family here.  Had shingles last February and could not get an appointment with anyone within the [facility] system  And was treated at Urgent Care . Right shoulder and right chest wall were affected.     Has lost 2 lbs.  Appetite is good. Gained 2 over Christmas.  Sleeping  6 hours per   Night .  Does not nap during the day   Rises at 5:40 and eats breakfast before her daily walk at 6:30 and walks a mile.7 days per week   Has a 9:00 exercise class for one hour 5 days week    Labs reviewed,  All good including cholesterol   Left Wrist feeling fine.  No fracture per orthopedics PA ,  No falls since then   Bilateral cataracts, eye exam in April.     History Brandi [name] has a past medical history of Hyperlipidemia; Mitral regurgitation; and Mitral valve prolapse.   She has past surgical history that includes Breast biopsy (1980); Hospitalized for endocarditis (1987); and Biopsy breast (1980).   Her family history includes Cancer in her sister; Coronary artery disease in her father; Heart attack in her father; Heart disease in her brother.She reports that she has never smoked. She has never used smokeless tobacco. She reports that she drinks alcohol. She reports that she does not use illicit drugs.  Outpatient Prescriptions Prior to Visit  Medication Sig Dispense Refill  . aspirin 81 MG tablet Takes three times a week    . calcitonin, salmon, (MIACALCIN/FORTICAL) 200 UNIT/ACT nasal  spray USE 1 SPRAY IN THE NOSE DAILY AS DIRECTED 11.1 mL 1  . calcium carbonate (OS-CAL) 600 MG TABS Take 1,200 mg by mouth daily.    [name] ezetimibe (ZETIA) 10 MG tablet Take 1 tablet (10 mg total) by mouth daily. 90 tablet 1  . Multiple Vitamin (MULTIVITAMIN) tablet Take 1 tablet by mouth daily.      . calcitonin, salmon, (MIACALCIN/FORTICAL) 200 UNIT/ACT nasal spray USE 1 SPRAY IN THE NOSE DAILY AS DIRECTED 3.7 mL 1  . calcitonin, salmon, (MIACALCIN/FORTICAL) 200 UNIT/ACT nasal spray USE 1 SPRAY IN THE NOSE DAILY AS DIRECTED 11.1 mL 2  . ZETIA 10 MG tablet TAKE 1 TABLET BY  MOUTH EVERY DAY 90 tablet 0   No facility-administered medications prior to visit.    Review of Systems   Patient denies headache, fevers, malaise, unintentional weight loss, skin rash, eye pain, sinus congestion and sinus pain, sore throat, dysphagia,  hemoptysis , cough, dyspnea, wheezing, chest pain, palpitations, orthopnea, edema, abdominal pain, nausea, melena, diarrhea, constipation, flank pain, dysuria, hematuria, urinary  Frequency, nocturia, numbness, tingling, seizures,  Focal weakness, Loss of consciousness,  Tremor, insomnia, depression, anxiety, and suicidal ideation.      Objective:  BP 132/70 mmHg  Pulse 75  Temp(Src) 97.8 F (36.6 C) (Oral)  Resp 12  Ht 5\' 1"  (1.549 m)  Wt 108 lb 12 oz (49.329 kg)  BMI 20.56 kg/m2  SpO2 97%  Physical Exam   General appearance: alert, cooperative and appears stated age Ears: normal TM's and external ear canals both ears Throat: lips, mucosa, and tongue normal; teeth and gums normal Neck: no adenopathy, no carotid bruit, supple, symmetrical, trachea midline and thyroid not enlarged, symmetric, no tenderness/mass/nodules Back: symmetric, no curvature. ROM normal. No CVA tenderness. Lungs: clear to auscultation bilaterally Heart: regular rate and rhythm, S1, S2 normal, no murmur, click, rub or gallop Abdomen: soft, non-tender; bowel sounds normal; no masses,  no  organomegaly Pulses: 2+ and symmetric Skin: Skin color, texture, turgor normal. No rashes or lesions Lymph nodes: Cervical, supraclavicular, and axillary nodes normal.     Assessment & Plan:   Problem List Items Addressed This Visit    Encounter for preventive health examination - Primary    Annual comprehensive preventive exam was done as well as an evaluation and management of chronic conditions .  During the course of the visit the patient was educated and counseled about appropriate screening and preventive services including :  diabetes screening, lipid analysis with projected  10 year  risk for CAD , nutrition counseling, breast, cervical and colorectal cancer screening, and recommended immunizations.  Printed recommendations for health maintenance screenings was given. She continues to defer screenings.          A total of 25 minutes of face to face time was spent with patient more than half of which was spent in counselling about the above mentioned conditions  and coordination of care    I have discontinued Ms. Latif's ZETIA. I am also having her start on famciclovir. Additionally, I am having her maintain her multivitamin, aspirin, calcium carbonate, calcitonin (salmon), and ezetimibe.  Meds ordered this encounter  Medications  . famciclovir (FAMVIR) 500 MG tablet    Sig: Take 1 tablet (500 mg total) by mouth 3 (three) times daily.    Dispense:  21 tablet    Refill:  0    Medications Discontinued During This Encounter  Medication Reason  . calcitonin, salmon, (MIACALCIN/FORTICAL) 200 UNIT/ACT nasal spray Error  . calcitonin, salmon, (MIACALCIN/FORTICAL) 200 UNIT/ACT nasal spray Duplicate  . ZETIA 10 MG tablet Duplicate    Follow-up: Return in about 6 months (around 03/24/2016).   [name], MD

## 2015-10-10 ENCOUNTER — Ambulatory Visit (INDEPENDENT_AMBULATORY_CARE_PROVIDER_SITE_OTHER): Payer: Medicare Other | Admitting: Cardiovascular Disease

## 2015-10-10 ENCOUNTER — Encounter: Payer: Self-pay | Admitting: Cardiovascular Disease

## 2015-10-10 VITALS — BP 122/60 | HR 59 | Ht 61.0 in | Wt 109.8 lb

## 2015-10-10 DIAGNOSIS — I080 Rheumatic disorders of both mitral and aortic valves: Secondary | ICD-10-CM

## 2015-10-10 DIAGNOSIS — E785 Hyperlipidemia, unspecified: Secondary | ICD-10-CM | POA: Diagnosis not present

## 2015-10-10 DIAGNOSIS — R600 Localized edema: Secondary | ICD-10-CM | POA: Diagnosis not present

## 2015-10-10 NOTE — Patient Instructions (Signed)
You are doing well. No medication changes were made.  Please call us if you have new issues that need to be addressed before your next appt.  Your physician wants you to follow-up in: 12 months.  You will receive a reminder letter in the mail two months in advance. If you don't receive a letter, please call our office to schedule the follow-up appointment. 

## 2015-10-10 NOTE — Assessment & Plan Note (Signed)
No significant edema on today's visit No further workup

## 2015-10-10 NOTE — Progress Notes (Signed)
Patient ID: Brandi [name], female    DOB: 08/15/1927, 82 y.o.   MRN: [id number]  HPI Comments: 82 year old woman with anterior leaflet MVP and  moderate MR, Hyperlipidemia who presents for routine followup of her mitral valve disease. She has Mild aortic valve stenosis  In follow-up, she is exercising, walks 1 mile per day, goes to exercise class 5 days per week Denies any significant shortness of breath or chest discomfort with exertion.  She lives at [facility] [place], [place] social supports. Minimal leg edema Tolerating zetia. Total cholesterol 169  EKG on today's visit shows normal sinus rhythm with rate 59 bpm, T-wave abnormality in lead 3 and aVF   Other past medical history  lost her husband. He had a long history of severe pulmonary hypertension, chronic diastolic CHF.  mother passed away at age 68.  father did pass away at age 55 from a large MI, others in her family have lived into their 66s.  2-D echocardiogram performed May 08, 2009 showed normal left ventricular systolic function. Bileaflet mitral valve prolapse with moderate mitral regurgitation.         Allergies  Allergen Reactions  . Codeine     REACTION: N \\T \ V  . Statins     REACTION: INTOLERANCE: muscle aches  . Sulfonamide Derivatives     REACTION: N \\T \ V    Outpatient Encounter Prescriptions as of 10/10/2015  Medication Sig  . aspirin 81 MG tablet Takes three times a week  . calcitonin, salmon, (MIACALCIN/FORTICAL) 200 UNIT/ACT nasal spray USE 1 SPRAY IN THE NOSE DAILY AS DIRECTED  . calcium carbonate (OS-CAL) 600 MG TABS Take 1,200 mg by mouth daily.  [name] ezetimibe (ZETIA) 10 MG tablet Take 1 tablet (10 mg total) by mouth daily.  . famciclovir (FAMVIR) 500 MG tablet Take 1 tablet (500 mg total) by mouth 3 (three) times daily.  . Multiple Vitamin (MULTIVITAMIN) tablet Take 1 tablet by mouth daily.     No facility-administered encounter medications on file as of 10/10/2015.    Past Medical  History  Diagnosis Date  . Hyperlipidemia   . Mitral regurgitation   . Mitral valve prolapse     Past Surgical History  Procedure Laterality Date  . Breast biopsy  1980    normal  . Hospitalized for endocarditis  1987  . Biopsy breast  1980    left ,  benign    Social History  reports that she has never smoked. She has never used smokeless tobacco. She reports that she drinks alcohol. She reports that she does not use illicit drugs.  Family History family history includes Cancer in her sister; Coronary artery disease in her father; Heart attack in her father; Heart disease in her brother.   Review of Systems  Constitutional: Negative.   Respiratory: Negative.   Cardiovascular: Negative.   Gastrointestinal: Negative.   Musculoskeletal: Negative.   Skin: Negative.   Neurological: Negative.   Hematological: Negative.   Psychiatric/Behavioral: Negative.   All other systems reviewed and are negative.  BP 122/60 mmHg  Pulse 59  Ht  (1.549 m)  Wt 109 lb 12 oz (49.782 kg)  BMI 20.75 kg/m2  Physical Exam  Constitutional: She is oriented to person, place, and time. She appears well-developed and well-nourished.  HENT:  Head: Normocephalic.  Nose: Nose normal.  Mouth/Throat: Oropharynx is clear and moist.  Eyes: Conjunctivae are normal. Pupils are equal, round, and reactive to light.  Neck: Normal range of motion.  Neck supple. No JVD present.  Cardiovascular: Normal rate, regular rhythm, S1 normal, S2 normal and intact distal pulses.  Frequent extrasystoles are present. Exam reveals no gallop and no friction rub.   Murmur heard.  Crescendo systolic murmur is present with a grade of 3/6  Pulmonary/Chest: Effort normal and breath sounds normal. No respiratory distress. She has no wheezes. She has no rales. She exhibits no tenderness.  Abdominal: Soft. Bowel sounds are normal. She exhibits no distension. There is no tenderness.  Musculoskeletal: Normal range of motion.  She exhibits no edema or tenderness.  Lymphadenopathy:    She has no cervical adenopathy.  Neurological: She is alert and oriented to person, place, and time. Coordination normal.  Skin: Skin is warm and dry. No rash noted. No erythema.  Psychiatric: She has a normal mood and affect. Her behavior is normal. Judgment and thought content normal.    Assessment and Plan  Nursing note and vitals reviewed.

## 2015-10-10 NOTE — Assessment & Plan Note (Signed)
Cholesterol is at goal on the current lipid regimen. No changes to the medications were made.  

## 2015-10-10 NOTE — Assessment & Plan Note (Signed)
No significant change in her clinical status concerning for worsening mitral valve regurgitation. Good exercise tolerance. No dramatic murmur on exam No further workup at this time

## 2015-12-17 DIAGNOSIS — H2513 Age-related nuclear cataract, bilateral: Secondary | ICD-10-CM | POA: Diagnosis not present

## 2016-01-10 ENCOUNTER — Other Ambulatory Visit: Payer: Self-pay | Admitting: Internal Medicine

## 2016-02-12 ENCOUNTER — Other Ambulatory Visit: Payer: Self-pay | Admitting: Internal Medicine

## 2016-02-13 DIAGNOSIS — H903 Sensorineural hearing loss, bilateral: Secondary | ICD-10-CM | POA: Diagnosis not present

## 2016-02-13 DIAGNOSIS — J301 Allergic rhinitis due to pollen: Secondary | ICD-10-CM | POA: Diagnosis not present

## 2016-02-13 DIAGNOSIS — H90A22 Sensorineural hearing loss, unilateral, left ear, with restricted hearing on the contralateral side: Secondary | ICD-10-CM | POA: Diagnosis not present

## 2016-03-25 ENCOUNTER — Encounter: Payer: BLUE CROSS/BLUE SHIELD | Admitting: Internal Medicine

## 2016-03-27 ENCOUNTER — Ambulatory Visit (INDEPENDENT_AMBULATORY_CARE_PROVIDER_SITE_OTHER): Payer: Medicare Other | Admitting: Internal Medicine

## 2016-03-27 VITALS — BP 116/60 | HR 60 | Temp 98.1°F | Resp 18 | Ht 60.0 in | Wt 108.5 lb

## 2016-03-27 DIAGNOSIS — I080 Rheumatic disorders of both mitral and aortic valves: Secondary | ICD-10-CM

## 2016-03-27 DIAGNOSIS — E785 Hyperlipidemia, unspecified: Secondary | ICD-10-CM | POA: Diagnosis not present

## 2016-03-27 DIAGNOSIS — M810 Age-related osteoporosis without current pathological fracture: Secondary | ICD-10-CM

## 2016-03-27 DIAGNOSIS — Z79899 Other long term (current) drug therapy: Secondary | ICD-10-CM

## 2016-03-27 NOTE — Progress Notes (Signed)
Subjective:  Patient ID: Brandi [name], female    DOB: [birth year]  Age: 82 y.o. MRN: [id number]  CC: The primary encounter diagnosis was Hyperlipidemia. Diagnoses of Long-term use of high-risk medication, MITRAL REGURGITATION, and Osteoporosis were also pertinent to this visit.  HPI Brandi [name] presents for follow up. She is Feeling well,  Widowed,  Her husban'ds children  [place]   Have all visited since Jan,  All 5   Appetite good, goes to [employer] for free wine  On Tuesdays. [employer] for spaghetti.   Has mild to moderate MR<  asympotmatic  Had a new hearing aid in right ear ear.  Brandi BastosSees Brandi [name],  ENT annually. For hearing test every July . [name]. 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   Last fall 2 years ago,  Fractured right elbow    Outpatient Medications Prior to Visit  Medication Sig Dispense Refill  . aspirin 81 MG tablet Takes three times a week    . calcitonin, salmon, (MIACALCIN/FORTICAL) 200 UNIT/ACT nasal spray USE 1 SPRAY IN THE NOSE DAILY AS DIRECTED 3.7 mL 1  . calcium carbonate (OS-CAL) 600 MG TABS Take 1,200 mg by mouth daily.    [name]. ezetimibe (ZETIA) 10 MG tablet Take 1 tablet by mouth  daily 90 tablet 2  . famciclovir (FAMVIR) 500 MG tablet Take 1 tablet (500 mg total) by mouth 3 (three) times daily. 21 tablet 0  . Multiple Vitamin (MULTIVITAMIN) tablet Take 1 tablet by mouth daily.       No facility-administered medications prior to visit.     Review of Systems;  Patient denies headache, fevers, malaise, unintentional weight loss, skin rash, eye pain, sinus congestion and sinus pain, sore throat, dysphagia,  hemoptysis , cough, dyspnea, wheezing, chest pain, palpitations, orthopnea, edema, abdominal pain, nausea, melena, diarrhea, constipation, flank pain, dysuria, hematuria, urinary  Frequency, nocturia, numbness, tingling, seizures,  Focal weakness, Loss of consciousness,  Tremor,  insomnia, depression, anxiety, and suicidal ideation.      Objective:  BP 116/60   Pulse 60   Temp 98.1 F (36.7 C)   Resp 18   Ht 5' (1.524 m)   Wt 108 lb 8 oz (49.2 kg)   SpO2 99%   BMI 21.19 kg/m   BP Readings from Last 3 Encounters:  03/27/16 116/60  10/10/15 122/60  09/25/15 132/70    Wt Readings from Last 3 Encounters:  03/27/16 108 lb 8 oz (49.2 kg)  10/10/15 109 lb 12 oz (49.8 kg)  09/25/15 108 lb 12 oz (49.3 kg)    General appearance: alert, cooperative and appears stated age Ears: normal TM's and external ear canals both ears Throat: lips, mucosa, and tongue normal; teeth and gums normal Neck: no adenopathy, no carotid bruit, supple, symmetrical, trachea midline and thyroid not enlarged, symmetric, no tenderness/mass/nodules Back: symmetric, no curvature. ROM normal. No CVA tenderness. Lungs: clear to auscultation bilaterally Heart: regular rate and rhythm, S1, S2 normal, no murmur, click, rub or gallop Abdomen: soft, non-tender; bowel sounds normal; no masses,  no organomegaly Pulses: 2+ and symmetric Skin: Skin color, texture, turgor normal. No rashes or lesions Lymph nodes: Cervical, supraclavicular, and axillary nodes normal.  No results found for: HGBA1C  Lab Results  Component Value Date   CREATININE 0.99 03/27/2016   CREATININE 0.81 08/23/2015   CREATININE 0.8 08/24/2014    Lab Results  Component Value  Date   WBC 6.1 08/23/2015   HGB 14.1 08/23/2015   HCT 43.0 08/23/2015   PLT 239.0 08/23/2015   GLUCOSE 85 03/27/2016   CHOL 167 03/27/2016   TRIG 65.0 03/27/2016   HDL 60.40 03/27/2016   LDLDIRECT 164.8 07/31/2011   LDLCALC 94 03/27/2016   ALT 22 03/27/2016   AST 35 03/27/2016   NA 140 03/27/2016   K 4.0 03/27/2016   CL 106 03/27/2016   CREATININE 0.99 03/27/2016   BUN 20 03/27/2016   CO2 30 03/27/2016   TSH 1.93 08/23/2015    Dg Wrist Complete Left  Result Date: 02/22/2015 CLINICAL DATA:  Pain.  Bruising. EXAM: LEFT WRIST -  COMPLETE 3+ VIEW COMPARISON:  None. FINDINGS: There is a minimally displaced fracture along the volar aspect of the distal left radius with extension into the radiocarpal joint space. Prominent degenerative changes noted at the base of the first metatarsal. IMPRESSION: Minimally displaced fracture along the volar aspect of the distal left radius with extension into the radiocarpal joint space. Electronically Signed   By: Maisie Fus  Register   On: 02/22/2015 09:51    Assessment & Plan:   Problem List Items Addressed This Visit    MITRAL REGURGITATION    She is asymptomatic, has a history of endocarditis and takes pre procedure prophylactic antibiotics .  ACE I has not been started due to low normal BP       Osteoporosis    Managed with calcitonin due to history of intolerance to bisphosphonates and SERMs.  History of elbow and wrist  fractures secondary to fall noted.  Discussed the current controversies surrounding the risks and benefits of calcium supplementation.  Encouraged her to increase dietary calcium through natural foods including almond/coconut milk        Hyperlipidemia - Primary   Relevant Orders   Lipid panel (Completed)    Other Visit Diagnoses    Long-term use of high-risk medication       Relevant Orders   Comprehensive metabolic panel (Completed)      I am having Brandi [name] maintain her multivitamin, aspirin, calcium carbonate, famciclovir, calcitonin (salmon), and ezetimibe.  No orders of the defined types were placed in this encounter.   There are no discontinued medications.  Follow-up: No Follow-up on file.   Brandi Shams, MD

## 2016-03-27 NOTE — Patient Instructions (Signed)
You are doing very well!!  I will see you in 6 months  

## 2016-03-28 LAB — COMPREHENSIVE METABOLIC PANEL
ALT: 22 U/L (ref 0–35)
AST: 35 U/L (ref 0–37)
Albumin: 3.7 g/dL (ref 3.5–5.2)
Alkaline Phosphatase: 74 U/L (ref 39–117)
BILIRUBIN TOTAL: 0.4 mg/dL (ref 0.2–1.2)
BUN: 20 mg/dL (ref 6–23)
CHLORIDE: 106 meq/L (ref 96–112)
CO2: 30 meq/L (ref 19–32)
CREATININE: 0.99 mg/dL (ref 0.40–1.20)
Calcium: 9.4 mg/dL (ref 8.4–10.5)
GFR: 56.44 mL/min — ABNORMAL LOW (ref >60.00)
GLUCOSE: 85 mg/dL (ref 70–99)
Potassium: 4 mEq/L (ref 3.5–5.1)
SODIUM: 140 meq/L (ref 135–145)
Total Protein: 6.6 g/dL (ref 6.0–8.3)

## 2016-03-28 LAB — LIPID PANEL
CHOLESTEROL: 167 mg/dL (ref 0–200)
HDL: 60.4 mg/dL (ref >39.00)
LDL Cholesterol: 94 mg/dL (ref 0–99)
NonHDL: 106.53
TRIGLYCERIDES: 65 mg/dL (ref 0.0–149.0)
Total CHOL/HDL Ratio: 3
VLDL: 13 mg/dL (ref 0.0–40.0)

## 2016-03-29 ENCOUNTER — Encounter: Payer: Self-pay | Admitting: Internal Medicine

## 2016-03-29 NOTE — Assessment & Plan Note (Signed)
Managed with calcitonin due to history of intolerance to bisphosphonates and SERMs.  History of elbow and wrist  fractures secondary to fall noted.  Discussed the current controversies surrounding the risks and benefits of calcium supplementation.  Encouraged her to increase dietary calcium through natural foods including almond/coconut milk

## 2016-03-29 NOTE — Assessment & Plan Note (Signed)
She is asymptomatic, has a history of endocarditis and takes pre procedure prophylactic antibiotics .  ACE I has not been started due to low normal BP

## 2016-04-14 NOTE — Telephone Encounter (Signed)
Mailed unread message to patient.  

## 2016-06-12 DIAGNOSIS — Z23 Encounter for immunization: Secondary | ICD-10-CM | POA: Diagnosis not present

## 2016-07-15 ENCOUNTER — Telehealth: Payer: Self-pay | Admitting: Family

## 2016-07-15 ENCOUNTER — Ambulatory Visit (INDEPENDENT_AMBULATORY_CARE_PROVIDER_SITE_OTHER): Payer: Medicare Other | Admitting: Family

## 2016-07-15 ENCOUNTER — Encounter: Payer: Self-pay | Admitting: Family

## 2016-07-15 ENCOUNTER — Ambulatory Visit
Admission: RE | Admit: 2016-07-15 | Discharge: 2016-07-15 | Disposition: A | Discharge status: Home / Self Care | Payer: Medicare Other | Source: Ambulatory Visit | Attending: Family | Admitting: Family

## 2016-07-15 VITALS — BP 128/64 | HR 72 | Temp 97.8°F | Ht 60.0 in | Wt 109.6 lb

## 2016-07-15 DIAGNOSIS — M7989 Other specified soft tissue disorders: Secondary | ICD-10-CM | POA: Diagnosis not present

## 2016-07-15 DIAGNOSIS — M7121 Synovial cyst of popliteal space [Baker], right knee: Secondary | ICD-10-CM | POA: Insufficient documentation

## 2016-07-15 NOTE — Patient Instructions (Addendum)
   US of right leg.   Medical compression hose; if this fails, we can consider lasix as needed.   Continue to elevation of legs, low salt diet.   If there is no improvement in your symptoms, or if there is any worsening of symptoms, or if you have any additional concerns, please return for re-evaluation; or, if we are closed, consider going to the Emergency Room for evaluation if symptoms urgent.

## 2016-07-15 NOTE — Telephone Encounter (Signed)
Discussed result of US with patient; suspect baker's cyst rupture. We agreed on conservative therapy. Return precautions given

## 2016-07-15 NOTE — Progress Notes (Signed)
Pre visit review using our clinic review tool, if applicable. No additional management support is needed unless otherwise documented below in the visit note. 

## 2016-07-15 NOTE — Progress Notes (Signed)
Subjective:    Patient ID: Brandi [name], female    DOB: 1927-08-05, 82 y.o.   MRN: [id number]  CC: Brandi [name] is a 82 y.o. female who presents today for an acute visit.    HPI: Here for right leg swelling for past 3 weeks, unchanged. Tried ace wrap for 'pulled musle' posterior right leg which helped muscle pain however suspect ACE wrap 'made the swelling'. Has tried elevation of right leg with only minimal relief.   'Always has LE swelling forever', worse on right side. Not wearing compression hose. Has never taken lasix.   Denies exertional chest pain or pressure, numbness or tingling radiating to left arm or jaw, palpitations, dizziness, frequent headaches, changes in vision, or shortness of breath.   No h/o DVT.         HISTORY:  Past Medical History:  Diagnosis Date  . Hyperlipidemia   . Mitral regurgitation   . Mitral valve prolapse    Past Surgical History:  Procedure Laterality Date  . BIOPSY BREAST  1980   left ,  benign  . BREAST BIOPSY  1980   normal  . Hospitalized for endocarditis  1987   Family History  Problem Relation Age of Onset  . Heart attack Father   . Coronary artery disease Father   . Cancer Sister     esophageal  . Heart disease Brother     Allergies: Codeine; Statins; and Sulfonamide derivatives Current Outpatient Prescriptions on File Prior to Visit  Medication Sig Dispense Refill  . aspirin 81 MG tablet Takes three times a week    . calcitonin, salmon, (MIACALCIN/FORTICAL) 200 UNIT/ACT nasal spray USE 1 SPRAY IN THE NOSE DAILY AS DIRECTED 3.7 mL 1  . calcium carbonate (OS-CAL) 600 MG TABS Take 1,200 mg by mouth daily.    [name]. ezetimibe (ZETIA) 10 MG tablet Take 1 tablet by mouth  daily 90 tablet 2  . famciclovir (FAMVIR) 500 MG tablet Take 1 tablet (500 mg total) by mouth 3 (three) times daily. 21 tablet 0  . Multiple Vitamin (MULTIVITAMIN) tablet Take 1 tablet by mouth daily.       No current facility-administered medications on file  prior to visit.     Social History  Substance Use Topics  . Smoking status: Never Smoker  . Smokeless tobacco: Never Used  . Alcohol use Yes     Comment: 1 drink daily, cocktail    Review of Systems  Constitutional: Negative for chills and fever.  Respiratory: Negative for cough.   Cardiovascular: Positive for leg swelling. Negative for chest pain and palpitations.  Gastrointestinal: Negative for nausea and vomiting.      Objective:    BP 128/64   Pulse 72   Temp 97.8 F (36.6 C) (Oral)   Ht 5' (1.524 m)   Wt 109 lb 9.6 oz (49.7 kg)   SpO2 98%   BMI 21.40 kg/m    Physical Exam  Constitutional: She appears well-developed and well-nourished.  Eyes: Conjunctivae are normal.  Cardiovascular: Normal rate, regular rhythm, normal heart sounds and normal pulses.   + RLE edema. No LLE edema. No palpable cords or masses. No erythema or increased warmth. No asymmetry in calf size when compared bilaterally LE hair growth symmetric and present.  LE warm and palpable pedal pulses.   Pulmonary/Chest: Effort normal and breath sounds normal. She has no wheezes. She has no rhonchi. She has no rales.  Neurological: She is alert.  Skin: Skin is warm  and dry.  Psychiatric: She has a normal mood and affect. Her speech is normal and behavior is normal. Thought content normal.  Vitals reviewed.      Assessment & Plan:  1. Right leg swelling Pending right lower extremity ultrasound to ensure no DVT. Working diagnosis of chronic venous insufficiency. Patient is not wearing compression hose, and advised her to go to a medical supply store to get these. We jointly agreed on conservative therapy at this time and delayed use of any diuretics. Patient will follow-up if swelling does not improve with conservative therapy.  - US Venous Img Lower Unilateral Right     I am having Ms. Allocca maintain her multivitamin, aspirin, calcium carbonate, famciclovir, calcitonin (salmon), and  ezetimibe.   No orders of the defined types were placed in this encounter.   Return precautions given.   Risks, benefits, and alternatives of the medications and treatment plan prescribed today were discussed, and patient expressed understanding.   Education regarding symptom management and diagnosis given to patient on AVS.  Continue to follow with TULLO, Mar DaringERESA L, MD for routine health maintenance.   Lillia DallasJean M Mitrano and I agreed with plan.   Rennie PlowmanMargaret Lamari Beckles, FNP

## 2016-09-05 ENCOUNTER — Ambulatory Visit: Payer: BLUE CROSS/BLUE SHIELD

## 2016-09-05 ENCOUNTER — Ambulatory Visit (INDEPENDENT_AMBULATORY_CARE_PROVIDER_SITE_OTHER): Payer: Medicare Other

## 2016-09-05 VITALS — BP 120/64 | HR 73 | Temp 98.0°F | Resp 12 | Ht 59.0 in | Wt 111.4 lb

## 2016-09-05 DIAGNOSIS — Z0000 Encounter for general adult medical examination without abnormal findings: Secondary | ICD-10-CM | POA: Diagnosis not present

## 2016-09-05 NOTE — Progress Notes (Signed)
Subjective:   Brandi [name] is a 83 y.o. female who presents for Medicare Annual (Subsequent) preventive examination.  Review of Systems:  No ROS.  Medicare Wellness Visit.  Cardiac Risk Factors include: advanced age (>9655men, 38>65 women)     Objective:     Vitals: BP 120/64 (BP Location: Right Arm, Patient Position: Sitting, Cuff Size: Normal)   Pulse 73   Temp 98 F (36.7 C) (Oral)   Resp 12   Ht 4\' 11"  (1.499 m)   Wt 111 lb 6.4 oz (50.5 kg)   SpO2 98%   BMI 22.50 kg/m   Body mass index is 22.5 kg/m.   Tobacco History  Smoking Status  . Never Smoker  Smokeless Tobacco  . Never Used     Counseling given: Not Answered   Past Medical History:  Diagnosis Date  . Hyperlipidemia   . Mitral regurgitation   . Mitral valve prolapse    Past Surgical History:  Procedure Laterality Date  . BIOPSY BREAST  1980   left ,  benign  . BREAST BIOPSY  1980   normal  . Hospitalized for endocarditis  1987   Family History  Problem Relation Age of Onset  . Heart attack Father   . Coronary artery disease Father   . Cancer Sister     esophageal  . Heart disease Brother    History  Sexual Activity  . Sexual activity: Not Currently    Outpatient Encounter Prescriptions as of 09/05/2016  Medication Sig  . aspirin 81 MG tablet Takes three times a week  . calcitonin, salmon, (MIACALCIN/FORTICAL) 200 UNIT/ACT nasal spray USE 1 SPRAY IN THE NOSE DAILY AS DIRECTED  . calcium carbonate (OS-CAL) 600 MG TABS Take 1,200 mg by mouth daily.  [name]. ezetimibe (ZETIA) 10 MG tablet Take 1 tablet by mouth  daily  . Multiple Vitamin (MULTIVITAMIN) tablet Take 1 tablet by mouth daily.    . famciclovir (FAMVIR) 500 MG tablet Take 1 tablet (500 mg total) by mouth 3 (three) times daily. (Patient not taking: Reported on 09/05/2016)   No facility-administered encounter medications on file as of 09/05/2016.     Activities of Daily Living In your present state of health, do you have any difficulty  performing the following activities: 09/05/2016 09/06/2015  Hearing? Malvin JohnsY Y  Vision? N N  Difficulty concentrating or making decisions? N N  Walking or climbing stairs? N N  Dressing or bathing? N N  Doing errands, shopping? N N  Preparing Food and eating ? N N  Using the Toilet? N N  In the past six months, have you accidently leaked urine? N N  Do you have problems with loss of bowel control? N N  Managing your Medications? N N  Managing your Finances? N N  Housekeeping or managing your Housekeeping? N N  Some recent data might be hidden    Patient Care Team: Sherlene Shamseresa L Tullo, MD as PCP - General (Internal Medicine) Antonieta Ibaimothy J Gollan, MD as Consulting Physician (Cardiology)    Assessment:    This is a routine wellness examination for Brandi [name]. The goal of the wellness visit is to assist the patient how to close the gaps in care and create a preventative care plan for the patient.   Taking calcium as appropriate/Osteoporosis reviewed.  Medications reviewed; taking without issues or barriers.  Safety issues reviewed; smoke detectors in the home. No firearms in the home. Wears seatbelts when driving or riding with others. No violence in  the home.  No identified risk were noted; The patient was oriented x 3; appropriate in dress and manner and no objective failures at ADL's or IADL's.   BMI; discussed the importance of a healthy diet, water intake and exercise. Educational material provided.  Patient Concerns: None at this time. Follow up with PCP as needed.  Exercise Activities and Dietary recommendations Current Exercise Habits: Home exercise routine;Structured exercise class (Stab class 2 times weekly.  Wellness class 3 times a week.), Type of exercise: walking, Time (Minutes): 60, Frequency (Times/Week): 7, Weekly Exercise (Minutes/Week): 420, Intensity: Moderate  Goals    . Increase water intake          Stay hydrated and drink plenty of water/fluids       Fall  Risk Fall Risk  09/05/2016 07/15/2016 09/06/2015 01/31/2014 01/30/2014  Falls in the past year? No No No No No   Depression Screen PHQ 2/9 Scores 09/05/2016 07/15/2016 09/06/2015 01/31/2014  PHQ - 2 Score 0 0 0 0     Cognitive Function MMSE - Mini Mental State Exam 09/05/2016 09/06/2015  Orientation to time 5 5  Orientation to Place 5 5  Registration 3 3  Attention/ Calculation 5 5  Recall 3 3  Language- name 2 objects 2 2  Language- repeat 1 1  Language- follow 3 step command 3 3  Language- read & follow direction 1 1  Write a sentence 1 1  Copy design 1 1  Total score 30 30        Immunization History  Administered Date(s) Administered  . H1N1 10/12/2008  . Influenza Split 06/17/2011  . Influenza Whole 06/18/2008, 06/18/2009, 05/18/2010, 06/17/2010  . Influenza-Unspecified 05/18/2013, 05/23/2014, 06/18/2015  . Pneumococcal Conjugate-13 08/21/2014  . Pneumococcal Polysaccharide-23 08/18/1993, 07/28/2013  . Td 08/18/2005  . Tdap 12/27/2010  . Zoster 08/02/2010   Screening Tests Health Maintenance  Topic Date Due  . HEMOGLOBIN A1C  02/08/1929  . URINE MICROALBUMIN  08/12/1939  . FOOT EXAM  08/20/2016  . OPHTHALMOLOGY EXAM  12/17/2016  . TETANUS/TDAP  12/26/2020  . INFLUENZA VACCINE  Addressed  . DEXA SCAN  Completed  . ZOSTAVAX  Completed  . PNA vac Low Risk Adult  Completed      Plan:    End of life planning; Advance aging; Advanced directives discussed. Copy of current HCPOA/Living Will requested.  Medicare Attestation I have personally reviewed: The patient's medical and social history Their use of alcohol, tobacco or illicit drugs Their current medications and supplements The patient's functional ability including ADLs,fall risks, home safety risks, cognitive, and hearing and visual impairment Diet and physical activities Evidence for depression   The patient's weight, height, BMI, and visual acuity have been recorded in the chart.  I have made referrals  and provided education to the patient based on review of the above and I have provided the patient with a written personalized care plan for preventive services.     During the course of the visit the patient was educated and counseled about the following appropriate screening and preventive services:   Vaccines to include Pneumoccal, Influenza, Hepatitis B, Td, Zostavax, HCV  Electrocardiogram  Cardiovascular Disease  Colorectal cancer screening  Bone density screening  Diabetes screening  Glaucoma screening  Mammography/PAP  Nutrition counseling   Patient Instructions (the written plan) was given to the patient.   Ashok Pall, LPN  11/24/8117

## 2016-09-05 NOTE — Patient Instructions (Addendum)
  Ms. Brandi [name] , Thank you for taking time to come for your Medicare Wellness Visit. I appreciate your ongoing commitment to your health goals. Please review the following plan we discussed and let me know if I can assist you in the future.   Follow up with Dr. Darrick Huntsmanullo as needed.  These are the goals we discussed: Goals    . Increase water intake          Stay hydrated and drink plenty of water/fluids        This is a list of the screening recommended for you and due dates:  Health Maintenance  Topic Date Due  . Hemoglobin A1C  01/22/1929  . Urine Protein Check  08/12/1939  . Complete foot exam   08/20/2016  . Eye exam for diabetics  12/17/2016  . Tetanus Vaccine  12/26/2020  . Flu Shot  Addressed  . DEXA scan (bone density measurement)  Completed  . Shingles Vaccine  Completed  . Pneumonia vaccines  Completed

## 2016-09-09 NOTE — Progress Notes (Signed)
  I have reviewed the above information and agree with above.   Jaquavian Firkus, MD 

## 2016-09-29 ENCOUNTER — Ambulatory Visit: Payer: BLUE CROSS/BLUE SHIELD | Admitting: Internal Medicine

## 2016-10-09 ENCOUNTER — Ambulatory Visit (INDEPENDENT_AMBULATORY_CARE_PROVIDER_SITE_OTHER): Payer: Medicare Other | Admitting: Cardiovascular Disease

## 2016-10-09 ENCOUNTER — Encounter: Payer: Self-pay | Admitting: Cardiovascular Disease

## 2016-10-09 VITALS — BP 134/60 | HR 65 | Ht 60.0 in | Wt 110.8 lb

## 2016-10-09 DIAGNOSIS — I340 Nonrheumatic mitral (valve) insufficiency: Secondary | ICD-10-CM | POA: Diagnosis not present

## 2016-10-09 DIAGNOSIS — R600 Localized edema: Secondary | ICD-10-CM

## 2016-10-09 DIAGNOSIS — E785 Hyperlipidemia, unspecified: Secondary | ICD-10-CM

## 2016-10-09 NOTE — Progress Notes (Signed)
Cardiology Office Note  Date:  10/09/2016   ID:  Brandi [name] M Dapper, DOB 1927/01/05, MRN [id number]  PCP:  [name], [name], MD   Chief Complaint  Patient presents with  . other    12 mo f/u. Patient is "doing well" Meds verbally reviewed with patient.    HPI:  83 year old woman with anterior leaflet MVP and  moderate MR, Hyperlipidemia who presents for routine followup of her mitral valve disease. She has Mild aortic valve stenosis  In follow-up today she reports that she is feeling well Wears compression hose, leg swelling better walks 1 mile per day, goes to exercise class several days per week Denies any significant shortness of breath or chest discomfort with exertion.  [place]. Tolerating zetia.  Lab work reviewed with her in detail Total cholesterol 167  EKG on today's visit shows normal sinus rhythm with rate 65 bpm, T-wave abnormality in lead 3 and aVF   Other past medical history  lost her husband. He had a long history of severe pulmonary hypertension, chronic diastolic CHF.  mother passed away at age 81.  father did pass away at age 81 from a large MI, others in her family have lived into their 4390s.  2-D echocardiogram performed May 08, 2009 showed normal left ventricular systolic function. Bileaflet mitral valve prolapse with moderate mitral regurgitation.      PMH:   has a past medical history of Hyperlipidemia; Mitral regurgitation; and Mitral valve prolapse.  PSH:    Past Surgical History:  Procedure Laterality Date  . BIOPSY BREAST  1980   left ,  benign  . BREAST BIOPSY  1980   normal  . Hospitalized for endocarditis  1987    Current Outpatient Prescriptions  Medication Sig Dispense Refill  . aspirin 81 MG tablet Takes three times a week    . calcitonin, salmon, (MIACALCIN/FORTICAL) 200 UNIT/ACT nasal spray USE 1 SPRAY IN THE NOSE DAILY AS DIRECTED 3.7 mL 1  . calcium carbonate (OS-CAL) 600 MG TABS  Take 1,200 mg by mouth daily.    [name]. ezetimibe (ZETIA) 10 MG tablet Take 1 tablet by mouth  daily 90 tablet 2  . famciclovir (FAMVIR) 500 MG tablet Take 1 tablet (500 mg total) by mouth 3 (three) times daily. 21 tablet 0  . Multiple Vitamin (MULTIVITAMIN) tablet Take 1 tablet by mouth daily.       No current facility-administered medications for this visit.      Allergies:   Codeine; Statins; and Sulfonamide derivatives   Social History:  The patient  reports that she has never smoked. She has never used smokeless tobacco. She reports that she does not drink alcohol or use drugs.   Family History:   family history includes Cancer in her sister; Coronary artery disease in her father; Heart attack in her father; Heart disease in her brother.    Review of Systems: Review of Systems  Constitutional: Negative.   Respiratory: Negative.   Cardiovascular: Positive for leg swelling.  Gastrointestinal: Negative.   Musculoskeletal: Negative.   Neurological: Negative.   Psychiatric/Behavioral: Negative.   All other systems reviewed and are negative.    PHYSICAL EXAM: VS:  BP 134/60 (BP Location: Left Arm, Patient Position: Sitting, Cuff Size: Normal)   Pulse 65   Ht 5' (1.524 m)   Wt 110 lb 12 oz (50.2 kg)   BMI 21.63 kg/m  , BMI Body mass index is 21.63 kg/m. GEN: Well  nourished, well developed, in no acute distress  HEENT: normal  Neck: no JVD, carotid bruits, or masses Cardiac: RRR; no murmurs, rubs, or gallops,no edema  Respiratory:  clear to auscultation bilaterally, normal work of breathing GI: soft, nontender, nondistended, + BS MS: no deformity or atrophy , ted hose in place Skin: warm and dry, no rash Neuro:  Strength and sensation are intact Psych: euthymic mood, full affect    Recent Labs: 03/27/2016: ALT 22; BUN 20; Creatinine, Ser 0.99; Potassium 4.0; Sodium 140    Lipid Panel Lab Results  Component Value Date   CHOL 167 03/27/2016   HDL 60.40 03/27/2016    LDLCALC 94 03/27/2016   TRIG 65.0 03/27/2016      Wt Readings from Last 3 Encounters:  10/09/16 110 lb 12 oz (50.2 kg)  09/05/16 111 lb 6.4 oz (50.5 kg)  07/15/16 109 lb 9.6 oz (49.7 kg)       ASSESSMENT AND PLAN:  Hyperlipidemia, unspecified hyperlipidemia type -  Cholesterol is at goal on the current lipid regimen. No changes to the medications were made.  Mitral valve insufficiency, unspecified etiology -  Echocardiogram has been ordered for mitral valve disease, murmur, leg edema Last echocardiogram 2012 at that time with moderate MR  Localized edema - Plan: ECHOCARDIOGRAM COMPLETE Wearing compression hose recently with improvement of her leg swelling Likely component of venous insufficiency  echocardiogram pending   Total encounter time more than 15 minutes  Greater than 50% was spent in counseling and coordination of care with the patient  Disposition:   F/U  12 months   Orders Placed This Encounter  Procedures  . EKG 12-Lead  . ECHOCARDIOGRAM COMPLETE     Signed, Dossie Arbour, M.D., Ph.D. 10/09/2016  [facility] [place], [place] [phone]

## 2016-10-09 NOTE — Patient Instructions (Addendum)
Medication Instructions:   No medication changes made  Labwork:  No new labs needed  Testing/Procedures:  We will schedule an echocardiogram for mitral valve regurgitation, murmur Echocardiography is a painless test that uses sound waves to create images of your heart. It provides your doctor with information about the size and shape of your heart and how well your heart's chambers and valves are working. This procedure takes approximately one hour. There are no restrictions for this procedure.   I recommend watching educational videos on topics of interest to you at:       [email or web address]  Enter code: HEARTCARE    Follow-Up: It was a pleasure seeing you in the office today. Please call us if you have new issues that need to be addressed before your next appt.  [phone]  Your physician wants you to follow-up in: 12 months.  You will receive a reminder letter in the mail two months in advance. If you don't receive a letter, please call our office to schedule the follow-up appointment.  If you need a refill on your cardiac medications before your next appointment, please call your pharmacy.    Echocardiogram An echocardiogram, or echocardiography, uses sound waves (ultrasound) to produce an image of your heart. The echocardiogram is simple, painless, obtained within a short period of time, and offers valuable information to your health care provider. The images from an echocardiogram can provide information such as:  Evidence of coronary artery disease (CAD).  Heart size.  Heart muscle function.  Heart valve function.  Aneurysm detection.  Evidence of a past heart attack.  Fluid buildup around the heart.  Heart muscle thickening.  Assess heart valve function. Tell a health care provider about:  Any allergies you have.  All medicines you are taking, including vitamins, herbs, eye drops, creams, and over-the-counter medicines.  Any problems you or family  members have had with anesthetic medicines.  Any blood disorders you have.  Any surgeries you have had.  Any medical conditions you have.  Whether you are pregnant or may be pregnant. What happens before the procedure? No special preparation is needed. Eat and drink normally. What happens during the procedure?  In order to produce an image of your heart, gel will be applied to your chest and a wand-like tool (transducer) will be moved over your chest. The gel will help transmit the sound waves from the transducer. The sound waves will harmlessly bounce off your heart to allow the heart images to be captured in real-time motion. These images will then be recorded.  You may need an IV to receive a medicine that improves the quality of the pictures. What happens after the procedure? You may return to your normal schedule including diet, activities, and medicines, unless your health care provider tells you otherwise. This information is not intended to replace advice given to you by your health care provider. Make sure you discuss any questions you have with your health care provider. Document Released: 08/01/2000 Document Revised: 03/22/2016 Document Reviewed: 04/11/2013 Elsevier Interactive Patient Education  2017 [employer]

## 2016-10-23 ENCOUNTER — Other Ambulatory Visit: Payer: Self-pay

## 2016-10-23 ENCOUNTER — Ambulatory Visit (INDEPENDENT_AMBULATORY_CARE_PROVIDER_SITE_OTHER): Payer: Medicare Other

## 2016-10-23 DIAGNOSIS — E785 Hyperlipidemia, unspecified: Secondary | ICD-10-CM

## 2016-10-23 DIAGNOSIS — R600 Localized edema: Secondary | ICD-10-CM

## 2016-10-23 DIAGNOSIS — I340 Nonrheumatic mitral (valve) insufficiency: Secondary | ICD-10-CM

## 2016-10-31 ENCOUNTER — Ambulatory Visit (INDEPENDENT_AMBULATORY_CARE_PROVIDER_SITE_OTHER): Payer: Medicare Other | Admitting: Internal Medicine

## 2016-10-31 ENCOUNTER — Encounter: Payer: Self-pay | Admitting: Internal Medicine

## 2016-10-31 VITALS — BP 132/60 | HR 63 | Resp 16 | Ht 59.0 in | Wt 112.8 lb

## 2016-10-31 DIAGNOSIS — R600 Localized edema: Secondary | ICD-10-CM | POA: Diagnosis not present

## 2016-10-31 DIAGNOSIS — Z79899 Other long term (current) drug therapy: Secondary | ICD-10-CM

## 2016-10-31 DIAGNOSIS — E7800 Pure hypercholesterolemia, unspecified: Secondary | ICD-10-CM | POA: Diagnosis not present

## 2016-10-31 DIAGNOSIS — R5383 Other fatigue: Secondary | ICD-10-CM | POA: Diagnosis not present

## 2016-10-31 LAB — COMPREHENSIVE METABOLIC PANEL
ALBUMIN: 3.8 g/dL (ref 3.5–5.2)
ALK PHOS: 73 U/L (ref 39–117)
ALT: 20 U/L (ref 0–35)
AST: 33 U/L (ref 0–37)
BILIRUBIN TOTAL: 0.4 mg/dL (ref 0.2–1.2)
BUN: 26 mg/dL — AB (ref 6–23)
CO2: 31 mEq/L (ref 19–32)
Calcium: 10 mg/dL (ref 8.4–10.5)
Chloride: 102 mEq/L (ref 96–112)
Creatinine, Ser: 0.86 mg/dL (ref 0.40–1.20)
GFR: 66.31 mL/min (ref >60.00)
Glucose, Bld: 99 mg/dL (ref 70–99)
POTASSIUM: 4.4 meq/L (ref 3.5–5.1)
SODIUM: 136 meq/L (ref 135–145)
TOTAL PROTEIN: 6.6 g/dL (ref 6.0–8.3)

## 2016-10-31 LAB — TSH: TSH: 1.11 u[IU]/mL (ref 0.35–4.50)

## 2016-10-31 LAB — LDL CHOLESTEROL, DIRECT: Direct LDL: 91 mg/dL

## 2016-10-31 MED ORDER — POTASSIUM CHLORIDE CRYS ER 10 MEQ PO TBCR: 10.0000 meq | EXTENDED_RELEASE_TABLET | Freq: Every day | ORAL | 1 refills | Status: DC

## 2016-10-31 MED ORDER — FUROSEMIDE 20 MG PO TABS: 20.0000 mg | ORAL_TABLET | Freq: Every day | ORAL | 3 refills | Status: DC

## 2016-10-31 NOTE — Patient Instructions (Addendum)
[email or web address] has a great selection of compression stockings and knee highs     If your weight changes by 2 lbs or more overnight,  Take the furosemide and potassium tablet once daily until your weight returns to baseline   Same directions if the weight gain is 5 lbs in one week

## 2016-10-31 NOTE — Progress Notes (Signed)
Subjective:  Patient ID: Brandi [name], female    DOB: 1927/02/27  Age: 83 y.o. MRN: [id number]  CC: The primary encounter diagnosis was Long-term use of high-risk medication. Diagnoses of Pure hypercholesterolemia, Fatigue, unspecified type, and Localized edema were also pertinent to this visit.  HPI Brandi [name] Brandi [name] presents for follow up on hyperlipidemia managed with zetia,  Osteoporosis with prior fractures managed with miacalcin due to multiple drug intolerances   Started having right knee pain and tightness, aggravated by walking her daily mile on the track.  Sent for [place]  Had ultrasound of right knee showed a 5 x 2.5 x 1 cm  baker's cyst.,  No DVT .   Now wearing compression stockings for edema due to VI.   Has deferred starting lasix for elevated pulmonary  pressure s   Outpatient Medications Prior to Visit  Medication Sig Dispense Refill  . aspirin 81 MG tablet Takes three times a week    . calcitonin, salmon, (MIACALCIN/FORTICAL) 200 UNIT/ACT nasal spray USE 1 SPRAY IN THE NOSE DAILY AS DIRECTED 3.7 mL 1  . calcium carbonate (OS-CAL) 600 MG TABS Take 1,200 mg by mouth daily.    [name] ezetimibe (ZETIA) 10 MG tablet Take 1 tablet by mouth  daily 90 tablet 2  . famciclovir (FAMVIR) 500 MG tablet Take 1 tablet (500 mg total) by mouth 3 (three) times daily. 21 tablet 0  . Multiple Vitamin (MULTIVITAMIN) tablet Take 1 tablet by mouth daily.       No facility-administered medications prior to visit.     Review of Systems;  Patient denies headache, fevers, malaise, unintentional weight loss, skin rash, eye pain, sinus congestion and sinus pain, sore throat, dysphagia,  hemoptysis , cough, dyspnea, wheezing, chest pain, palpitations, orthopnea, edema, abdominal pain, nausea, melena, diarrhea, constipation, flank pain, dysuria, hematuria, urinary  Frequency, nocturia, numbness, tingling, seizures,  Focal weakness, Loss of consciousness,  Tremor, insomnia, depression, anxiety, and suicidal  ideation.      Objective:  BP 132/60 (BP Location: Left Arm, Patient Position: Sitting, Cuff Size: Normal)   Pulse 63   Resp 16   Ht 4\' 11"  (1.499 m)   Wt 112 lb 12.8 oz (51.2 kg)   SpO2 98%   BMI 22.78 kg/m   BP Readings from Last 3 Encounters:  10/31/16 132/60  10/09/16 134/60  09/05/16 120/64    Wt Readings from Last 3 Encounters:  10/31/16 112 lb 12.8 oz (51.2 kg)  10/09/16 110 lb 12 oz (50.2 kg)  09/05/16 111 lb 6.4 oz (50.5 kg)    General appearance: alert, cooperative and appears stated age Ears: normal TM's and external ear canals both ears Throat: lips, mucosa, and tongue normal; teeth and gums normal Neck: no adenopathy, no carotid bruit, supple, symmetrical, trachea midline and thyroid not enlarged, symmetric, no tenderness/mass/nodules Back: symmetric, no curvature. ROM normal. No CVA tenderness. Lungs: clear to auscultation bilaterally Heart: regular rate and rhythm, S1, S2 normal, no murmur, click, rub or gallop Abdomen: soft, non-tender; bowel sounds normal; no masses,  no organomegaly Pulses: 2+ and symmetric Skin: Skin color, texture, turgor normal. No rashes or lesions Lymph nodes: Cervical, supraclavicular, and axillary nodes normal.  No results found for: HGBA1C  Lab Results  Component Value Date   CREATININE 0.86 10/31/2016   CREATININE 0.99 03/27/2016   CREATININE 0.81 08/23/2015    Lab Results  Component Value Date   WBC 6.1 08/23/2015   HGB 14.1 08/23/2015   HCT 43.0 08/23/2015  PLT 239.0 08/23/2015   GLUCOSE 99 10/31/2016   CHOL 167 03/27/2016   TRIG 65.0 03/27/2016   HDL 60.40 03/27/2016   LDLDIRECT 91.0 10/31/2016   LDLCALC 94 03/27/2016   ALT 20 10/31/2016   AST 33 10/31/2016   NA 136 10/31/2016   K 4.4 10/31/2016   CL 102 10/31/2016   CREATININE 0.86 10/31/2016   BUN 26 (H) 10/31/2016   CO2 31 10/31/2016   TSH 1.11 10/31/2016    [place] Venous Img Lower Unilateral Right  Result Date: 07/15/2016 CLINICAL DATA:  Right  lower extremity swelling for 3 week EXAM: RIGHT LOWER EXTREMITY VENOUS DUPLEX ULTRASOUND TECHNIQUE: Doppler venous assessment of the right lower extremity deep venous [name] was performed, including characterization of spectral flow, compressibility, and phasicity. COMPARISON:  None. FINDINGS: There is complete compressibility of the right common femoral, femoral, and popliteal veins. Doppler analysis demonstrates respiratory phasicity and augmentation of flow with calf compression. No obvious superficial vein or calf vein thrombosis. 4.9 x 0.9 x 2.5 cm right popliteal fossa Baker's cyst. IMPRESSION: No evidence of right lower extremity DVT. Moderate sized right Baker's cyst. Electronically Signed   By: Jolaine ClickArthur  Hoss M.D.   On: 07/15/2016 14:44    Assessment & Plan:   Problem List Items Addressed This Visit    Edema    Multifactorial with V and right sided heart failure suggested by elevated pulmonary pressure . Has not started the daily lasix yet,  Reluctant,  Advised her to weigh daily and take lasix for gain of 12 lbs overnight       Hyperlipidemia    Managed with Zetia due to intolerance of other statins,  LFTS are normal    Lab Results  Component Value Date   CHOL 167 03/27/2016   HDL 60.40 03/27/2016   LDLCALC 94 03/27/2016   LDLDIRECT 91.0 10/31/2016   TRIG 65.0 03/27/2016   CHOLHDL 3 03/27/2016   Lab Results  Component Value Date   ALT 20 10/31/2016   AST 33 10/31/2016   ALKPHOS 73 10/31/2016   BILITOT 0.4 10/31/2016         Relevant Medications   furosemide (LASIX) 20 MG tablet   Other Relevant Orders   LDL cholesterol, direct (Completed)    Other Visit Diagnoses    Long-term use of high-risk medication    -  Primary   Relevant Orders   Comprehensive metabolic panel (Completed)   Fatigue, unspecified type       Relevant Orders   TSH (Completed)      I am having Ms. Brandi [name] start on furosemide and potassium chloride. I am also having her maintain her multivitamin,  aspirin, calcium carbonate, famciclovir, calcitonin (salmon), and ezetimibe.  Meds ordered this encounter  Medications  . furosemide (LASIX) 20 MG tablet    Sig: Take 1 tablet (20 mg total) by mouth daily. As needed for fluid retention    Dispense:  30 tablet    Refill:  3  . potassium chloride SA (K-DUR,KLOR-CON) 10 MEQ tablet    Sig: Take 1 tablet (10 mEq total) by mouth daily.    Dispense:  30 tablet    Refill:  1    There are no discontinued medications.  Follow-up: Return in about 6 months (around 05/03/2017).   [name], Delois Silvester L, MD

## 2016-10-31 NOTE — Progress Notes (Signed)
Pre visit review using our clinic review tool, if applicable. No additional management support is needed unless otherwise documented below in the visit note. 

## 2016-11-01 NOTE — Assessment & Plan Note (Signed)
Managed with Zetia due to intolerance of other statins,  LFTS are normal    Lab Results  Component Value Date   CHOL 167 03/27/2016   HDL 60.40 03/27/2016   LDLCALC 94 03/27/2016   LDLDIRECT 91.0 10/31/2016   TRIG 65.0 03/27/2016   CHOLHDL 3 03/27/2016   Lab Results  Component Value Date   ALT 20 10/31/2016   AST 33 10/31/2016   ALKPHOS 73 10/31/2016   BILITOT 0.4 10/31/2016

## 2016-11-01 NOTE — Assessment & Plan Note (Signed)
Multifactorial with V and right sided heart failure suggested by elevated pulmonary pressure . Has not started the daily lasix yet,  Reluctant,  Advised her to weigh daily and take lasix for gain of 12 lbs overnight

## 2016-11-02 ENCOUNTER — Encounter: Payer: Self-pay | Admitting: Internal Medicine

## 2016-11-04 ENCOUNTER — Telehealth: Payer: Self-pay | Admitting: Internal Medicine

## 2016-11-04 NOTE — Telephone Encounter (Signed)
Attempted to reach patient.  Left a voice mail message to call me back.

## 2016-11-04 NOTE — Telephone Encounter (Signed)
Pt two vm requesting to speak to Denisa. She did not state what it was requesting. Pt cb [phone]

## 2016-11-05 ENCOUNTER — Encounter: Payer: Self-pay | Admitting: Internal Medicine

## 2016-11-05 NOTE — Telephone Encounter (Signed)
Spoke with patient regarding a billing issue.  Will follow as appropriate.

## 2016-11-05 NOTE — Telephone Encounter (Signed)
lvm asking you to call her back.She stated that she could not understand what you said. Pt cb [phone]

## 2016-11-09 DIAGNOSIS — I341 Nonrheumatic mitral (valve) prolapse: Secondary | ICD-10-CM

## 2016-11-09 DIAGNOSIS — I350 Nonrheumatic aortic (valve) stenosis: Secondary | ICD-10-CM | POA: Insufficient documentation

## 2016-11-09 DIAGNOSIS — I272 Pulmonary hypertension, unspecified: Secondary | ICD-10-CM | POA: Insufficient documentation

## 2016-11-09 DIAGNOSIS — I340 Nonrheumatic mitral (valve) insufficiency: Secondary | ICD-10-CM | POA: Insufficient documentation

## 2016-11-09 NOTE — Progress Notes (Signed)
Cardiology Office Note  Date:  11/10/2016   ID:  Brandi [name] M Straughter, DOB March 12, 1927, MRN [id number]  PCP:  Brandi [name], [name], MD   Chief Complaint  Patient presents with  . other    Follow up from Echo. Meds reviewed by the pt. verbally. "doing well."     HPI:  83 year old woman with  anterior leaflet MVP and moderate MR,  Hyperlipidemia  who presents for routine followup of her mitral valve disease. Mild to moderate  aortic valve stenosis On recent echocardiogram  In follow-up today she reports that she, Walks one mile every morning Then goes to exercise class for one hour every day Just got  Lasix in the mail, with potassium, Has not tried it yet wears compression, Trace leg edema when she does not wear compression hose No abdominal fullness otherwise reports that she feels great No shortness of breath on exertion  has not noticed a change in her symptoms over the past several years  Echo 10/23/2016 Reviewed with her in detail, images pulled up in the office for her to review   The estimated ejection fraction was in the range of 60% to 65%. Aortic valve:  mild stenosis: - Mitral valve: Moderate myxomatous degeneration. Prolapse noted of anterior mitral valve leaflet. There was moderate to severe regurgitation. - Left atrium: The atrium was severely dilated. - Right atrium: The atrium was moderately dilated. - Tricuspid valve: There was moderate-severe regurgitation. - Pulmonary arteries: Systolic pressure was moderately increased.   PA peak pressure: 54 mm Hg (S).  In follow-up today she reports that she is feeling well Wears compression hose, leg swelling better walks 1 mile per day, goes to exercise class several days per week Denies any significant shortness of breath or chest discomfort with exertion.  [place] Tolerating zetia.  Lab work reviewed with her in detail Total cholesterol 167   Other past medical history lost  her husband. He had a long history of severe pulmonary hypertension, chronic diastolic CHF. mother passed away at age 81. father did pass away at age 81 from a large MI, others in her family have lived into their 4390s.  2-D echocardiogram May 08, 2009 showed normal left ventricular systolic function. Bileaflet mitral valve prolapse with moderate mitral regurgitation.    PMH:   has a past medical history of Hyperlipidemia; Mitral regurgitation; and Mitral valve prolapse.  PSH:    Past Surgical History:  Procedure Laterality Date  . BIOPSY BREAST  1980   left ,  benign  . BREAST BIOPSY  1980   normal  . Hospitalized for endocarditis  1987    Current Outpatient Prescriptions  Medication Sig Dispense Refill  . aspirin 81 MG tablet Takes three times a week    . calcitonin, salmon, (MIACALCIN/FORTICAL) 200 UNIT/ACT nasal spray USE 1 SPRAY IN THE NOSE DAILY AS DIRECTED 3.7 mL 1  . calcium carbonate (OS-CAL) 600 MG TABS Take 1,200 mg by mouth daily.    [name]. ezetimibe (ZETIA) 10 MG tablet Take 1 tablet by mouth  daily 90 tablet 2  . famciclovir (FAMVIR) 500 MG tablet Take 1 tablet (500 mg total) by mouth 3 (three) times daily. 21 tablet 0  . furosemide (LASIX) 20 MG tablet Take 1 tablet (20 mg total) by mouth daily. As needed for fluid retention 30 tablet 3  . Multiple Vitamin (MULTIVITAMIN) tablet Take 1 tablet by mouth daily.      . potassium chloride  SA (K-DUR,KLOR-CON) 10 MEQ tablet Take 1 tablet (10 mEq total) by mouth daily. 30 tablet 1   No current facility-administered medications for this visit.      Allergies:   Codeine; Statins; and Sulfonamide derivatives   Social History:  The patient  reports that she has never smoked. She has never used smokeless tobacco. She reports that she does not drink alcohol or use drugs.   Family History:   family history includes Cancer in her sister; Coronary artery disease in her father; Heart attack in her father; Heart disease in her  brother.    Review of Systems: Review of Systems  Constitutional: Negative.   Respiratory: Negative.   Cardiovascular: Negative.   Gastrointestinal: Negative.   Musculoskeletal: Negative.   Neurological: Negative.   Psychiatric/Behavioral: Negative.   All other systems reviewed and are negative.    PHYSICAL EXAM: VS:  BP (!) 142/62 (BP Location: Left Arm, Patient Position: Sitting, Cuff Size: Normal)   Pulse 65   Ht 4\' 11"  (1.499 m)   Wt 112 lb 8 oz (51 kg)   BMI 22.72 kg/m  , BMI Body mass index is 22.72 kg/m. GEN: Well nourished, well developed, in no acute distress  HEENT: normal  Neck: no JVD, carotid bruits, or masses Cardiac: RRR; 2/6 SEM RSB, LSB murmurs, rubs, or gallops,No significant leg edema , Compression hose in place Respiratory:  clear to auscultation bilaterally, normal work of breathing GI: soft, nontender, nondistended, + BS MS: no deformity or atrophy  Skin: warm and dry, no rash Neuro:  Strength and sensation are intact Psych: euthymic mood, full affect    Recent Labs: 10/31/2016: ALT 20; BUN 26; Creatinine, Ser 0.86; Potassium 4.4; Sodium 136; TSH 1.11    Lipid Panel Lab Results  Component Value Date   CHOL 167 03/27/2016   HDL 60.40 03/27/2016   LDLCALC 94 03/27/2016   TRIG 65.0 03/27/2016      Wt Readings from Last 3 Encounters:  11/10/16 112 lb 8 oz (51 kg)  10/31/16 112 lb 12.8 oz (51.2 kg)  10/09/16 110 lb 12 oz (50.2 kg)       ASSESSMENT AND PLAN:  Mixed hyperlipidemia Cholesterol is at goal on the current lipid regimen. No changes to the medications were made.  Chronic venous insufficiency Recommended compression hose  MITRAL REGURGITATION As detailed below, these to moderate regurg, eccentric Also with tricuspid valve regurg eccentric  Aortic valve stenosis, etiology of cardiac valve disease unspecified Mild aortic valve stenosis by velocity and mean gradient  MVP (mitral valve prolapse) Long history of prolapse,  at least moderate MR, Eccentric Asymptomatic, normal LV function  Pulmonary HTN Long discussion with her, normal LV andRV function Good exercise tolerance Recommended she start Lasix with potassium daily, amlodipine In follow-up visits could add revatio. As she feels well, she is declining further workup at this time Could consider right heart catheterization in the future  Disposition:   F/U  6 months   Total encounter time more than 25 minutes  Greater than 50% was spent in counseling and coordination of care with the patient   No orders of the defined types were placed in this encounter.    Signed, Dossie Arbour, M.D., Ph.D. 11/10/2016  [facility] [place], [place] [phone]

## 2016-11-10 ENCOUNTER — Ambulatory Visit (INDEPENDENT_AMBULATORY_CARE_PROVIDER_SITE_OTHER): Payer: Medicare Other | Admitting: Cardiovascular Disease

## 2016-11-10 ENCOUNTER — Encounter: Payer: Self-pay | Admitting: Cardiovascular Disease

## 2016-11-10 VITALS — BP 142/62 | HR 65 | Ht 59.0 in | Wt 112.5 lb

## 2016-11-10 DIAGNOSIS — I872 Venous insufficiency (chronic) (peripheral): Secondary | ICD-10-CM | POA: Diagnosis not present

## 2016-11-10 DIAGNOSIS — I272 Pulmonary hypertension, unspecified: Secondary | ICD-10-CM

## 2016-11-10 DIAGNOSIS — I350 Nonrheumatic aortic (valve) stenosis: Secondary | ICD-10-CM | POA: Diagnosis not present

## 2016-11-10 DIAGNOSIS — I080 Rheumatic disorders of both mitral and aortic valves: Secondary | ICD-10-CM | POA: Diagnosis not present

## 2016-11-10 DIAGNOSIS — I341 Nonrheumatic mitral (valve) prolapse: Secondary | ICD-10-CM | POA: Diagnosis not present

## 2016-11-10 DIAGNOSIS — E782 Mixed hyperlipidemia: Secondary | ICD-10-CM

## 2016-11-10 MED ORDER — AMLODIPINE BESYLATE 5 MG PO TABS: 5.0000 mg | ORAL_TABLET | Freq: Every day | ORAL | 3 refills | Status: DC

## 2016-11-10 NOTE — Patient Instructions (Addendum)
Medication Instructions:   Please take lasix daily with potassium daily  Start amlodipine daily for heart pressures  Labwork:  BMP in 6 weeks  Testing/Procedures:  No further testing at this time   Follow-Up: It was a pleasure seeing you in the office today. Please call us if you have new issues that need to be addressed before your next appt.  [phone]  Your physician wants you to follow-up in: 6 months.  You will receive a reminder letter in the mail two months in advance. If you don't receive a letter, please call our office to schedule the follow-up appointment.  If you need a refill on your cardiac medications before your next appointment, please call your pharmacy.

## 2016-11-17 NOTE — Telephone Encounter (Signed)
Mailed unread message to patient.  

## 2016-11-19 NOTE — Telephone Encounter (Signed)
Made patient aware of email response recently received from [place] in billing regarding her account.  Patient verbalized understanding that her insurance has an outstanding balance and at this time, no refund can be issued. Currently awaiting a response from Medicare.  Will continue to follow as appropriate.

## 2016-11-19 NOTE — Telephone Encounter (Signed)
Mailed unread message to patient. thanks 

## 2016-11-24 ENCOUNTER — Other Ambulatory Visit: Payer: Self-pay | Admitting: *Deleted

## 2016-11-24 MED ORDER — FUROSEMIDE 20 MG PO TABS: 20.0000 mg | ORAL_TABLET | Freq: Every day | ORAL | 3 refills | Status: DC

## 2016-11-24 MED ORDER — POTASSIUM CHLORIDE CRYS ER 10 MEQ PO TBCR: 10.0000 meq | EXTENDED_RELEASE_TABLET | Freq: Every day | ORAL | 3 refills | Status: DC

## 2016-11-28 ENCOUNTER — Other Ambulatory Visit: Payer: Self-pay | Admitting: Cardiovascular Disease

## 2016-11-28 MED ORDER — POTASSIUM CHLORIDE CRYS ER 10 MEQ PO TBCR: 10.0000 meq | EXTENDED_RELEASE_TABLET | Freq: Every day | ORAL | 1 refills | Status: DC

## 2016-12-19 DIAGNOSIS — H2511 Age-related nuclear cataract, right eye: Secondary | ICD-10-CM | POA: Diagnosis not present

## 2016-12-22 ENCOUNTER — Other Ambulatory Visit (INDEPENDENT_AMBULATORY_CARE_PROVIDER_SITE_OTHER): Payer: Medicare Other

## 2016-12-22 DIAGNOSIS — I872 Venous insufficiency (chronic) (peripheral): Secondary | ICD-10-CM | POA: Diagnosis not present

## 2016-12-22 DIAGNOSIS — E782 Mixed hyperlipidemia: Secondary | ICD-10-CM

## 2016-12-22 DIAGNOSIS — I341 Nonrheumatic mitral (valve) prolapse: Secondary | ICD-10-CM | POA: Diagnosis not present

## 2016-12-22 DIAGNOSIS — I350 Nonrheumatic aortic (valve) stenosis: Secondary | ICD-10-CM

## 2016-12-22 DIAGNOSIS — I272 Pulmonary hypertension, unspecified: Secondary | ICD-10-CM

## 2016-12-22 DIAGNOSIS — I080 Rheumatic disorders of both mitral and aortic valves: Secondary | ICD-10-CM

## 2016-12-23 LAB — BASIC METABOLIC PANEL
BUN / CREAT RATIO: 19 (ref 12–28)
BUN: 14 mg/dL (ref 8–27)
CHLORIDE: 103 mmol/L (ref 96–106)
CO2: 26 mmol/L (ref 18–29)
Calcium: 9.1 mg/dL (ref 8.7–10.3)
Creatinine, Ser: 0.75 mg/dL (ref 0.57–1.00)
GFR calc non Af Amer: 72 mL/min/{1.73_m2} (ref >59)
GFR, EST AFRICAN AMERICAN: 83 mL/min/{1.73_m2} (ref >59)
Glucose: 83 mg/dL (ref 65–99)
Potassium: 4.3 mmol/L (ref 3.5–5.2)
Sodium: 142 mmol/L (ref 134–144)

## 2017-01-16 ENCOUNTER — Other Ambulatory Visit: Payer: Self-pay | Admitting: Internal Medicine

## 2017-02-26 ENCOUNTER — Other Ambulatory Visit: Payer: Self-pay

## 2017-02-26 MED ORDER — EZETIMIBE 10 MG PO TABS: 10.0000 mg | ORAL_TABLET | Freq: Every day | ORAL | 2 refills | Status: DC

## 2017-03-30 ENCOUNTER — Telehealth: Payer: Self-pay | Admitting: Internal Medicine

## 2017-03-30 NOTE — Telephone Encounter (Signed)
Pt came in and dropped off Disability parking papers. Placed in Dr. Melina Schoolsullo's color folder up front.

## 2017-03-31 NOTE — Telephone Encounter (Signed)
Placed in red folder  

## 2017-03-31 NOTE — Telephone Encounter (Signed)
Spoke with pt and informed her that the handicap placard paperwork is complete and she can come by to pick it up. Paperwork has been placed up front in folder.

## 2017-05-04 ENCOUNTER — Ambulatory Visit (INDEPENDENT_AMBULATORY_CARE_PROVIDER_SITE_OTHER): Payer: Medicare Other | Admitting: Internal Medicine

## 2017-05-04 ENCOUNTER — Encounter: Payer: Self-pay | Admitting: Internal Medicine

## 2017-05-04 VITALS — BP 126/58 | HR 67 | Temp 97.8°F | Resp 14 | Ht 59.0 in | Wt 109.4 lb

## 2017-05-04 DIAGNOSIS — Z79899 Other long term (current) drug therapy: Secondary | ICD-10-CM | POA: Diagnosis not present

## 2017-05-04 DIAGNOSIS — E782 Mixed hyperlipidemia: Secondary | ICD-10-CM | POA: Diagnosis not present

## 2017-05-04 DIAGNOSIS — R600 Localized edema: Secondary | ICD-10-CM

## 2017-05-04 DIAGNOSIS — I872 Venous insufficiency (chronic) (peripheral): Secondary | ICD-10-CM

## 2017-05-04 DIAGNOSIS — I272 Pulmonary hypertension, unspecified: Secondary | ICD-10-CM

## 2017-05-04 LAB — COMPREHENSIVE METABOLIC PANEL
ALT: 18 U/L (ref 0–35)
AST: 32 U/L (ref 0–37)
Albumin: 3.7 g/dL (ref 3.5–5.2)
Alkaline Phosphatase: 71 U/L (ref 39–117)
BILIRUBIN TOTAL: 0.5 mg/dL (ref 0.2–1.2)
BUN: 19 mg/dL (ref 6–23)
CALCIUM: 9.6 mg/dL (ref 8.4–10.5)
CHLORIDE: 103 meq/L (ref 96–112)
CO2: 31 meq/L (ref 19–32)
CREATININE: 0.94 mg/dL (ref 0.40–1.20)
GFR: 59.77 mL/min — ABNORMAL LOW (ref >60.00)
GLUCOSE: 173 mg/dL — AB (ref 70–99)
Potassium: 3.9 mEq/L (ref 3.5–5.1)
SODIUM: 140 meq/L (ref 135–145)
Total Protein: 6.6 g/dL (ref 6.0–8.3)

## 2017-05-04 MED ORDER — ZOSTER VAC RECOMB ADJUVANTED 50 MCG/0.5ML IM SUSR: 0.5000 mL | Freq: Once | INTRAMUSCULAR | 1 refills | Status: AC

## 2017-05-04 NOTE — Patient Instructions (Addendum)
You are doing very well!  Your blood pressure is perfect on your current regimen  I will see you in 6 months.    The ShingRx vaccine is now available in local pharmacies and is much more protective thant Zostavaxs,  It is therefore ADVISED for all interested adults over 50 to prevent shingles .

## 2017-05-04 NOTE — Progress Notes (Signed)
Subjective:  Patient ID: Brandi [name], female    DOB: May 13, 1927  Age: 83 y.o. MRN: [id number]  CC: The primary encounter diagnosis was Long-term use of high-risk medication. Diagnoses of Mixed hyperlipidemia, Chronic venous insufficiency, Localized edema, and Pulmonary HTN (HCC) were also pertinent to this visit.  [name] HPI Brandi [name] Brandi [name] presents for follow up on hyperlipidemia managed with Zetia , and chronic venous insufficiency management with compression knee highs . [name] Patient is taking her medications as prescribed and notes no adverse effects.    She is avoiding added salt in her diet and walking regularly about 5 times per week for exercise  VI wearing compression knee highs  DAILY   Appetite good   Exercising daily , FOR TWO HOURS.   Has been taking amlodipine,  lasix / potassium daily since march [facility]) .  Moderate mitral regiUgitation.  No symptoms with exertion    Outpatient Medications Prior to Visit  Medication Sig Dispense Refill  . aspirin 81 MG tablet Takes three times a week    . calcitonin, salmon, (MIACALCIN/FORTICAL) 200 UNIT/ACT nasal spray USE 1 SPRAY IN 1 NOSTRIL  DAILY (ALTERNATING NOSTRILS DAILY) 11.1 mL 3  . calcium carbonate (OS-CAL) 600 MG TABS Take 1,200 mg by mouth daily.    [name] ezetimibe (ZETIA) 10 MG tablet Take 1 tablet (10 mg total) by mouth daily. 90 tablet 2  . famciclovir (FAMVIR) 500 MG tablet Take 1 tablet (500 mg total) by mouth 3 (three) times daily. 21 tablet 0  . furosemide (LASIX) 20 MG tablet Take 1 tablet (20 mg total) by mouth daily. As needed for fluid retention 90 tablet 3  . Multiple Vitamin (MULTIVITAMIN) tablet Take 1 tablet by mouth daily.      . potassium chloride (K-DUR,KLOR-CON) 10 MEQ tablet Take 1 tablet (10 mEq total) by mouth daily. 90 tablet 1  . amLODipine (NORVASC) 5 MG tablet Take 1 tablet (5 mg total) by mouth daily. 90 tablet 3   No facility-administered medications prior to visit.     Review of Systems;  Patient denies  headache, fevers, malaise, unintentional weight loss, skin rash, eye pain, sinus congestion and sinus pain, sore throat, dysphagia,  hemoptysis , cough, dyspnea, wheezing, chest pain, palpitations, orthopnea, edema, abdominal pain, nausea, melena, diarrhea, constipation, flank pain, dysuria, hematuria, urinary  Frequency, nocturia, numbness, tingling, seizures,  Focal weakness, Loss of consciousness,  Tremor, insomnia, depression, anxiety, and suicidal ideation.      Objective:  BP (!) 126/58 (BP Location: Left Arm, Patient Position: Sitting, Cuff Size: Normal)   Pulse 67   Temp 97.8 F (36.6 C) (Oral)   Resp 14   Ht  (1.499 m)   Wt 109 lb 6.4 oz (49.6 kg)   SpO2 97%   BMI 22.10 kg/m   BP Readings from Last 3 Encounters:  05/04/17 (!) 126/58  11/10/16 (!) 142/62  10/31/16 132/60    Wt Readings from Last 3 Encounters:  05/04/17 109 lb 6.4 oz (49.6 kg)  11/10/16 112 lb 8 oz (51 kg)  10/31/16 112 lb 12.8 oz (51.2 kg)    General appearance: alert, cooperative and appears stated age Ears: normal TM's and external ear canals both ears Throat: lips, mucosa, and tongue normal; teeth and gums normal Neck: no adenopathy, no carotid bruit, supple, symmetrical, trachea midline and thyroid not enlarged, symmetric, no tenderness/mass/nodules Back: symmetric, no curvature. ROM normal. No CVA tenderness. Lungs: clear to auscultation bilaterally Heart: regular rate and rhythm, S1, S2  normal, no murmur, click, rub or gallop Abdomen: soft, non-tender; bowel sounds normal; no masses,  no organomegaly Pulses: 2+ and symmetric Skin: Skin color, texture, turgor normal. No rashes or lesions. Non pitting edema.  Lymph nodes: Cervical, supraclavicular, and axillary nodes normal.  No results found for: HGBA1C  Lab Results  Component Value Date   CREATININE 0.94 05/04/2017   CREATININE 0.75 12/22/2016   CREATININE 0.86 10/31/2016    Lab Results  Component Value Date   WBC 6.1 08/23/2015     HGB 14.1 08/23/2015   HCT 43.0 08/23/2015   PLT 239.0 08/23/2015   GLUCOSE 173 (H) 05/04/2017   CHOL 167 03/27/2016   TRIG 65.0 03/27/2016   HDL 60.40 03/27/2016   LDLDIRECT 91.0 10/31/2016   LDLCALC 94 03/27/2016   ALT 18 05/04/2017   AST 32 05/04/2017   NA 140 05/04/2017   K 3.9 05/04/2017   CL 103 05/04/2017   CREATININE 0.94 05/04/2017   BUN 19 05/04/2017   CO2 31 05/04/2017   TSH 1.11 10/31/2016    US Venous Img Lower Unilateral Right  Result Date: 07/15/2016 CLINICAL DATA:  Right lower extremity swelling for 3 week EXAM: RIGHT LOWER EXTREMITY VENOUS DUPLEX ULTRASOUND TECHNIQUE: Doppler venous assessment of the right lower extremity deep venous system was performed, including characterization of spectral flow, compressibility, and phasicity. COMPARISON:  None. FINDINGS: There is complete compressibility of the right common femoral, femoral, and popliteal veins. Doppler analysis demonstrates respiratory phasicity and augmentation of flow with calf compression. No obvious superficial vein or calf vein thrombosis. 4.9 x 0.9 x 2.5 cm right popliteal fossa Baker's cyst. IMPRESSION: No evidence of right lower extremity DVT. Moderate sized right Baker's cyst. Electronically Signed   By: Jolaine Click M.D.   On: 07/15/2016 14:44    Assessment & Plan:   Problem List Items Addressed This Visit    Chronic venous insufficiency    Managed with compression knee highs      Edema    Multifactorial with V and right sided heart failure suggested by elevated pulmonary pressure . Has been taking daily lasix since march . .  Lab Results  Component Value Date   NA 140 05/04/2017   K 3.9 05/04/2017   CL 103 05/04/2017   CO2 31 05/04/2017   Lab Results  Component Value Date   CREATININE 0.94 05/04/2017          Hyperlipidemia    Managed with Zetia due to intolerance of other statins,  LFTS are normal  , LDL was 91 in March . No changes today    Lab Results  Component Value Date    CHOL 167 03/27/2016   HDL 60.40 03/27/2016   LDLCALC 94 03/27/2016   LDLDIRECT 91.0 10/31/2016   TRIG 65.0 03/27/2016   CHOLHDL 3 03/27/2016   Lab Results  Component Value Date   ALT 18 05/04/2017   AST 32 05/04/2017   ALKPHOS 71 05/04/2017   BILITOT 0.5 05/04/2017         Pulmonary HTN (HCC)    She is asymptomatic despite exercising for 2 hours daily.         Other Visit Diagnoses    Long-term use of high-risk medication    -  Primary   Relevant Orders   Comprehensive metabolic panel (Completed)      I am having Ms. Tankard start on Zoster Vac Recomb Adjuvanted. I am also having her maintain her multivitamin, aspirin, calcium carbonate, famciclovir, amLODipine, furosemide, potassium  chloride, calcitonin (salmon), and ezetimibe.  Meds ordered this encounter  Medications  . Zoster Vac Recomb Adjuvanted [facility]) injection    Sig: Inject 0.5 mLs into the muscle once.    Dispense:  1 each    Refill:  1    There are no discontinued medications.  Follow-up: Return in about 6 months (around 11/01/2017).   [name], MD

## 2017-05-05 NOTE — Assessment & Plan Note (Signed)
Managed with compression knee highs

## 2017-05-05 NOTE — Assessment & Plan Note (Signed)
She is asymptomatic despite exercising for 2 hours daily.

## 2017-05-05 NOTE — Assessment & Plan Note (Signed)
Managed with Zetia due to intolerance of other statins,  LFTS are normal  , LDL was 91 in March . No changes today    Lab Results  Component Value Date   CHOL 167 03/27/2016   HDL 60.40 03/27/2016   LDLCALC 94 03/27/2016   LDLDIRECT 91.0 10/31/2016   TRIG 65.0 03/27/2016   CHOLHDL 3 03/27/2016   Lab Results  Component Value Date   ALT 18 05/04/2017   AST 32 05/04/2017   ALKPHOS 71 05/04/2017   BILITOT 0.5 05/04/2017

## 2017-05-05 NOTE — Assessment & Plan Note (Signed)
Multifactorial with V and right sided heart failure suggested by elevated pulmonary pressure . Has been taking daily lasix since march . .  Lab Results  Component Value Date   NA 140 05/04/2017   K 3.9 05/04/2017   CL 103 05/04/2017   CO2 31 05/04/2017   Lab Results  Component Value Date   CREATININE 0.94 05/04/2017

## 2017-05-06 ENCOUNTER — Other Ambulatory Visit: Payer: Self-pay | Admitting: Internal Medicine

## 2017-05-06 DIAGNOSIS — E782 Mixed hyperlipidemia: Secondary | ICD-10-CM

## 2017-05-06 DIAGNOSIS — R7301 Impaired fasting glucose: Secondary | ICD-10-CM

## 2017-05-09 NOTE — Progress Notes (Signed)
Cardiology Office Note  Date:  05/11/2017   ID:  Morenike, Cuff Oct 11, 1926, MRN [id number]  PCP:  [name], MD   Chief Complaint  Patient presents with  . other    6 month follow up. Meds reviewed by the pt. verbally. "doing well."     HPI:  83 year old woman with  anterior leaflet MVP and moderate to severe MR,  Hyperlipidemia  Mild to moderate  aortic valve stenosis On echocardiogram who presents for routine followup of her mitral and aortic valve disease.  In follow-up today she reports that she is doing very well with no complaints On lasix and potassium daily, denies any shortness of breath Compression hose, leg edema has resolved Walking daily, 1 mile a day at 6 Am Doing zumba 3 x per week  Overall feels well, tolerating her medications Does not feel at she needs more workup for her mitral valve regurgitation at this time Previous echocardiogram detailed below reviewed with her on today's visit  She lives at [facility] [place], Significant social supports. Tolerating zetia.  Lab work reviewed with her in detail Total cholesterol 167   EKG personally reviewed by myself on todays visit Shows normal sinus rhythm rate 58 bpm T-wave abnormality in lead 3 and aVF  Other past medical history reviewed Echo 10/23/2016 Reviewed with her in detail, images pulled up in the office for her to review Normal size LV   The estimated ejection fraction was in the range of 60% to 65%. Aortic valve:  mild stenosis: - Mitral valve: Moderate myxomatous degeneration. Prolapse noted of anterior mitral valve leaflet. There was moderate to severe regurgitation. - Left atrium: The atrium was severely dilated. - Right atrium: The atrium was moderately dilated. - Tricuspid valve: There was moderate-severe regurgitation. - Pulmonary arteries: Systolic pressure was moderately increased.   PA peak pressure: 54 mm Hg (S).  Other past medical history lost her husband. He had a long history  of severe pulmonary hypertension, chronic diastolic CHF. mother passed away at age 87. father did pass away at age 80 from a large MI, others in her family have lived into their 41s.  2-D echocardiogram May 08, 2009 showed normal left ventricular systolic function. Bileaflet mitral valve prolapse with moderate mitral regurgitation.    PMH:   has a past medical history of Hyperlipidemia; Mitral regurgitation; and Mitral valve prolapse.  PSH:    Past Surgical History:  Procedure Laterality Date  . BIOPSY BREAST  1980   left ,  benign  . BREAST BIOPSY  1980   normal  . Hospitalized for endocarditis  1987    Current Outpatient Prescriptions  Medication Sig Dispense Refill  . amLODipine (NORVASC) 5 MG tablet Take 1 tablet (5 mg total) by mouth daily. 90 tablet 3  . aspirin 81 MG tablet Takes three times a week    . calcitonin, salmon, (MIACALCIN/FORTICAL) 200 UNIT/ACT nasal spray USE 1 SPRAY IN 1 NOSTRIL  DAILY (ALTERNATING NOSTRILS DAILY) 11.1 mL 3  . calcium carbonate (OS-CAL) 600 MG TABS Take 1,200 mg by mouth daily.    [name] ezetimibe (ZETIA) 10 MG tablet Take 1 tablet (10 mg total) by mouth daily. 90 tablet 2  . famciclovir (FAMVIR) 500 MG tablet Take 1 tablet (500 mg total) by mouth 3 (three) times daily. 21 tablet 0  . furosemide (LASIX) 20 MG tablet Take 1 tablet (20 mg total) by mouth daily. As needed for fluid retention 90 tablet 3  . Multiple  Vitamin (MULTIVITAMIN) tablet Take 1 tablet by mouth daily.      . potassium chloride (K-DUR,KLOR-CON) 10 MEQ tablet Take 1 tablet (10 mEq total) by mouth daily. 90 tablet 1   No current facility-administered medications for this visit.      Allergies:   Codeine; Statins; and Sulfonamide derivatives   Social History:  The patient  reports that she has never smoked. She has never used smokeless tobacco. She reports that she does not drink alcohol or use drugs.   Family History:   family history includes Cancer in her sister;  Coronary artery disease in her father; Heart attack in her father; Heart disease in her brother.    Review of Systems: Review of Systems  Constitutional: Negative.   Respiratory: Negative.   Cardiovascular: Negative.   Gastrointestinal: Negative.   Musculoskeletal: Negative.   Neurological: Negative.   Psychiatric/Behavioral: Negative.   All other systems reviewed and are negative.    PHYSICAL EXAM: VS:  BP 140/60 (BP Location: Left Arm, Patient Position: Sitting, Cuff Size: Normal)   Pulse (!) 58   Ht  (1.499 m)   Wt 109 lb 8 oz (49.7 kg)   BMI 22.12 kg/m  , BMI Body mass index is 22.12 kg/m. GEN: Well nourished, well developed, in no acute distress  HEENT: normal  Neck: no JVD, carotid bruits, or masses Cardiac: RRR; 2/6 SEM RSB, LSB murmurs, rubs, or gallops,No significant leg edema , Compression hose in place Respiratory:  clear to auscultation bilaterally, normal work of breathing GI: soft, nontender, nondistended, + BS MS: no deformity or atrophy  Skin: warm and dry, no rash Neuro:  Strength and sensation are intact Psych: euthymic mood, full affect    Recent Labs: 10/31/2016: TSH 1.11 05/04/2017: ALT 18; BUN 19; Creatinine, Ser 0.94; Potassium 3.9; Sodium 140    Lipid Panel Lab Results  Component Value Date   CHOL 167 03/27/2016   HDL 60.40 03/27/2016   LDLCALC 94 03/27/2016   TRIG 65.0 03/27/2016      Wt Readings from Last 3 Encounters:  05/11/17 109 lb 8 oz (49.7 kg)  05/04/17 109 lb 6.4 oz (49.6 kg)  11/10/16 112 lb 8 oz (51 kg)       ASSESSMENT AND PLAN:  Mixed hyperlipidemia Cholesterol is at goal on the current lipid regimen. No changes to the medications were made.  Chronic venous insufficiency Recommended compression hose  MITRAL REGURGITATION Normal LV size and function on echocardiogram March 2018 Asymptomatic Though continue current medications  Aortic valve stenosis, etiology of cardiac valve disease unspecified Mild  aortic valve stenosis by velocity and mean gradient  MVP (mitral valve prolapse) Long history of prolapse, at least moderate MR, Eccentric Asymptomatic, normal LV function Recommended we consider repeat echocardiogram 2019  Pulmonary HTN Good exercise tolerance Tolerating Lasix with potassium For shortness of breath symptoms, consider right heart catheterization  Disposition:   F/U  12 months   Total encounter time more than 25 minutes  Greater than 50% was spent in counseling and coordination of care with the patient   No orders of the defined types were placed in this encounter.    Signed, Dossie Arbour, M.D., Ph.D. 05/11/2017  [facility] [place], [place] [phone]

## 2017-05-11 ENCOUNTER — Ambulatory Visit (INDEPENDENT_AMBULATORY_CARE_PROVIDER_SITE_OTHER): Payer: Medicare Other | Admitting: Cardiovascular Disease

## 2017-05-11 ENCOUNTER — Encounter: Payer: Self-pay | Admitting: Cardiovascular Disease

## 2017-05-11 VITALS — BP 140/60 | HR 58 | Ht 59.0 in | Wt 109.5 lb

## 2017-05-11 DIAGNOSIS — R600 Localized edema: Secondary | ICD-10-CM

## 2017-05-11 DIAGNOSIS — I272 Pulmonary hypertension, unspecified: Secondary | ICD-10-CM | POA: Diagnosis not present

## 2017-05-11 DIAGNOSIS — E782 Mixed hyperlipidemia: Secondary | ICD-10-CM | POA: Diagnosis not present

## 2017-05-11 DIAGNOSIS — I350 Nonrheumatic aortic (valve) stenosis: Secondary | ICD-10-CM | POA: Diagnosis not present

## 2017-05-11 DIAGNOSIS — I080 Rheumatic disorders of both mitral and aortic valves: Secondary | ICD-10-CM

## 2017-05-11 NOTE — Patient Instructions (Signed)

## 2017-05-22 ENCOUNTER — Other Ambulatory Visit (INDEPENDENT_AMBULATORY_CARE_PROVIDER_SITE_OTHER): Payer: Medicare Other

## 2017-05-22 DIAGNOSIS — E782 Mixed hyperlipidemia: Secondary | ICD-10-CM | POA: Diagnosis not present

## 2017-05-22 DIAGNOSIS — R7301 Impaired fasting glucose: Secondary | ICD-10-CM

## 2017-05-22 LAB — BASIC METABOLIC PANEL
BUN: 15 mg/dL (ref 6–23)
CHLORIDE: 103 meq/L (ref 96–112)
CO2: 30 mEq/L (ref 19–32)
Calcium: 9.7 mg/dL (ref 8.4–10.5)
Creatinine, Ser: 0.84 mg/dL (ref 0.40–1.20)
GFR: 68.04 mL/min (ref >60.00)
GLUCOSE: 86 mg/dL (ref 70–99)
POTASSIUM: 3.9 meq/L (ref 3.5–5.1)
Sodium: 138 mEq/L (ref 135–145)

## 2017-05-22 LAB — LIPID PANEL
Cholesterol: 168 mg/dL (ref 0–200)
HDL: 64.6 mg/dL (ref >39.00)
LDL CALC: 86 mg/dL (ref 0–99)
NonHDL: 103.81
Total CHOL/HDL Ratio: 3
Triglycerides: 90 mg/dL (ref 0.0–149.0)
VLDL: 18 mg/dL (ref 0.0–40.0)

## 2017-05-22 LAB — HEMOGLOBIN A1C: HEMOGLOBIN A1C: 5.5 % (ref 4.6–6.5)

## 2017-05-24 ENCOUNTER — Other Ambulatory Visit: Payer: Self-pay | Admitting: Internal Medicine

## 2017-05-24 ENCOUNTER — Encounter: Payer: Self-pay | Admitting: Internal Medicine

## 2017-05-24 DIAGNOSIS — Z79899 Other long term (current) drug therapy: Secondary | ICD-10-CM | POA: Insufficient documentation

## 2017-06-02 DIAGNOSIS — T148XXA Other injury of unspecified body region, initial encounter: Secondary | ICD-10-CM | POA: Diagnosis not present

## 2017-06-02 DIAGNOSIS — L03115 Cellulitis of right lower limb: Secondary | ICD-10-CM | POA: Diagnosis not present

## 2017-06-08 NOTE — Telephone Encounter (Signed)
Mailed unread message to patient. thanks 

## 2017-06-11 ENCOUNTER — Encounter: Payer: Self-pay | Admitting: Family

## 2017-06-11 ENCOUNTER — Ambulatory Visit (INDEPENDENT_AMBULATORY_CARE_PROVIDER_SITE_OTHER): Payer: Medicare Other | Admitting: Family

## 2017-06-11 VITALS — BP 138/56 | HR 63 | Temp 97.9°F | Resp 12 | Wt 112.0 lb

## 2017-06-11 DIAGNOSIS — S81811A Laceration without foreign body, right lower leg, initial encounter: Secondary | ICD-10-CM

## 2017-06-11 DIAGNOSIS — Z23 Encounter for immunization: Secondary | ICD-10-CM | POA: Diagnosis not present

## 2017-06-11 MED ORDER — MUPIROCIN 2 % EX OINT: 1.0000 "application " | TOPICAL_OINTMENT | Freq: Two times a day (BID) | CUTANEOUS | 0 refills | Status: DC

## 2017-06-11 NOTE — Patient Instructions (Signed)
Glad to see improving.  May use ointment.   Let us know if doesn't continue to improve

## 2017-06-11 NOTE — Progress Notes (Signed)
Subjective:    Patient ID: Brandi GrosJean M Turnbaugh, female    DOB: 18-Apr-1927, 83 y.o.   MRN: [id number]  CC: Brandi [name] is a 83 y.o. female who presents today for follow up.   HPI: 2 weeks ago hit ago, on right lateral ankle. Didn't fall. At home, was using antibiotic ointment at home.  No fever, chills,leg swelling, sob  Seen at urgent care 2 weeks ago for wound to right leg, completed keflex 500 TID. Has kept wound wrapped. Redness and size improving. Over all feels well.       HISTORY:  Past Medical History:  Diagnosis Date  . Hyperlipidemia   . Mitral regurgitation   . Mitral valve prolapse    Past Surgical History:  Procedure Laterality Date  . BIOPSY BREAST  1980   left ,  benign  . BREAST BIOPSY  1980   normal  . Hospitalized for endocarditis  1987   Family History  Problem Relation Age of Onset  . Heart attack Father   . Coronary artery disease Father   . Cancer Sister        esophageal  . Heart disease Brother     Allergies: Codeine; Statins; and Sulfonamide derivatives Current Outpatient Prescriptions on File Prior to Visit  Medication Sig Dispense Refill  . aspirin 81 MG tablet Takes three times a week    . calcitonin, salmon, (MIACALCIN/FORTICAL) 200 UNIT/ACT nasal spray USE 1 SPRAY IN 1 NOSTRIL  DAILY (ALTERNATING NOSTRILS DAILY) 11.1 mL 3  . calcium carbonate (OS-CAL) 600 MG TABS Take 1,200 mg by mouth daily.    [name]. ezetimibe (ZETIA) 10 MG tablet Take 1 tablet (10 mg total) by mouth daily. 90 tablet 2  . famciclovir (FAMVIR) 500 MG tablet Take 1 tablet (500 mg total) by mouth 3 (three) times daily. 21 tablet 0  . furosemide (LASIX) 20 MG tablet Take 1 tablet (20 mg total) by mouth daily. As needed for fluid retention 90 tablet 3  . Multiple Vitamin (MULTIVITAMIN) tablet Take 1 tablet by mouth daily.      . potassium chloride (K-DUR,KLOR-CON) 10 MEQ tablet Take 1 tablet (10 mEq total) by mouth daily. 90 tablet 1  . amLODipine (NORVASC) 5 MG tablet Take 1 tablet  (5 mg total) by mouth daily. 90 tablet 3   No current facility-administered medications on file prior to visit.     Social History  Substance Use Topics  . Smoking status: Never Smoker  . Smokeless tobacco: Never Used  . Alcohol use No    Review of Systems  Constitutional: Negative for chills and fever.  Respiratory: Negative for cough.   Cardiovascular: Negative for chest pain and palpitations.  Gastrointestinal: Negative for nausea and vomiting.  Skin: Positive for wound.      Objective:    BP (!) 138/56 (BP Location: Right Arm, Patient Position: Sitting, Cuff Size: Normal)   Pulse 63   Temp 97.9 F (36.6 C) (Oral)   Resp 12   Wt 112 lb (50.8 kg)   SpO2 96%   BMI 22.62 kg/m  BP Readings from Last 3 Encounters:  06/11/17 (!) 138/56  05/11/17 140/60  05/04/17 (!) 126/58   Wt Readings from Last 3 Encounters:  06/11/17 112 lb (50.8 kg)  05/11/17 109 lb 8 oz (49.7 kg)  05/04/17 109 lb 6.4 oz (49.6 kg)    Physical Exam  Constitutional: She appears well-developed and well-nourished.  Eyes: Conjunctivae are normal.  Cardiovascular: Normal rate, regular rhythm, normal  heart sounds and normal pulses.   No le edema Palpable pedal pulses  Pulmonary/Chest: Effort normal and breath sounds normal. She has no wheezes. She has no rhonchi. She has no rales.  Neurological: She is alert.  Skin: Skin is warm and dry. Abrasion noted.     Skin tear approx 2 cm in diameter noted right lateral leg . No purulent drainage, erythema, increased warmth  Psychiatric: She has a normal mood and affect. Her speech is normal and behavior is normal. Thought content normal.  Vitals reviewed.      Assessment & Plan:   1. Noninfected skin tear of right lower extremity, initial encounter Afebrile and well-appearing. Reassured as skin tear has been improving. She was covered appropriately with Keflex. No  signs of infection today. Advised patient she may use mupirocin ointment as needed. She  will stay vigilant and let us know if it does not continue to heal   I am having Ms. Macchia start on mupirocin ointment. I am also having her maintain her multivitamin, aspirin, calcium carbonate, famciclovir, amLODipine, furosemide, potassium chloride, calcitonin (salmon), and ezetimibe.   Meds ordered this encounter  Medications  . mupirocin ointment (BACTROBAN) 2 %    Sig: Apply 1 application topically 2 (two) times daily.    Dispense:  22 g    Refill:  0    Order Specific Question:   Supervising Provider    Answer:   [name] [[id number]]    Return precautions given.   Risks, benefits, and alternatives of the medications and treatment plan prescribed today were discussed, and patient expressed understanding.   Education regarding symptom management and diagnosis given to patient on AVS.  Continue to follow with [name], MD for routine health maintenance.   Lillia Dallas Wentzell and I agreed with plan.   Rennie Plowman, FNP

## 2017-06-30 ENCOUNTER — Ambulatory Visit: Payer: Self-pay | Admitting: *Deleted

## 2017-06-30 ENCOUNTER — Encounter: Payer: Self-pay | Admitting: Internal Medicine

## 2017-06-30 DIAGNOSIS — B309 Viral conjunctivitis, unspecified: Secondary | ICD-10-CM | POA: Diagnosis not present

## 2017-06-30 NOTE — Telephone Encounter (Signed)
Cough with congestion, increase nasal congestion. Home care advice given.  Pt also requested appointment, given.  Reason for Disposition . Cough  Answer Assessment - Initial Assessment Questions 1. ONSET: "When did the nasal discharge start?"      A week  2. AMOUNT: "How much discharge is there?"      A lot 3. COUGH: "Do you have a cough?" If yes, ask: "Describe the color of your sputum" (clear, white, yellow, green)     Yes, yellow 4. RESPIRATORY DISTRESS: "Describe your breathing."      no 5. FEVER: "Do you have a fever?" If so, ask: "What is your temperature, how was it measured, and when did it start?"     no 6. SEVERITY: "Overall, how bad are you feeling right now?" (e.g., doesn't interfere with normal activities, staying home from school/work, staying in bed)      Doesn't interfere with normal activities 7. OTHER SYMPTOMS: "Do you have any other symptoms?" (e.g., sore throat, earache, wheezing, vomiting)     no 8. PREGNANCY: "Is there any chance you are pregnant?" "When was your last menstrual period?"     no  Protocols used: COUGH - ACUTE PRODUCTIVE-A-AH, COMMON COLD-A-AH

## 2017-06-30 NOTE — Telephone Encounter (Signed)
This encounter was created in error - please disregard.

## 2017-07-01 ENCOUNTER — Encounter: Payer: Self-pay | Admitting: Family Medicine

## 2017-07-01 ENCOUNTER — Ambulatory Visit (INDEPENDENT_AMBULATORY_CARE_PROVIDER_SITE_OTHER): Payer: Medicare Other | Admitting: Family Medicine

## 2017-07-01 VITALS — BP 136/64 | HR 86 | Temp 98.2°F | Wt 109.8 lb

## 2017-07-01 DIAGNOSIS — J069 Acute upper respiratory infection, unspecified: Secondary | ICD-10-CM

## 2017-07-01 DIAGNOSIS — B9789 Other viral agents as the cause of diseases classified elsewhere: Secondary | ICD-10-CM

## 2017-07-01 MED ORDER — BENZONATATE 100 MG PO CAPS: 100.0000 mg | ORAL_CAPSULE | Freq: Two times a day (BID) | ORAL | 0 refills | Status: DC | PRN

## 2017-07-01 NOTE — Patient Instructions (Signed)
It was a pleasure to see you today  I have sent in a cough suppressant pill to your pharmacy  Please take plain Mucinex (generic is fine) as directed on the package to help thin your secretions  Drink enough fluids to make your urine light yellow  If not better in 5-7 days, please give us a call  If you develop fever over 100, wheeze or shortness of breath please call the office

## 2017-07-01 NOTE — Progress Notes (Signed)
Subjective:    Patient ID: Brandi [name], female    DOB: 08/21/1926, 83 y.o.   MRN: [id number]  HPI This is an 83 yo female who presents today with cough x 1 week. Mostly dry but got a lot of yellow sputum up this morning. Doesn't feel bad. Taking Nyquil and aspirin without relief. No fever, no wheeze, no SOB, no headache or ear pain. Some crusting of eyes. Saw eye doctor yesterday and was given eye ointment which helped. She feels better today than she did yesterday. Energy level good. [facility].   Past Medical History:  Diagnosis Date  . Hyperlipidemia   . Mitral regurgitation   . Mitral valve prolapse    Past Surgical History:  Procedure Laterality Date  . BIOPSY BREAST  1980   left ,  benign  . BREAST BIOPSY  1980   normal  . Hospitalized for endocarditis  1987   Family History  Problem Relation Age of Onset  . Heart attack Father   . Coronary artery disease Father   . Cancer Sister        esophageal  . Heart disease Brother    Social History   Tobacco Use  . Smoking status: Never Smoker  . Smokeless tobacco: Never Used  Substance Use Topics  . Alcohol use: No  . Drug use: No      Review of Systems Per HPI    Objective:   Physical Exam  Constitutional: She is oriented to person, place, and time. She appears well-developed and well-nourished. No distress.  HENT:  Head: Normocephalic and atraumatic.  Right Ear: Tympanic membrane, external ear and ear canal normal.  Left Ear: Tympanic membrane, external ear and ear canal normal.  Nose: Rhinorrhea present.  Mouth/Throat: Uvula is midline, oropharynx is clear and moist and mucous membranes are normal.  Eyes: Right conjunctiva is injected. Left conjunctiva is injected.  Ointment present on lower lids. No colored discharge.   Neck: Normal range of motion. Neck supple.  Cardiovascular: Normal rate, regular rhythm and normal heart sounds.  Pulmonary/Chest: Effort normal and breath sounds normal. No  respiratory distress. She has no wheezes. She has no rales.  Lymphadenopathy:    She has no cervical adenopathy.  Neurological: She is alert and oriented to person, place, and time.  Skin: Skin is warm and dry. She is not diaphoretic.  Psychiatric: She has a normal mood and affect. Her behavior is normal. Judgment and thought content normal.  Vitals reviewed.     BP 136/64 (BP Location: Left Arm, Patient Position: Sitting, Cuff Size: Normal)   Pulse 86   Temp 98.2 F (36.8 C) (Oral)   Wt 109 lb 12 oz (49.8 kg)   SpO2 99%   BMI 22.17 kg/m  Wt Readings from Last 3 Encounters:  07/01/17 109 lb 12 oz (49.8 kg)  06/11/17 112 lb (50.8 kg)  05/11/17 109 lb 8 oz (49.7 kg)       Assessment & Plan:  1. Viral URI with cough - likely viral, no worrisome findings on exam or history, feels better today - benzonatate (TESSALON) 100 MG capsule; Take 1 capsule (100 mg total) 2 (two) times daily as needed by mouth for cough.  Dispense: 20 capsule; Refill: 0 -  Patient Instructions  It was a pleasure to see you today  I have sent in a cough suppressant pill to your pharmacy  Please take plain Mucinex (generic is fine) as directed on the package to  help thin your secretions  Drink enough fluids to make your urine light yellow  If not better in 5-7 days, please give us a call  If you develop fever over 100, wheeze or shortness of breath please call the office     Olean Reeeborah Zilphia Kozinski, FNP-BC  [facility], [facility]  07/01/2017 8:20 AM

## 2017-07-16 DIAGNOSIS — H90A22 Sensorineural hearing loss, unilateral, left ear, with restricted hearing on the contralateral side: Secondary | ICD-10-CM | POA: Diagnosis not present

## 2017-07-17 DIAGNOSIS — H903 Sensorineural hearing loss, bilateral: Secondary | ICD-10-CM | POA: Diagnosis not present

## 2017-09-02 ENCOUNTER — Other Ambulatory Visit: Payer: Self-pay | Admitting: Internal Medicine

## 2017-09-07 ENCOUNTER — Ambulatory Visit: Payer: BLUE CROSS/BLUE SHIELD

## 2017-09-09 ENCOUNTER — Ambulatory Visit (INDEPENDENT_AMBULATORY_CARE_PROVIDER_SITE_OTHER): Payer: Medicare Other

## 2017-09-09 VITALS — BP 122/62 | HR 71 | Temp 97.9°F | Resp 14 | Ht 59.25 in | Wt 114.0 lb

## 2017-09-09 DIAGNOSIS — Z0000 Encounter for general adult medical examination without abnormal findings: Secondary | ICD-10-CM | POA: Diagnosis not present

## 2017-09-09 DIAGNOSIS — Z1331 Encounter for screening for depression: Secondary | ICD-10-CM

## 2017-09-09 NOTE — Patient Instructions (Addendum)
  Ms. Brandi [name] , Thank you for taking time to come for your Medicare Wellness Visit. I appreciate your ongoing commitment to your health goals. Please review the following plan we discussed and let me know if I can assist you in the future.   Follow up with Dr. Darrick Huntsmanullo as needed.    Bring a copy of your Health Care Power of Attorney and/or Living Will to be scanned into chart.  Have a great day and stay warm!  These are the goals we discussed: Goals    . Increase water intake     Stay hydrated and drink plenty of water/fluids        This is a list of the screening recommended for you and due dates:  Health Maintenance  Topic Date Due  . Urine Protein Check  08/12/1939  . Complete foot exam   08/20/2016  . Hemoglobin A1C  11/20/2017  . Eye exam for diabetics  12/23/2017  . Tetanus Vaccine  12/26/2020  . Flu Shot  Completed  . DEXA scan (bone density measurement)  Completed  . Pneumonia vaccines  Completed

## 2017-09-09 NOTE — Progress Notes (Signed)
Subjective:   Brandi [name] is a 84 y.o. female who presents for an Initial Medicare Annual Wellness Visit.  Review of Systems    No ROS.  Medicare Wellness Visit. Additional risk factors are reflected in the social history.  Cardiac Risk Factors include: advanced age (>87men, >57 women);hypertension     Objective:    Today's Vitals   09/09/17 1459  BP: 122/62  Pulse: 71  Resp: 14  Temp: 97.9 F (36.6 C)  TempSrc: Oral  SpO2: 98%  Weight: 114 lb (51.7 kg)  Height: 4' 11.25" (1.505 m)   Body mass index is 22.83 kg/m.  Advanced Directives 09/09/2017 09/05/2016 09/06/2015  Does Patient Have a Medical Advance Directive? Yes Yes Yes  Type of Estate agent of [place];Living will Living will;Healthcare Power of [employer] Power of [place];Living will  Does patient want to make changes to medical advance directive? No - Patient declined No - Patient declined No - Patient declined  Copy of Healthcare Power of Attorney in Chart? No - copy requested No - copy requested No - copy requested    Current Medications (verified) Outpatient Encounter Medications as of 09/09/2017  Medication Sig  . amLODipine (NORVASC) 5 MG tablet Take 1 tablet (5 mg total) by mouth daily.  [name] aspirin 81 MG tablet Takes three times a week  . calcitonin, salmon, (MIACALCIN/FORTICAL) 200 UNIT/ACT nasal spray USE 1 SPRAY IN 1 NOSTRIL  DAILY (ALTERNATING NOSTRILS DAILY)  . calcium carbonate (OS-CAL) 600 MG TABS Take 1,200 mg by mouth daily.  [name] ezetimibe (ZETIA) 10 MG tablet TAKE 1 TABLET BY MOUTH  DAILY  . famciclovir (FAMVIR) 500 MG tablet Take 1 tablet (500 mg total) by mouth 3 (three) times daily.  . furosemide (LASIX) 20 MG tablet Take 1 tablet (20 mg total) by mouth daily. As needed for fluid retention  . Multiple Vitamin (MULTIVITAMIN) tablet Take 1 tablet by mouth daily.    . potassium chloride (K-DUR,KLOR-CON) 10 MEQ tablet Take 1 tablet (10 mEq total) by mouth daily.  .  [DISCONTINUED] benzonatate (TESSALON) 100 MG capsule Take 1 capsule (100 mg total) 2 (two) times daily as needed by mouth for cough.  . [DISCONTINUED] mupirocin ointment (BACTROBAN) 2 % Apply 1 application topically 2 (two) times daily.  . [DISCONTINUED] neomycin-polymyxin b-dexamethasone (MAXITROL) 3.5-10000-0.1 OINT    No facility-administered encounter medications on file as of 09/09/2017.     Allergies (verified) Codeine; Statins; and Sulfonamide derivatives   History: Past Medical History:  Diagnosis Date  . Hyperlipidemia   . Mitral regurgitation   . Mitral valve prolapse    Past Surgical History:  Procedure Laterality Date  . BIOPSY BREAST  1980   left ,  benign  . BREAST BIOPSY  1980   normal  . Hospitalized for endocarditis  1987   Family History  Problem Relation Age of Onset  . Heart attack Father   . Coronary artery disease Father   . Cancer Sister        esophageal  . Heart disease Brother    Social History   Socioeconomic History  . Marital status: Widowed    Spouse name: None  . Number of children: 0  . Years of education: None  . Highest education level: None  Social Needs  . Financial resource strain: None  . Food insecurity - worry: None  . Food insecurity - inability: None  . Transportation needs - medical: None  . Transportation needs - non-medical: None  Occupational  History  . Occupation: retired  Tobacco Use  . Smoking status: Never Smoker  . Smokeless tobacco: Never Used  Substance and Sexual Activity  . Alcohol use: No  . Drug use: No  . Sexual activity: Not Currently  Other Topics Concern  . None  Social History Narrative   [place]ives at [facility]   Exercise, walks 7 days a week with aerobic exercise 2 days a week       Tobacco Counseling Counseling given: Not Answered   Clinical Intake:  Pre-visit preparation completed: Yes  Pain : No/denies pain     Nutritional Status: BMI of 19-24  Normal Diabetes: No  How often do  you need to have someone help you when you read instructions, pamphlets, or other written materials from your doctor or pharmacy?: 1 - Never  Interpreter Needed?: No      Activities of Daily Living In your present state of health, do you have any difficulty performing the following activities: 09/09/2017  Hearing? Y  Vision? N  Difficulty concentrating or making decisions? Y  Comment Difficulty remembering names  Walking or climbing stairs? N  Dressing or bathing? N  Doing errands, shopping? N  Preparing Food and eating ? N  Using the Toilet? N  In the past six months, have you accidently leaked urine? N  Do you have problems with loss of bowel control? N  Managing your Medications? N  Managing your Finances? N  Housekeeping or managing your Housekeeping? N  Some recent data might be hidden     Immunizations and Health Maintenance Immunization History  Administered Date(s) Administered  . 19-influenza Whole 06/11/2017  . H1N1 10/12/2008  . Influenza Split 06/17/2011  . Influenza Whole 06/18/2008, 06/18/2009, 05/18/2010, 06/17/2010  . Influenza-Unspecified 05/18/2013, 05/23/2014, 06/18/2015  . Pneumococcal Conjugate-13 08/21/2014  . Pneumococcal Polysaccharide-23 08/18/1993, 07/28/2013  . Td 08/18/2005  . Tdap 12/27/2010  . Zoster 08/02/2010   Health Maintenance Due  Topic Date Due  . URINE MICROALBUMIN  08/12/1939  . FOOT EXAM  08/20/2016    Patient Care Team: [name], MD as PCP - General (Internal Medicine) Antonieta Iba, MD as Consulting Physician (Cardiology)  Indicate any recent Medical Services you may have received from other than [facility] providers in the past year (date may be approximate).     Assessment:   This is a routine wellness examination for Brandi [name]. The goal of the wellness visit is to assist the patient how to close the gaps in care and create a preventative care plan for the patient.   The roster of all physicians providing medical  care to patient is listed in the Snapshot section of the chart.  Taking calcium VIT D as appropriate/Osteoporosis reviewed.  Safety issues reviewed; Lives alone at [facility]. Pull chords, smoke and carbon monoxide detectors in the home. No firearms in the home.  Wears seatbelts when driving or riding with others. Patient does wear sunscreen or protective clothing when in direct sunlight. No violence in the home.  Depression- PHQ 2 &9 complete.  No signs/symptoms or verbal communication regarding little pleasure in doing things, feeling down, depressed or hopeless. No changes in sleeping, energy, eating, concentrating.  No thoughts of self harm or harm towards others.  Time spent on this topic is 8 minutes.   Patient is alert, normal appearance, oriented to person/place/time.  Incorrectly identified the president of the [place]; states she has difficulty remembering names.  Recall of 2/3 words, and performing simple calculations. Displays  appropriate judgement and can read correct time from watch face.   No new identified risk were noted.  No failures at ADL's or IADL's.    BMI- discussed the importance of a healthy diet, water intake and the benefits of aerobic exercise.She has a healthy diet, aerobic exercise regimen and plans to increase her water intake.  24 hour diet recall: Regular diet.  Daily fluid intake: 2-3 cups of caffeine, 4 cups of water.   Dental- every 6 months.  Eye- Visual acuity not assessed per patient preference since they have regular follow up with the ophthalmologist.  Wears corrective lenses.  Sleep patterns- Sleeps 6 hours at night.  Wakes feeling rested. Naps during the day as needed.  Patient Concerns: None at this time. Follow up with PCP as needed.  Hearing/Vision screen Hearing Screening Comments: Followed by [facility] every 6 months  Hearing aid, R ear only Deaf in L ear Vision Screening Comments: Followed by [facility] corrective  lenses Last OV 12/2016 Visual acuity not assessed per patient preference since they have regular follow up with the ophthalmologist  Dietary issues and exercise activities discussed: Current Exercise Habits: Home exercise routine, Type of exercise: walking;calisthenics;stretching;Other - see comments(Zumba 60 minutes, 2 days), Time (Minutes): 60, Frequency (Times/Week): 7, Weekly Exercise (Minutes/Week): 420, Intensity: Moderate  Goals    . Increase water intake     Stay hydrated and drink plenty of water/fluids       Depression Screen PHQ 2/9 Scores 09/09/2017 09/05/2016 07/15/2016 09/06/2015 01/31/2014 01/30/2014 07/28/2012  PHQ - 2 Score 0 0 0 0 0 1 0    Fall Risk Fall Risk  09/09/2017 09/05/2016 07/15/2016 09/06/2015 01/31/2014  Falls in the past year? No No No No No   Cognitive Function: MMSE - Mini Mental State Exam 09/05/2016 09/06/2015  Orientation to time 5 5  Orientation to Place 5 5  Registration 3 3  Attention/ Calculation 5 5  Recall 3 3  Language- name 2 objects 2 2  Language- repeat 1 1  Language- follow 3 step command 3 3  Language- read & follow direction 1 1  Write a sentence 1 1  Copy design 1 1  Total score 30 30     6CIT Screen 09/09/2017  What Year? 0 points  What month? 0 points  What time? 0 points  Count back from 20 0 points  Months in reverse 0 points    Screening Tests Health Maintenance  Topic Date Due  . URINE MICROALBUMIN  08/12/1939  . FOOT EXAM  08/20/2016  . HEMOGLOBIN A1C  11/20/2017  . OPHTHALMOLOGY EXAM  12/23/2017  . TETANUS/TDAP  12/26/2020  . INFLUENZA VACCINE  Completed  . DEXA SCAN  Completed  . PNA vac Low Risk Adult  Completed   .ttnurse   Plan:    End of life planning; Advance aging; Advanced directives discussed. Copy of current HCPOA/Living Will requested.    I have personally reviewed and noted the following in the patient's chart:   . Medical and social history . Use of alcohol, tobacco or illicit drugs   . Current medications and supplements . Functional ability and status . Nutritional status . Physical activity . Advanced directives . List of other physicians . Hospitalizations, surgeries, and ER visits in previous 12 months . Vitals . Screenings to include cognitive, depression, and falls . Referrals and appointments  In addition, I have reviewed and discussed with patient certain preventive protocols, quality metrics, and best practice recommendations.  A written personalized care plan for preventive services as well as general preventive health recommendations were provided to patient.     OBrien-Blaney, Titania Gault L, LPN   1/61/0960     I have reviewed the above information and agree with above.   Duncan Dull, MD

## 2017-10-01 ENCOUNTER — Other Ambulatory Visit: Payer: Self-pay | Admitting: Cardiovascular Disease

## 2017-10-31 ENCOUNTER — Other Ambulatory Visit: Payer: Self-pay | Admitting: Cardiovascular Disease

## 2017-11-02 ENCOUNTER — Encounter: Payer: Self-pay | Admitting: Internal Medicine

## 2017-11-02 ENCOUNTER — Ambulatory Visit (INDEPENDENT_AMBULATORY_CARE_PROVIDER_SITE_OTHER): Payer: Medicare Other | Admitting: Internal Medicine

## 2017-11-02 VITALS — BP 104/58 | HR 59 | Temp 97.4°F | Resp 15 | Ht 59.5 in | Wt 111.0 lb

## 2017-11-02 DIAGNOSIS — E782 Mixed hyperlipidemia: Secondary | ICD-10-CM | POA: Diagnosis not present

## 2017-11-02 DIAGNOSIS — M810 Age-related osteoporosis without current pathological fracture: Secondary | ICD-10-CM | POA: Diagnosis not present

## 2017-11-02 DIAGNOSIS — Z0000 Encounter for general adult medical examination without abnormal findings: Secondary | ICD-10-CM | POA: Diagnosis not present

## 2017-11-02 DIAGNOSIS — I272 Pulmonary hypertension, unspecified: Secondary | ICD-10-CM | POA: Diagnosis not present

## 2017-11-02 DIAGNOSIS — R601 Generalized edema: Secondary | ICD-10-CM | POA: Diagnosis not present

## 2017-11-02 NOTE — Patient Instructions (Signed)
I'm glad you are doing so well!  No medication changes were advised today  Return for fasting labs at your leisure  Return in 6 months

## 2017-11-02 NOTE — Assessment & Plan Note (Addendum)
Managed witih amlodipine and furosemide .  She continues to walk daily for exercise without dyspnea.  

## 2017-11-02 NOTE — Progress Notes (Signed)
Patient ID: Brandi [name], female    DOB: 27-Jul-1927  Age: 84 y.o. MRN: [id number]  The patient is here for follow up and  management of other chronic and acute problems.  Health Maintenance:  Has been deferring mammograms since 2014 Up to date on vaccinations     The risk factors are reflected in the social history.  The roster of all physicians providing medical care to patient - is listed in the Snapshot section of the chart.  Activities of daily living:  The patient is 100% independent in all ADLs: dressing, toileting, feeding as well as independent mobility  Home safety : The patient has smoke detectors in the home. They wear seatbelts.  There are no firearms at home. There is no violence in the home.   There is no risks for hepatitis, STDs or HIV. There is no   history of blood transfusion. They have no travel history to infectious disease endemic areas of the world.  The patient has seen their dentist in the last six month. They have seen their eye doctor in the last year. They admit to slight hearing difficulty with regard to whispered voices and some television programs.  They have deferred audiologic testing in the last year.  They do not  have excessive sun exposure. Discussed the need for sun protection: hats, long sleeves and use of sunscreen if there is significant sun exposure.   Diet: the importance of a healthy diet is discussed. They do have a healthy diet.  The benefits of regular aerobic exercise were discussed. She walks 6 days per week inside at [facility] for 30 minutes.   Depression screen: there are no signs or vegative symptoms of depression- irritability, change in appetite, anhedonia, sadness/tearfullness.  Cognitive assessment: the patient manages all their financial and personal affairs and is actively engaged. They could relate day,date,year and events; recalled 2/3 objects at 3 minutes; performed clock-face test normally.  The following portions of the  patient's history were reviewed and updated as appropriate: allergies, current medications, past family history, past medical history,  past surgical history, past social history  and problem list.  Visual acuity was not assessed per patient preference since she has regular follow up with her ophthalmologist. Hearing and body mass index were assessed and reviewed.   During the course of the visit the patient was educated and counseled about appropriate screening and preventive services including : fall prevention , diabetes screening, nutrition counseling, colorectal cancer screening, and recommended immunizations.    CC: The primary encounter diagnosis was Mixed hyperlipidemia. Diagnoses of Pulmonary HTN (HCC), Generalized edema, Encounter for preventive health examination, and Age-related osteoporosis without current pathological fracture were also pertinent to this visit.   She feels generally  well and has no complaints. Taking medications as directed,  Reviewed the purpose of each.   History Brandi [name] has a past medical history of Hyperlipidemia, Mitral regurgitation, and Mitral valve prolapse.   She has a past surgical history that includes Breast biopsy (1980); Hospitalized for endocarditis (1987); and Biopsy breast (1980).   Her family history includes Cancer in her sister; Coronary artery disease in her father; Heart attack in her father; Heart disease in her brother.She reports that  has never smoked. she has never used smokeless tobacco. She reports that she does not drink alcohol or use drugs.  Outpatient Medications Prior to Visit  Medication Sig Dispense Refill  . amLODipine (NORVASC) 5 MG tablet TAKE 1 TABLET BY MOUTH  DAILY 90 tablet  3  . aspirin 81 MG tablet Takes three times a week    . calcitonin, salmon, (MIACALCIN/FORTICAL) 200 UNIT/ACT nasal spray USE 1 SPRAY IN 1 NOSTRIL  DAILY (ALTERNATING NOSTRILS DAILY) 11.1 mL 3  . calcium carbonate (OS-CAL) 600 MG TABS Take 1,200 mg by  mouth daily.    [name]. ezetimibe (ZETIA) 10 MG tablet TAKE 1 TABLET BY MOUTH  DAILY 90 tablet 0  . furosemide (LASIX) 20 MG tablet TAKE 1 TABLET BY MOUTH  DAILY. AS NEEDED FOR FLUID  RETENTION 90 tablet 2  . Multiple Vitamin (MULTIVITAMIN) tablet Take 1 tablet by mouth daily.      . potassium chloride (K-DUR,KLOR-CON) 10 MEQ tablet Take 1 tablet (10 mEq total) by mouth daily. 90 tablet 1  . potassium chloride (K-DUR,KLOR-CON) 10 MEQ tablet TAKE 1 TABLET BY MOUTH  DAILY 90 tablet 2  . famciclovir (FAMVIR) 500 MG tablet Take 1 tablet (500 mg total) by mouth 3 (three) times daily. (Patient not taking: Reported on 11/02/2017) 21 tablet 0   No facility-administered medications prior to visit.     Review of Systems  Objective:  BP (!) 104/58 (BP Location: Left Arm, Patient Position: Sitting, Cuff Size: Normal)   Pulse (!) 59   Temp (!) 97.4 F (36.3 C) (Oral)   Resp 15   Ht 4' 11.5" (1.511 m)   Wt 111 lb (50.3 kg)   SpO2 98%   BMI 22.04 kg/m   Physical Exam    Assessment & Plan:   Problem List Items Addressed This Visit    Edema   Relevant Orders   Comprehensive metabolic panel   TSH   Pulmonary HTN (HCC)    Managed witih amlodipine and furosemide .  She continues to walk daily for exercise without dyspnea.       Osteoporosis    Managed with calcitonin due to history of intolerance to bisphosphonates and SERMs.  History of elbow and wrist  fractures secondary to remote  fall noted.  Encouraged her to increase dietary calcium through natural foods including almond/coconut milk        Hyperlipidemia - Primary    Managed with Zetia due to intolerance of other statins,  With good tolerance.  Return for fasting lab s Lab Results  Component Value Date   CHOL 168 05/22/2017   HDL 64.60 05/22/2017   LDLCALC 86 05/22/2017   LDLDIRECT 91.0 10/31/2016   TRIG 90.0 05/22/2017   CHOLHDL 3 05/22/2017   Lab Results  Component Value Date   ALT 18 05/04/2017   AST 32 05/04/2017    ALKPHOS 71 05/04/2017   BILITOT 0.5 05/04/2017         Relevant Orders   Lipid panel   TSH   Encounter for preventive health examination    Annual comprehensive preventive exam was done as well as an evaluation and management of chronic conditions .  During the course of the visit the patient was offered but deferred  screening and preventive services including :  breast, cervical and colorectal cancer screening given her age.  and recommended immunizations.  She remains physically active, mentally alert and independent,  And has no history of falls.          I have discontinued Brandi [name] famciclovir. I am also having her maintain her multivitamin, aspirin, calcium carbonate, potassium chloride, calcitonin (salmon), ezetimibe, amLODipine, potassium chloride, and furosemide.  No orders of the defined types were placed in this encounter.   Medications Discontinued During  This Encounter  Medication Reason  . famciclovir (FAMVIR) 500 MG tablet     Follow-up: Return in about 6 months (around 05/05/2018) for general follow up.   [name], MD

## 2017-11-03 ENCOUNTER — Other Ambulatory Visit (INDEPENDENT_AMBULATORY_CARE_PROVIDER_SITE_OTHER): Payer: Medicare Other

## 2017-11-03 DIAGNOSIS — R601 Generalized edema: Secondary | ICD-10-CM | POA: Diagnosis not present

## 2017-11-03 DIAGNOSIS — E782 Mixed hyperlipidemia: Secondary | ICD-10-CM

## 2017-11-03 LAB — LIPID PANEL
CHOL/HDL RATIO: 3
CHOLESTEROL: 166 mg/dL (ref 0–200)
HDL: 65.6 mg/dL (ref >39.00)
LDL CALC: 90 mg/dL (ref 0–99)
NonHDL: 100.68
TRIGLYCERIDES: 54 mg/dL (ref 0.0–149.0)
VLDL: 10.8 mg/dL (ref 0.0–40.0)

## 2017-11-03 LAB — COMPREHENSIVE METABOLIC PANEL
ALBUMIN: 3.7 g/dL (ref 3.5–5.2)
ALT: 18 U/L (ref 0–35)
AST: 31 U/L (ref 0–37)
Alkaline Phosphatase: 74 U/L (ref 39–117)
BUN: 19 mg/dL (ref 6–23)
CALCIUM: 10 mg/dL (ref 8.4–10.5)
CHLORIDE: 103 meq/L (ref 96–112)
CO2: 31 meq/L (ref 19–32)
Creatinine, Ser: 0.8 mg/dL (ref 0.40–1.20)
GFR: 71.91 mL/min (ref >60.00)
Glucose, Bld: 90 mg/dL (ref 70–99)
Potassium: 4.1 mEq/L (ref 3.5–5.1)
Sodium: 138 mEq/L (ref 135–145)
Total Bilirubin: 0.5 mg/dL (ref 0.2–1.2)
Total Protein: 6.5 g/dL (ref 6.0–8.3)

## 2017-11-03 LAB — TSH: TSH: 2.19 u[IU]/mL (ref 0.35–4.50)

## 2017-11-03 NOTE — Assessment & Plan Note (Addendum)
Managed with Zetia due to intolerance of other statins,  With good tolerance.  Return for fasting lab s Lab Results  Component Value Date   CHOL 168 05/22/2017   HDL 64.60 05/22/2017   LDLCALC 86 05/22/2017   LDLDIRECT 91.0 10/31/2016   TRIG 90.0 05/22/2017   CHOLHDL 3 05/22/2017   Lab Results  Component Value Date   ALT 18 05/04/2017   AST 32 05/04/2017   ALKPHOS 71 05/04/2017   BILITOT 0.5 05/04/2017

## 2017-11-03 NOTE — Assessment & Plan Note (Addendum)
Annual comprehensive preventive exam was done as well as an evaluation and management of chronic conditions .  During the course of the visit the patient was offered but deferred  screening and preventive services including :  breast, cervical and colorectal cancer screening given her age.  and recommended immunizations.  She remains physically active, mentally alert and independent,  And has no history of falls.

## 2017-11-03 NOTE — Assessment & Plan Note (Signed)
Managed with calcitonin due to history of intolerance to bisphosphonates and SERMs.  History of elbow and wrist  fractures secondary to remote  fall noted.  Encouraged her to increase dietary calcium through natural foods including almond/coconut milk

## 2017-11-17 ENCOUNTER — Encounter: Payer: Self-pay | Admitting: Internal Medicine

## 2017-11-17 ENCOUNTER — Ambulatory Visit
Admission: RE | Admit: 2017-11-17 | Discharge: 2017-11-17 | Disposition: A | Discharge status: Home / Self Care | Payer: Medicare Other | Source: Ambulatory Visit | Attending: Internal Medicine | Admitting: Internal Medicine

## 2017-11-17 ENCOUNTER — Telehealth: Payer: Self-pay

## 2017-11-17 ENCOUNTER — Ambulatory Visit (INDEPENDENT_AMBULATORY_CARE_PROVIDER_SITE_OTHER): Payer: Medicare Other | Admitting: Internal Medicine

## 2017-11-17 ENCOUNTER — Other Ambulatory Visit: Payer: Self-pay | Admitting: Internal Medicine

## 2017-11-17 VITALS — BP 140/60 | HR 59 | Temp 97.7°F | Resp 14 | Ht 59.5 in | Wt 110.0 lb

## 2017-11-17 DIAGNOSIS — M79662 Pain in left lower leg: Secondary | ICD-10-CM | POA: Diagnosis not present

## 2017-11-17 DIAGNOSIS — S8012XA Contusion of left lower leg, initial encounter: Secondary | ICD-10-CM | POA: Diagnosis not present

## 2017-11-17 DIAGNOSIS — M660 Rupture of popliteal cyst: Secondary | ICD-10-CM | POA: Insufficient documentation

## 2017-11-17 DIAGNOSIS — M7989 Other specified soft tissue disorders: Secondary | ICD-10-CM

## 2017-11-17 NOTE — Telephone Encounter (Signed)
Received a call from radiology in regards to pt's US. Radiology stated that the pt is negative for DVT.

## 2017-11-17 NOTE — Assessment & Plan Note (Signed)
With ecchymoses.  DVT ruled out,  Suspect tendon rupture . [facility].  Ice and elevation

## 2017-11-17 NOTE — Patient Instructions (Addendum)
Your calf pain and swelling may be coming from a blood clot ,  A ruptured tendon,  Or a ruptured Baker's Cyst  An ultrasound has been ordered to rule out a blood clot (DVT)   Please elevate your leg and use cool compresses to manage the swelling until we have rule out the DVT    If there is no blood clot,  A  [facility] referral will be made for further evaluation

## 2017-11-17 NOTE — Progress Notes (Signed)
Subjective:  Patient ID: Brandi [name], female    DOB: 03/06/1927  Age: 84 y.o. MRN: [id number]  CC: The primary encounter diagnosis was Pain of left calf. A diagnosis of Calf swelling was also pertinent to this visit.  [name] of leg calf pain and tightness,  Symptoms reported as being present for about 2 weeks .  Not sure when they started.  No history of travel,  [place] .  Left calf feels swollen and tender behind and below the knee.  Multiple bruises noted around ankle and lower calf.    Exam:    Outpatient Medications Prior to Visit  Medication Sig Dispense Refill  . amLODipine (NORVASC) 5 MG tablet TAKE 1 TABLET BY MOUTH  DAILY 90 tablet 3  . aspirin 81 MG tablet Takes three times a week    . calcitonin, salmon, (MIACALCIN/FORTICAL) 200 UNIT/ACT nasal spray USE 1 SPRAY IN 1 NOSTRIL  DAILY (ALTERNATING NOSTRILS DAILY) 11.1 mL 3  . calcium carbonate (OS-CAL) 600 MG TABS Take 1,200 mg by mouth daily.    [name]. ezetimibe (ZETIA) 10 MG tablet TAKE 1 TABLET BY MOUTH  DAILY 90 tablet 0  . furosemide (LASIX) 20 MG tablet TAKE 1 TABLET BY MOUTH  DAILY. AS NEEDED FOR FLUID  RETENTION 90 tablet 2  . Multiple Vitamin (MULTIVITAMIN) tablet Take 1 tablet by mouth daily.      . potassium chloride (K-DUR,KLOR-CON) 10 MEQ tablet TAKE 1 TABLET BY MOUTH  DAILY 90 tablet 2  . potassium chloride (K-DUR,KLOR-CON) 10 MEQ tablet Take 1 tablet (10 mEq total) by mouth daily. 90 tablet 1   No facility-administered medications prior to visit.     Review of Systems;  Patient denies headache, fevers, malaise, unintentional weight loss, skin rash, eye pain, sinus congestion and sinus pain, sore throat, dysphagia,  hemoptysis , cough, dyspnea, wheezing, chest pain, palpitations, orthopnea, edema, abdominal pain, nausea, melena, diarrhea, constipation, flank pain, dysuria, hematuria, urinary  Frequency, nocturia, numbness, tingling, seizures,  Focal weakness, Loss of  consciousness,  Tremor, insomnia, depression, anxiety, and suicidal ideation.      Objective:  BP 140/60 (BP Location: Left Arm, Patient Position: Sitting, Cuff Size: Normal)   Pulse (!) 59   Temp 97.7 F (36.5 C) (Oral)   Resp 14   Ht 4' 11.5" (1.511 [name])   Wt 110 lb (49.9 kg)   SpO2 98%   BMI 21.85 kg/[name]   BP Readings from Last 3 Encounters:  11/17/17 140/60  11/02/17 (!) 104/58  09/09/17 122/62    Wt Readings from Last 3 Encounters:  11/17/17 110 lb (49.9 kg)  11/02/17 111 lb (50.3 kg)  09/09/17 114 lb (51.7 kg)    General appearance: alert, cooperative and appears stated age Ears: normal TM's and external ear canals both ears Throat: lips, mucosa, and tongue normal; teeth and gums normal Neck: no adenopathy, no carotid bruit, supple, symmetrical, trachea midline and thyroid not enlarged, symmetric, no tenderness/mass/nodules Back: symmetric, no curvature. ROM normal. No CVA tenderness. Lungs: clear to auscultation bilaterally Heart: regular rate and rhythm, S1, S2 normal, no murmur, click, rub or gallop Abdomen: soft, non-tender; bowel sounds normal; no masses,  no organomegaly Pulses: 2+ and symmetric Skin: Skin color, texture, turgor normal. No rashes or lesions Ext: Swelling at ankle and calf;  ecchymoses noted on lower calf and around ankle   Lymph nodes: Cervical, supraclavicular, and axillary nodes normal.  Lab Results  Component Value Date  HGBA1C 5.5 05/22/2017    Lab Results  Component Value Date   CREATININE 0.80 11/03/2017   CREATININE 0.84 05/22/2017   CREATININE 0.94 05/04/2017    Lab Results  Component Value Date   WBC 6.1 08/23/2015   HGB 14.1 08/23/2015   HCT 43.0 08/23/2015   PLT 239.0 08/23/2015   GLUCOSE 90 11/03/2017   CHOL 166 11/03/2017   TRIG 54.0 11/03/2017   HDL 65.60 11/03/2017   LDLDIRECT 91.0 10/31/2016   LDLCALC 90 11/03/2017   ALT 18 11/03/2017   AST 31 11/03/2017   NA 138 11/03/2017   K 4.1 11/03/2017   CL 103  11/03/2017   CREATININE 0.80 11/03/2017   BUN 19 11/03/2017   CO2 31 11/03/2017   TSH 2.19 11/03/2017   HGBA1C 5.5 05/22/2017    US Venous Img Lower Unilateral Right  Result Date: 07/15/2016 CLINICAL DATA:  Right lower extremity swelling for 3 week EXAM: RIGHT LOWER EXTREMITY VENOUS DUPLEX ULTRASOUND TECHNIQUE: Doppler venous assessment of the right lower extremity deep venous system was performed, including characterization of spectral flow, compressibility, and phasicity. COMPARISON:  None. FINDINGS: There is complete compressibility of the right common femoral, femoral, and popliteal veins. Doppler analysis demonstrates respiratory phasicity and augmentation of flow with calf compression. No obvious superficial vein or calf vein thrombosis. 4.9 x 0.9 x 2.5 cm right popliteal fossa Baker's cyst. IMPRESSION: No evidence of right lower extremity DVT. Moderate sized right Baker's cyst. Electronically Signed   By: Jolaine Click [name].D.   On: 07/15/2016 14:44    Assessment & Plan:   Problem List Items Addressed This Visit    Calf swelling    With ecchymoses.  DVT ruled out,  Suspect tendon rupture . Refer to [employer]Gustavus Bryant and elevation        Other Visit Diagnoses    Pain of left calf    -  Primary   Relevant Orders   US Venous Img Lower Unilateral Left (Completed)    A total of 25 minutes of face to face time was spent with patient more than half of which was spent in counselling about the above mentioned conditions  and coordination of care   I am having Brandi [place]. Sabedra maintain her multivitamin, aspirin, calcium carbonate, calcitonin (salmon), ezetimibe, amLODipine, potassium chloride, and furosemide.  No orders of the defined types were placed in this encounter.   Medications Discontinued During This Encounter  Medication Reason  . potassium chloride (K-DUR,KLOR-CON) 10 MEQ tablet Duplicate    Follow-up: No follow-ups on file.   [name], MD

## 2017-11-18 NOTE — Telephone Encounter (Signed)
[facility] in progress

## 2017-11-23 DIAGNOSIS — M79662 Pain in left lower leg: Secondary | ICD-10-CM | POA: Diagnosis not present

## 2017-11-23 DIAGNOSIS — M7989 Other specified soft tissue disorders: Secondary | ICD-10-CM | POA: Diagnosis not present

## 2017-12-15 ENCOUNTER — Other Ambulatory Visit: Payer: Self-pay | Admitting: Internal Medicine

## 2017-12-21 DIAGNOSIS — M79662 Pain in left lower leg: Secondary | ICD-10-CM | POA: Diagnosis not present

## 2017-12-21 DIAGNOSIS — H2513 Age-related nuclear cataract, bilateral: Secondary | ICD-10-CM | POA: Diagnosis not present

## 2017-12-21 DIAGNOSIS — M7989 Other specified soft tissue disorders: Secondary | ICD-10-CM | POA: Diagnosis not present

## 2018-03-22 DIAGNOSIS — H90A22 Sensorineural hearing loss, unilateral, left ear, with restricted hearing on the contralateral side: Secondary | ICD-10-CM | POA: Diagnosis not present

## 2018-03-22 DIAGNOSIS — H9120 Sudden idiopathic hearing loss, unspecified ear: Secondary | ICD-10-CM | POA: Diagnosis not present

## 2018-04-05 DIAGNOSIS — H903 Sensorineural hearing loss, bilateral: Secondary | ICD-10-CM | POA: Diagnosis not present

## 2018-05-03 DIAGNOSIS — H90A22 Sensorineural hearing loss, unilateral, left ear, with restricted hearing on the contralateral side: Secondary | ICD-10-CM | POA: Diagnosis not present

## 2018-05-05 ENCOUNTER — Ambulatory Visit (INDEPENDENT_AMBULATORY_CARE_PROVIDER_SITE_OTHER): Payer: Medicare Other | Admitting: Internal Medicine

## 2018-05-05 VITALS — BP 130/54 | HR 52 | Temp 97.8°F | Ht 59.5 in | Wt 108.4 lb

## 2018-05-05 DIAGNOSIS — M810 Age-related osteoporosis without current pathological fracture: Secondary | ICD-10-CM

## 2018-05-05 DIAGNOSIS — M660 Rupture of popliteal cyst: Secondary | ICD-10-CM | POA: Diagnosis not present

## 2018-05-05 DIAGNOSIS — E559 Vitamin D deficiency, unspecified: Secondary | ICD-10-CM

## 2018-05-05 DIAGNOSIS — Z79899 Other long term (current) drug therapy: Secondary | ICD-10-CM

## 2018-05-05 DIAGNOSIS — E782 Mixed hyperlipidemia: Secondary | ICD-10-CM | POA: Diagnosis not present

## 2018-05-05 DIAGNOSIS — I272 Pulmonary hypertension, unspecified: Secondary | ICD-10-CM

## 2018-05-05 DIAGNOSIS — I350 Nonrheumatic aortic (valve) stenosis: Secondary | ICD-10-CM

## 2018-05-05 LAB — COMPREHENSIVE METABOLIC PANEL
ALT: 16 U/L (ref 0–35)
AST: 30 U/L (ref 0–37)
Albumin: 3.7 g/dL (ref 3.5–5.2)
Alkaline Phosphatase: 76 U/L (ref 39–117)
BUN: 23 mg/dL (ref 6–23)
CALCIUM: 9.6 mg/dL (ref 8.4–10.5)
CHLORIDE: 102 meq/L (ref 96–112)
CO2: 35 meq/L — AB (ref 19–32)
CREATININE: 0.88 mg/dL (ref 0.40–1.20)
GFR: 64.35 mL/min (ref >60.00)
Glucose, Bld: 92 mg/dL (ref 70–99)
POTASSIUM: 3.9 meq/L (ref 3.5–5.1)
Sodium: 139 mEq/L (ref 135–145)
Total Bilirubin: 0.4 mg/dL (ref 0.2–1.2)
Total Protein: 6.6 g/dL (ref 6.0–8.3)

## 2018-05-05 LAB — VITAMIN D 25 HYDROXY (VIT D DEFICIENCY, FRACTURES): VITD: 47.4 ng/mL (ref 30.00–100.00)

## 2018-05-05 LAB — LIPID PANEL
CHOL/HDL RATIO: 3
CHOLESTEROL: 175 mg/dL (ref 0–200)
HDL: 59.8 mg/dL (ref >39.00)
LDL CALC: 93 mg/dL (ref 0–99)
NonHDL: 114.89
TRIGLYCERIDES: 108 mg/dL (ref 0.0–149.0)
VLDL: 21.6 mg/dL (ref 0.0–40.0)

## 2018-05-05 NOTE — Progress Notes (Signed)
Subjective:  Patient ID: Brandi [name], female    DOB: 1926/10/20  Age: 84 y.o. MRN: [id number]  CC: The primary encounter diagnosis was Age-related osteoporosis without current pathological fracture. Diagnoses of Vitamin D deficiency, Mixed hyperlipidemia, Long-term current use of high risk medication other than anticoagulant, Nonrheumatic aortic valve stenosis, Ruptured Bakers cyst, and Pulmonary HTN (HCC) were also pertinent to this visit.  HPI Brandi [name] [facility] presents for follow up on hypertension , hyperlipidmeia  Cc:  Hearing loss right ear .  Gradual,  Has already  Lost the hearing in left ear remotely. Having trouble with one on one conversations ,  Phone and TV OK . [name] Still driving,  No problem hearing ambulance and other sirens.  Drives only during the day  Already hears a hearing aid in right ear   Was given empiric prednisone  x 2 weeks by ENT Willeen Cass with no significant change.   treated for ruptured BAker's Cyst left lower extremity.    Hypertension: patient checks blood pressure twice weekly at home.  Readings have been for the most part < 140/80 at rest . Patient is following a reduce salt diet most days and is taking medications as prescribed  Moderate to severe Mitral valve regurgitation, asymptomatic .  Last ECHO march 2018  [place]  follow up appt  November    Osteoporosis  Last DEX 2013  T SCORES -3.6  TAKES  CALCITIONIN,  INTOLERANT OF ALENDROANTE DUE TO ESOPHAGITIS IN THE PAST   NO DEXA SINCE 2013   ATE HAM AND CHEESE FOR [facility] TODAY AT 11:30   Outpatient Medications Prior to Visit  Medication Sig Dispense Refill  . amLODipine (NORVASC) 5 MG tablet TAKE 1 TABLET BY MOUTH  DAILY 90 tablet 3  . aspirin 81 MG tablet Takes three times a week    . calcitonin, salmon, (MIACALCIN/FORTICAL) 200 UNIT/ACT nasal spray USE 1 SPRAY IN 1 NOSTRIL  DAILY (ALTERNATING NOSTRILS DAILY) 11.1 mL 3  . calcium carbonate (OS-CAL) 600 MG TABS Take 1,200 mg by mouth daily.    [name] ezetimibe (ZETIA)  10 MG tablet TAKE 1 TABLET BY MOUTH  DAILY 90 tablet 1  . furosemide (LASIX) 20 MG tablet TAKE 1 TABLET BY MOUTH  DAILY. AS NEEDED FOR FLUID  RETENTION 90 tablet 2  . Multiple Vitamin (MULTIVITAMIN) tablet Take 1 tablet by mouth daily.      . potassium chloride (K-DUR,KLOR-CON) 10 MEQ tablet TAKE 1 TABLET BY MOUTH  DAILY 90 tablet 2   No facility-administered medications prior to visit.     Review of Systems;  Patient denies headache, fevers, malaise, unintentional weight loss, skin rash, eye pain, sinus congestion and sinus pain, sore throat, dysphagia,  hemoptysis , cough, dyspnea, wheezing, chest pain, palpitations, orthopnea, edema, abdominal pain, nausea, melena, diarrhea, constipation, flank pain, dysuria, hematuria, urinary  Frequency, nocturia, numbness, tingling, seizures,  Focal weakness, Loss of consciousness,  Tremor, insomnia, depression, anxiety, and suicidal ideation.      Objective:  BP (!) 130/54   Pulse (!) 52   Temp 97.8 F (36.6 C) (Oral)   Ht 4' 11.5" (1.511 m)   Wt 108 lb 6.4 oz (49.2 kg)   SpO2 97%   BMI 21.53 kg/m   BP Readings from Last 3 Encounters:  05/05/18 (!) 130/54  11/17/17 140/60  11/02/17 (!) 104/58    Wt Readings from Last 3 Encounters:  05/05/18 108 lb 6.4 oz (49.2 kg)  11/17/17 110 lb (49.9 kg)  11/02/17 111  lb (50.3 kg)    General appearance: alert, cooperative and appears stated age Ears: normal TM's and external ear canals both ears Throat: lips, mucosa, and tongue normal; teeth and gums normal Neck: no adenopathy, no carotid bruit, supple, symmetrical, trachea midline and thyroid not enlarged, symmetric, no tenderness/mass/nodules Back: symmetric, no curvature. ROM normal. No CVA tenderness. Lungs: clear to auscultation bilaterally Heart: regular rate and rhythm, S1, S2 normal, no murmur, click, rub or gallop Abdomen: soft, non-tender; bowel sounds normal; no masses,  no organomegaly Pulses: 2+ and symmetric Skin: Skin color,  texture, turgor normal. No rashes or lesions Lymph nodes: Cervical, supraclavicular, and axillary nodes normal.  Lab Results  Component Value Date   HGBA1C 5.5 05/22/2017    Lab Results  Component Value Date   CREATININE 0.88 05/05/2018   CREATININE 0.80 11/03/2017   CREATININE 0.84 05/22/2017    Lab Results  Component Value Date   WBC 6.1 08/23/2015   HGB 14.1 08/23/2015   HCT 43.0 08/23/2015   PLT 239.0 08/23/2015   GLUCOSE 92 05/05/2018   CHOL 175 05/05/2018   TRIG 108.0 05/05/2018   HDL 59.80 05/05/2018   LDLDIRECT 91.0 10/31/2016   LDLCALC 93 05/05/2018   ALT 16 05/05/2018   AST 30 05/05/2018   NA 139 05/05/2018   K 3.9 05/05/2018   CL 102 05/05/2018   CREATININE 0.88 05/05/2018   BUN 23 05/05/2018   CO2 35 (H) 05/05/2018   TSH 2.19 11/03/2017   HGBA1C 5.5 05/22/2017    US Venous Img Lower Unilateral Left  Result Date: 11/17/2017 CLINICAL DATA:  Left calf swelling and bruising x2 weeks EXAM: LEFT LOWER EXTREMITY VENOUS DOPPLER ULTRASOUND TECHNIQUE: Gray-scale sonography with compression, as well as color and duplex ultrasound, were performed to evaluate the deep venous system from the level of the common femoral vein through the popliteal and proximal calf veins. COMPARISON:  None FINDINGS: Normal compressibility of the common femoral, superficial femoral, and popliteal veins, as well as the proximal calf veins. No filling defects to suggest DVT on grayscale or color Doppler imaging. Doppler waveforms show normal direction of venous flow, normal respiratory phasicity and response to augmentation. Survey views of the contralateral common femoral vein are unremarkable. Subcutaneous edema at the lateral ankle level. IMPRESSION: No evidence of LEFT lower extremity deep vein thrombosis. Electronically Signed   By: Corlis Leak M.D.   On: 11/17/2017 15:42    Assessment & Plan:   Problem List Items Addressed This Visit    Aortic valve stenosis    Moderate,  With mitral  valve regurgitation   Severe tricuspid regurgitation, bilateral enlargement and pulmonary hypertension by last  ECHO March 2018 .  She remains aymptomatic      Hyperlipidemia    Managed with Zetia due to intolerance of other statins,  With good tolerance and good reduction in LDL .   Lab Results  Component Value Date   CHOL 175 05/05/2018   HDL 59.80 05/05/2018   LDLCALC 93 05/05/2018   LDLDIRECT 91.0 10/31/2016   TRIG 108.0 05/05/2018   CHOLHDL 3 05/05/2018   Lab Results  Component Value Date   ALT 16 05/05/2018   AST 30 05/05/2018   ALKPHOS 76 05/05/2018   BILITOT 0.4 05/05/2018         Relevant Orders   Lipid panel (Completed)   Long-term current use of high risk medication other than anticoagulant   Relevant Orders   Comprehensive metabolic panel (Completed)   Osteoporosis - Primary  Relevant Orders   DG Bone Density   Pulmonary HTN (HCC)    Managed witih amlodipine and furosemide .  She continues to walk daily for exercise without dyspnea.       Ruptured Bakers cyst     ruptured Baker's Cyst confirmed by  Ultrasound and [facility] with compression [facility] .        Other Visit Diagnoses    Vitamin D deficiency       Relevant Orders   VITAMIN D 25 Hydroxy (Vit-D Deficiency, Fractures) (Completed)      I am having Lillia DallasJean M. Lohmann maintain her multivitamin, aspirin, calcium carbonate, calcitonin (salmon), amLODipine, potassium chloride, furosemide, and ezetimibe.  No orders of the defined types were placed in this encounter.   There are no discontinued medications.  Follow-up: Return in about 6 months (around 11/03/2018).   Sherlene Shamseresa L Jagdeep Ancheta, MD

## 2018-05-05 NOTE — Progress Notes (Signed)
Pre visit review using our clinic review tool, if applicable. No additional management support is needed unless otherwise documented below in the visit note. 

## 2018-05-05 NOTE — Patient Instructions (Addendum)
I'm sorry about your hearing loss.  I know this must be frustrating    I have ordered a DEXA scan to evaluate your bones  If the bones are  Still very thin,  We will try to get Prolia authorized

## 2018-05-08 ENCOUNTER — Encounter: Payer: Self-pay | Admitting: Internal Medicine

## 2018-05-08 NOTE — Assessment & Plan Note (Signed)
Managed witih amlodipine and furosemide .  She continues to walk daily for exercise without dyspnea.

## 2018-05-08 NOTE — Assessment & Plan Note (Addendum)
ruptured Baker's Cyst confirmed by  Ultrasound and managed with compression by Kubinski  [facility]. .[name]

## 2018-05-08 NOTE — Assessment & Plan Note (Signed)
Managed with Zetia due to intolerance of other statins,  With good tolerance and good reduction in LDL .   Lab Results  Component Value Date   CHOL 175 05/05/2018   HDL 59.80 05/05/2018   LDLCALC 93 05/05/2018   LDLDIRECT 91.0 10/31/2016   TRIG 108.0 05/05/2018   CHOLHDL 3 05/05/2018   Lab Results  Component Value Date   ALT 16 05/05/2018   AST 30 05/05/2018   ALKPHOS 76 05/05/2018   BILITOT 0.4 05/05/2018

## 2018-05-08 NOTE — Assessment & Plan Note (Signed)
Moderate,  With mitral valve regurgitation   Severe tricuspid regurgitation, bilateral enlargement and pulmonary hypertension by last  ECHO March 2018 .  She remains aymptomatic

## 2018-06-08 ENCOUNTER — Ambulatory Visit
Admission: RE | Admit: 2018-06-08 | Discharge: 2018-06-08 | Disposition: A | Discharge status: Home / Self Care | Payer: Medicare Other | Source: Ambulatory Visit | Attending: Internal Medicine | Admitting: Internal Medicine

## 2018-06-08 DIAGNOSIS — M810 Age-related osteoporosis without current pathological fracture: Secondary | ICD-10-CM | POA: Diagnosis not present

## 2018-06-09 ENCOUNTER — Encounter: Payer: Self-pay | Admitting: *Deleted

## 2018-06-10 ENCOUNTER — Telehealth: Payer: Self-pay | Admitting: *Deleted

## 2018-06-10 NOTE — Telephone Encounter (Signed)
See result note message. Pt is aware of results.

## 2018-06-10 NOTE — Telephone Encounter (Signed)
Copied from CRM [id number]. Topic: Quick Communication - Other Results (Clinic Use ONLY) >> Jun 10, 2018  8:17 AM Leafy Ro wrote: Pt would like bone density results. Pt had test at [facility]

## 2018-06-16 NOTE — Telephone Encounter (Signed)
Patient wants to know whether she needs to order more medication for her bones or whether she will be getting a different medication going foeward? She currently is not taking anything for her osteo needs.

## 2018-06-16 NOTE — Telephone Encounter (Signed)
PER MY MESSAGE,  SHE WOULD BENEFIT FROM PROLIA.  WE WILL NEED TO GET PA FOR PROLIA

## 2018-06-17 NOTE — Telephone Encounter (Signed)
Spoke with pt and she is aware that this has to get approved through her insurance first. Also let her know that once it gets approved we will contact her to let her know and get an appt scheduled. Pt gave a verbal understanding.

## 2018-06-28 NOTE — Telephone Encounter (Signed)
PA for prolia submitted on [employer] portal.

## 2018-07-12 NOTE — Telephone Encounter (Signed)
Pt would like a call back concerning her prolia injection. Pt would like a call back asap.

## 2018-07-12 NOTE — Telephone Encounter (Signed)
Left detailed message for patient as to receiving approval for prolia. Need nurse visit.

## 2018-07-14 NOTE — Telephone Encounter (Signed)
Left second message for patient to return call to office. 

## 2018-07-18 NOTE — Progress Notes (Signed)
Cardiology Office Note  Date:  07/19/2018   ID:  Brandi [name], DOB Feb 11, 1927, MRN [id number]  PCP:  [name], [name], MD   Chief Complaint  Patient presents with  . Other    Past due 12 month follow up. Patient denies chest pain and SOB at this time. Patient was last seen 05/11/17. Meds reviewed verbally with patient.     HPI:  84 year old woman with  anterior leaflet MVP and moderate to severe MR, in 2018 Mild to moderate  aortic valve stenosis Moderate to severe TR, RVSP 54 in 2018 Hyperlipidemia [facility] who presents for routine followup of her mitral and aortic valve disease.  In follow-up today reports that she is doing well, no complaints Wears compression hose, open toe Swelling stable Continues her walking daily, 1 mile a day at 6 Am zumba several times a week  On lasix and potassium  daily, denies any shortness of breath Unless weight is low, then holds lasix  Denies any significant shortness of breath, chest pressure No PND orthopnea  Previously declined further evaluation or surgery of her mitral valve  Tolerating zetia.   EKG personally reviewed by myself on todays visit Shows normal sinus rhythm rate 63 bpm home Bpm T-wave abnormality in lead 3 and aVF  Other past medical history reviewed Echo 10/23/2016 Reviewed with her in detail, images pulled up in the office for her to review Normal size LV   The estimated ejection fraction was in the range of 60% to 65%. Aortic valve:  mild stenosis: - Mitral valve: Moderate myxomatous degeneration. Prolapse noted of anterior mitral valve leaflet. There was moderate to severe regurgitation. - Left atrium: The atrium was severely dilated. - Right atrium: The atrium was moderately dilated. - Tricuspid valve: There was moderate-severe regurgitation. - Pulmonary arteries: Systolic pressure was moderately increased.   PA peak pressure: 54 mm Hg (S).  lost her husband. He had a long history of severe  pulmonary hypertension, chronic diastolic CHF. mother passed away at age 82. father did pass away at age 82 from a large MI, others in her family have lived into their 6790s.  2-D echocardiogram May 08, 2009 showed normal left ventricular systolic function. Bileaflet mitral valve prolapse with moderate mitral regurgitation.    PMH:   has a past medical history of Hyperlipidemia, Mitral regurgitation, and Mitral valve prolapse.  PSH:    Past Surgical History:  Procedure Laterality Date  . BIOPSY BREAST  1980   left ,  benign  . BREAST BIOPSY  1980   normal  . Hospitalized for endocarditis  1987    Current Outpatient Medications  Medication Sig Dispense Refill  . amLODipine (NORVASC) 5 MG tablet TAKE 1 TABLET BY MOUTH  DAILY 90 tablet 3  . aspirin 81 MG tablet Takes three times a week    . calcium carbonate (OS-CAL) 600 MG TABS Take 1,200 mg by mouth daily.    [name]. ezetimibe (ZETIA) 10 MG tablet TAKE 1 TABLET BY MOUTH  DAILY 90 tablet 1  . furosemide (LASIX) 20 MG tablet TAKE 1 TABLET BY MOUTH  DAILY. AS NEEDED FOR FLUID  RETENTION 90 tablet 2  . Multiple Vitamin (MULTIVITAMIN) tablet Take 1 tablet by mouth daily.      . potassium chloride (K-DUR,KLOR-CON) 10 MEQ tablet TAKE 1 TABLET BY MOUTH  DAILY 90 tablet 2   No current facility-administered medications for this visit.      Allergies:   Codeine; Statins;  and Sulfonamide derivatives   Social History:  The patient  reports that she has never smoked. She has never used smokeless tobacco. She reports that she does not drink alcohol or use drugs.   Family History:   family history includes Cancer in her sister; Coronary artery disease in her father; Heart attack in her father; Heart disease in her brother.    Review of Systems: Review of Systems  Constitutional: Negative.   Respiratory: Negative.   Cardiovascular: Negative.   Gastrointestinal: Negative.   Musculoskeletal: Negative.   Neurological: Negative.    Psychiatric/Behavioral: Negative.   All other systems reviewed and are negative.    PHYSICAL EXAM: VS:  BP 138/66 (BP Location: Left Arm, Patient Position: Sitting, Cuff Size: Normal)   Pulse 63   Ht 4\' 11"  (1.499 m)   Wt 109 lb 12 oz (49.8 kg)   BMI 22.17 kg/m  , BMI Body mass index is 22.17 kg/m. Constitutional:  oriented to person, place, and time. No distress.  HENT:  Head: Grossly normal Eyes:  no discharge. No scleral icterus.  Neck: No JVD, no carotid bruits  Cardiovascular: Regular rate and rhythm, 2-3/6 systolic ejection murmur appreciated right sternal border, left sternal border radiating into the left axilla Significant systolic murmurs appreciated Pulmonary/Chest: Clear to auscultation bilaterally, no wheezes or rails Abdominal: Soft.  no distension.  no tenderness.  Musculoskeletal: Normal range of motion Neurological:  normal muscle tone. Coordination normal. No atrophy Skin: Skin warm and dry Psychiatric: normal affect, pleasant   Recent Labs: 11/03/2017: TSH 2.19 05/05/2018: ALT 16; BUN 23; Creatinine, Ser 0.88; Potassium 3.9; Sodium 139    Lipid Panel Lab Results  Component Value Date   CHOL 175 05/05/2018   HDL 59.80 05/05/2018   LDLCALC 93 05/05/2018   TRIG 108.0 05/05/2018      Wt Readings from Last 3 Encounters:  07/19/18 109 lb 12 oz (49.8 kg)  05/05/18 108 lb 6.4 oz (49.2 kg)  11/17/17 110 lb (49.9 kg)       ASSESSMENT AND PLAN:  Mixed hyperlipidemia Tolerating Zetia  Chronic venous insufficiency Wearing compression hose, denies any significant edema  MITRAL REGURGITATION Mitral valve prolapse Normal LV size and function on echocardiogram March 2018 Asymptomatic Moderate to severe MR, does not want any surgery at this time We have ordered repeat echocardiogram  Aortic valve stenosis, etiology of cardiac valve disease unspecified Mild aortic valve stenosis by velocity and mean gradient 2018 Reports she is asymptomatic, repeat  echocardiogram ordered  Pulmonary HTN Good exercise tolerance Tolerating Lasix with potassium She does not want any intervention, repeat echocardiogram ordered  Disposition:   F/U  12 months   Total encounter time more than 25 minutes  Greater than 50% was spent in counseling and coordination of care with the patient    Orders Placed This Encounter  Procedures  . EKG 12-Lead     Signed, Dossie Arbour, M.D., Ph.D. 07/19/2018  [facility] [place], [place] [phone]

## 2018-07-19 ENCOUNTER — Ambulatory Visit (INDEPENDENT_AMBULATORY_CARE_PROVIDER_SITE_OTHER): Payer: Medicare Other | Admitting: Cardiovascular Disease

## 2018-07-19 ENCOUNTER — Encounter: Payer: Self-pay | Admitting: Cardiovascular Disease

## 2018-07-19 ENCOUNTER — Encounter

## 2018-07-19 VITALS — BP 138/66 | HR 63 | Ht 59.0 in | Wt 109.8 lb

## 2018-07-19 DIAGNOSIS — I080 Rheumatic disorders of both mitral and aortic valves: Secondary | ICD-10-CM

## 2018-07-19 DIAGNOSIS — E782 Mixed hyperlipidemia: Secondary | ICD-10-CM | POA: Diagnosis not present

## 2018-07-19 DIAGNOSIS — I350 Nonrheumatic aortic (valve) stenosis: Secondary | ICD-10-CM

## 2018-07-19 DIAGNOSIS — I272 Pulmonary hypertension, unspecified: Secondary | ICD-10-CM | POA: Diagnosis not present

## 2018-07-19 DIAGNOSIS — I872 Venous insufficiency (chronic) (peripheral): Secondary | ICD-10-CM

## 2018-07-19 NOTE — Patient Instructions (Signed)
Medication Instructions:  No changes  If you need a refill on your cardiac medications before your next appointment, please call your pharmacy.    Lab work: No new labs needed   If you have labs (blood work) drawn today and your tests are completely normal, you will receive your results only by: [name]. MyChart Message (if you have MyChart) OR . A paper copy in the mail If you have any lab test that is abnormal or we need to change your treatment, we will call you to review the results.   Testing/Procedures:  We will schedule an echocardiogram for mitral and aortic valve disease   Follow-Up: At [facility], you and your health needs are our priority.  As part of our continuing mission to provide you with exceptional heart care, we have created designated Provider Care Teams.  These Care Teams include your primary Cardiologist (physician) and Advanced Practice Providers (APPs -  Physician Assistants and Nurse Practitioners) who all work together to provide you with the care you need, when you need it.  . You will need a follow up appointment in 12 months .   Please call our office 2 months in advance to schedule this appointment.    . Providers on your designated Care Team:   . Nicolasa Duckinghristopher Berge, NP . Eula Listenyan Dunn, PA-C . Marisue IvanJacquelyn Visser, PA-C  Any Other Special Instructions Will Be Listed Below (If Applicable).  For educational health videos Log in to : [email or web address] Or : [email or web address], password : [email or web address]

## 2018-07-20 ENCOUNTER — Other Ambulatory Visit: Payer: Self-pay

## 2018-07-20 ENCOUNTER — Ambulatory Visit (INDEPENDENT_AMBULATORY_CARE_PROVIDER_SITE_OTHER): Payer: Medicare Other

## 2018-07-20 DIAGNOSIS — I350 Nonrheumatic aortic (valve) stenosis: Secondary | ICD-10-CM

## 2018-07-20 DIAGNOSIS — I080 Rheumatic disorders of both mitral and aortic valves: Secondary | ICD-10-CM

## 2018-07-21 ENCOUNTER — Ambulatory Visit (INDEPENDENT_AMBULATORY_CARE_PROVIDER_SITE_OTHER): Payer: Medicare Other | Admitting: *Deleted

## 2018-07-21 DIAGNOSIS — M810 Age-related osteoporosis without current pathological fracture: Secondary | ICD-10-CM | POA: Diagnosis not present

## 2018-07-21 MED ORDER — DENOSUMAB 60 MG/ML ~~LOC~~ SOSY
60.0000 mg | PREFILLED_SYRINGE | Freq: Once | SUBCUTANEOUS | Status: AC
  Administered 2018-07-21: 60 mg via SUBCUTANEOUS

## 2018-07-21 NOTE — Progress Notes (Signed)
Patient presented for Prolia injection to Right arm [place], patient voiced no concerns or complaints during or after injection. 

## 2018-07-22 ENCOUNTER — Telehealth: Payer: Self-pay | Admitting: Cardiovascular Disease

## 2018-07-22 NOTE — Telephone Encounter (Signed)
Patient calling to discuss recent  Echo testing results   Please call   

## 2018-07-22 NOTE — Telephone Encounter (Signed)
Spoke with patient and reviewed that echocardiogram is pending provider review and once we receive those results we will give her a call back. She was appreciative for the call with no further questions at this time.

## 2018-07-26 ENCOUNTER — Other Ambulatory Visit: Payer: Self-pay | Admitting: Internal Medicine

## 2018-07-27 NOTE — Telephone Encounter (Signed)
Echocardiogram result Improved compared to March 2018 Improved in that right heart pressures have dropped from 54 down into the low 40s Lasix may have played a role in dropping pressures Otherwise still with moderate to severe leaky mitral valve Normal cardiac function Mild aortic valve stenosis unchanged Overall mildly improved compared to March 2018

## 2018-07-27 NOTE — Telephone Encounter (Signed)
Spoke with patient and reviewed echocardiogram results in detail with her. She verbalized understanding with no further questions at this time.

## 2018-08-20 ENCOUNTER — Other Ambulatory Visit: Payer: Self-pay | Admitting: Cardiovascular Disease

## 2018-09-10 ENCOUNTER — Ambulatory Visit (INDEPENDENT_AMBULATORY_CARE_PROVIDER_SITE_OTHER): Payer: Medicare Other

## 2018-09-10 VITALS — BP 120/62 | HR 71 | Temp 97.8°F | Resp 14 | Ht 59.0 in | Wt 109.4 lb

## 2018-09-10 DIAGNOSIS — Z0000 Encounter for general adult medical examination without abnormal findings: Secondary | ICD-10-CM | POA: Diagnosis not present

## 2018-09-10 NOTE — Progress Notes (Addendum)
Subjective:   Brandi [name] is a 85 y.o. female who presents for Medicare Annual (Subsequent) preventive examination.  Review of Systems:  No ROS.  Medicare Wellness Visit. Additional risk factors are reflected in the social history. Cardiac Risk Factors include: advanced age (>1355men, 58>65 women)     Objective:     Vitals: BP 120/62 (BP Location: Left Arm, Patient Position: Sitting, Cuff Size: Normal)   Pulse 71   Temp 97.8 F (36.6 C) (Oral)   Resp 14   Ht 4\' 11"  (1.499 m)   Wt 109 lb 6.4 oz (49.6 kg)   SpO2 98%   BMI 22.10 kg/m   Body mass index is 22.1 kg/m.  Advanced Directives 09/10/2018 09/09/2017 09/05/2016 09/06/2015  Does Patient Have a Medical Advance Directive? Yes Yes Yes Yes  Type of Estate agentAdvance Directive Healthcare Power of [place];Living will Healthcare Power of [place];Living will Living will;Healthcare Power of State Street Corporationttorney Healthcare Power of [place]orney;Living will  Does patient want to make changes to medical advance directive? No - Patient declined No - Patient declined No - Patient declined No - Patient declined  Copy of Healthcare Power of Attorney in Chart? No - copy requested No - copy requested No - copy requested No - copy requested    Tobacco Social History   Tobacco Use  Smoking Status Never Smoker  Smokeless Tobacco Never Used     Counseling given: Not Answered   Clinical Intake:  Pre-visit preparation completed: Yes  Pain : No/denies pain     Diabetes: No  How often do you need to have someone help you when you read instructions, pamphlets, or other written materials from your doctor or pharmacy?: 1 - Never  Interpreter Needed?: No     Past Medical History:  Diagnosis Date  . Hyperlipidemia   . Mitral regurgitation   . Mitral valve prolapse    Past Surgical History:  Procedure Laterality Date  . BIOPSY BREAST  1980   left ,  benign  . BREAST BIOPSY  1980   normal  . Hospitalized for endocarditis  1987   Family History    Problem Relation Age of Onset  . Heart attack Father   . Coronary artery disease Father   . Cancer Sister        esophageal  . Heart disease Brother    Social History   Socioeconomic History  . Marital status: Widowed    Spouse name: Not on file  . Number of children: 0  . Years of education: Not on file  . Highest education level: Not on file  Occupational History  . Occupation: retired  Engineer, productionocial Needs  . Financial resource strain: Not hard at all  . Food insecurity:    Worry: Never true    Inability: Never true  . Transportation needs:    Medical: No    Non-medical: No  Tobacco Use  . Smoking status: Never Smoker  . Smokeless tobacco: Never Used  Substance and Sexual Activity  . Alcohol use: No  . Drug use: No  . Sexual activity: Not Currently  Lifestyle  . Physical activity:    Days per week: 5 days    Minutes per session: 60 min  . Stress: Not at all  Relationships  . Social connections:    Talks on phone: Not on file    Gets together: Not on file    Attends religious service: Not on file    Active member of club or organization: Not on  file    Attends meetings of clubs or organizations: Not on file    Relationship status: Not on file  Other Topics Concern  . Not on file  Social History Narrative   Lives [place]   Exercise, walks 7 days a week with aerobic exercise 2 days a week       Outpatient Encounter Medications as of 09/10/2018  Medication Sig  . amLODipine (NORVASC) 5 MG tablet TAKE 1 TABLET BY MOUTH  DAILY  . aspirin 81 MG tablet Takes three times a week  . ezetimibe (ZETIA) 10 MG tablet TAKE 1 TABLET BY MOUTH  DAILY  . furosemide (LASIX) 20 MG tablet TAKE 1 TABLET BY MOUTH  DAILY. AS NEEDED FOR FLUID  RETENTION  . Multiple Vitamin (MULTIVITAMIN) tablet Take 1 tablet by mouth daily.    . potassium chloride (K-DUR,KLOR-CON) 10 MEQ tablet TAKE 1 TABLET BY MOUTH  DAILY  . [DISCONTINUED] calcium carbonate (OS-CAL) 600 MG TABS Take 1,200 mg by  mouth daily.   No facility-administered encounter medications on file as of 09/10/2018.     Activities of Daily Living In your present state of health, do you have any difficulty performing the following activities: 09/10/2018  Hearing? Y  Vision? N  Difficulty concentrating or making decisions? N  Walking or climbing stairs? N  Dressing or bathing? N  Doing errands, shopping? N  Preparing Food and eating ? N  Using the Toilet? N  In the past six months, have you accidently leaked urine? N  Do you have problems with loss of bowel control? N  Managing your Medications? N  Managing your Finances? N  Housekeeping or managing your Housekeeping? N  Some recent data might be hidden    Patient Care Team: [name], MD as PCP - General (Internal Medicine) Antonieta Iba, MD as Consulting Physician (Cardiology)    Assessment:   This is a routine wellness examination for Brandi [name].  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  Hemoglobin A1C -05/22/17 (5.5) Cholesterol -05/05/18 (175)  Social  Alcohol intake -no Smoking history- -no Smokers in home? none Illicit drug use? none Exercise walking Diet -regular Sexually Active -never  Safety  Patient feels safe at home.  Patient does have smoke detectors at home  Patient does wear sunscreen or protective clothing when in direct sunlight  Patient does wear seat belt when driving or riding with others.   Activities of Daily Living Patient can do their own household chores. Denies needing assistance with: driving, feeding themselves, getting from bed to chair, getting to the toilet, bathing/showering, dressing, managing money, climbing flight of stairs, or preparing meals.   Depression Screen Patient denies losing interest in daily life, feeling hopeless, or crying easily over simple problems.   Fall Screen Patient denies being  afraid of falling or falling in the last year.   Memory Screen Patient denies problems with memory, misplacing items, and is able to balance checkbook/bank accounts.  Patient is alert, normal appearance, oriented to person/place/and time. Correctly identified the president of the [place], recall of 3/3 objects, and performing simple calculations.  Patient displays appropriate judgement and can read correct time from watch face.   Immunizations The following Immunizations are up to date: Shingles, pneumonia, and tetanus.  Influenza discussed; she recalls having it done in this office, not [facility]. No record on file.   Other Providers Patient Care Team: Darrick Huntsman,  Mar Daringeresa L, MD as PCP - General (Internal Medicine) Antonieta IbaGollan, Timothy J, MD as Consulting Physician (Cardiology)  Exercise Activities and Dietary recommendations Current Exercise Habits: Home exercise routine, Type of exercise: walking, Time (Minutes): 60, Frequency (Times/Week): 5, Weekly Exercise (Minutes/Week): 300, Intensity: Moderate  Goals    . Maintain Healthy Lifestyle     Stay active with walking Healthy diet Stay hydrated Continue with brain engaging activities       Fall Risk Fall Risk  09/10/2018 05/05/2018 09/09/2017 09/05/2016 07/15/2016  Falls in the past year? 0 No No No No    Depression Screen PHQ 2/9 Scores 09/10/2018 05/05/2018 09/09/2017 09/05/2016  PHQ - 2 Score 0 0 0 0     Cognitive Function MMSE - Mini Mental State Exam 09/05/2016 09/06/2015  Orientation to time 5 5  Orientation to Place 5 5  Registration 3 3  Attention/ Calculation 5 5  Recall 3 3  Language- name 2 objects 2 2  Language- repeat 1 1  Language- follow 3 step command 3 3  Language- read & follow direction 1 1  Write a sentence 1 1  Copy design 1 1  Total score 30 30     6CIT Screen 09/10/2018 09/09/2017  What Year? 0 points 0 points  What month? 0 points 0 points  What time? 0 points 0 points  Count back from 20 0 points 0 points    Months in reverse 0 points 0 points  Repeat phrase 0 points -  Total Score 0 -    Immunization History  Administered Date(s) Administered  . 19-influenza Whole 06/11/2017  . H1N1 10/12/2008  . Influenza Split 06/17/2011  . Influenza Whole 06/18/2008, 06/18/2009, 05/18/2010, 06/17/2010  . Influenza-Unspecified 05/18/2013, 05/23/2014, 06/18/2015  . Pneumococcal Conjugate-13 08/21/2014  . Pneumococcal Polysaccharide-23 08/18/1993, 07/28/2013  . Td 08/18/2005  . Tdap 12/27/2010  . Zoster 08/02/2010  . Zoster Recombinat (Shingrix) 09/21/2017, 06/16/2018    Screening Tests Health Maintenance  Topic Date Due  . URINE MICROALBUMIN  08/12/1937  . OPHTHALMOLOGY EXAM  12/23/2017  . INFLUENZA VACCINE  03/18/2018  . TETANUS/TDAP  12/26/2020  . DEXA SCAN  Completed  . PNA vac Low Risk Adult  Completed       Plan:    End of life planning; Advance aging; Advanced directives discussed. Copy of current HCPOA/Living Will requested.    I have personally reviewed and noted the following in the patient's chart:   . Medical and social history . Use of alcohol, tobacco or illicit drugs  . Current medications and supplements . Functional ability and status . Nutritional status . Physical activity . Advanced directives . List of other physicians . Hospitalizations, surgeries, and ER visits in previous 12 months . Vitals . Screenings to include cognitive, depression, and falls . Referrals and appointments  In addition, I have reviewed and discussed with patient certain preventive protocols, quality metrics, and best practice recommendations. A written personalized care plan for preventive services as well as general preventive health recommendations were provided to patient.     OBrien-Blaney, Raniya Golembeski L, LPN  1/61/09601/24/2020   I have reviewed the above information and agree with above.   Duncan Dulleresa Tullo, MD

## 2018-09-10 NOTE — Patient Instructions (Addendum)
  Brandi [name] , Thank you for taking time to come for your Medicare Wellness Visit. I appreciate your ongoing commitment to your health goals. Please review the following plan we discussed and let me know if I can assist you in the future.   These are the goals we discussed: Goals    . Maintain Healthy Lifestyle     Stay active with walking Healthy diet Stay hydrated Continue with brain engaging activities       This is a list of the screening recommended for you and due dates:  Health Maintenance  Topic Date Due  . Urine Protein Check  08/12/1937  . Eye exam for diabetics  12/23/2017  . Flu Shot  03/18/2018  . Tetanus Vaccine  12/26/2020  . DEXA scan (bone density measurement)  Completed  . Pneumonia vaccines  Completed

## 2018-10-03 ENCOUNTER — Other Ambulatory Visit: Payer: Self-pay | Admitting: Cardiovascular Disease

## 2018-11-01 DIAGNOSIS — H6121 Impacted cerumen, right ear: Secondary | ICD-10-CM | POA: Diagnosis not present

## 2018-11-01 DIAGNOSIS — H903 Sensorineural hearing loss, bilateral: Secondary | ICD-10-CM | POA: Diagnosis not present

## 2018-11-03 ENCOUNTER — Ambulatory Visit (INDEPENDENT_AMBULATORY_CARE_PROVIDER_SITE_OTHER): Payer: Medicare Other | Admitting: Internal Medicine

## 2018-11-03 ENCOUNTER — Other Ambulatory Visit: Payer: Self-pay

## 2018-11-03 ENCOUNTER — Encounter: Payer: Self-pay | Admitting: Internal Medicine

## 2018-11-03 VITALS — BP 136/58 | HR 62 | Temp 97.5°F | Resp 15 | Ht 59.0 in | Wt 108.6 lb

## 2018-11-03 DIAGNOSIS — I350 Nonrheumatic aortic (valve) stenosis: Secondary | ICD-10-CM

## 2018-11-03 DIAGNOSIS — R635 Abnormal weight gain: Secondary | ICD-10-CM

## 2018-11-03 DIAGNOSIS — I872 Venous insufficiency (chronic) (peripheral): Secondary | ICD-10-CM | POA: Diagnosis not present

## 2018-11-03 DIAGNOSIS — Z79899 Other long term (current) drug therapy: Secondary | ICD-10-CM

## 2018-11-03 DIAGNOSIS — M25561 Pain in right knee: Secondary | ICD-10-CM

## 2018-11-03 DIAGNOSIS — R600 Localized edema: Secondary | ICD-10-CM

## 2018-11-03 DIAGNOSIS — I080 Rheumatic disorders of both mitral and aortic valves: Secondary | ICD-10-CM

## 2018-11-03 LAB — COMPREHENSIVE METABOLIC PANEL
ALT: 21 U/L (ref 0–35)
AST: 33 U/L (ref 0–37)
Albumin: 3.8 g/dL (ref 3.5–5.2)
Alkaline Phosphatase: 63 U/L (ref 39–117)
BUN: 23 mg/dL (ref 6–23)
CHLORIDE: 105 meq/L (ref 96–112)
CO2: 30 meq/L (ref 19–32)
Calcium: 9.4 mg/dL (ref 8.4–10.5)
Creatinine, Ser: 0.89 mg/dL (ref 0.40–1.20)
GFR: 59.69 mL/min — AB (ref >60.00)
GLUCOSE: 132 mg/dL — AB (ref 70–99)
POTASSIUM: 4.1 meq/L (ref 3.5–5.1)
Sodium: 139 mEq/L (ref 135–145)
Total Bilirubin: 0.5 mg/dL (ref 0.2–1.2)
Total Protein: 6.8 g/dL (ref 6.0–8.3)

## 2018-11-03 LAB — TSH: TSH: 1.58 u[IU]/mL (ref 0.35–4.50)

## 2018-11-03 NOTE — Patient Instructions (Addendum)
Weigh yourself daily  If you gain 2 lbs overnight,  Take the furosemide,  Or if you gain 5 lbs in a week,  Take a dose of 20 mg  I will make the referral to Dr Landry Mellow at [facility]    Mitral Valve Stenosis  Mitral valve stenosis is a narrowing of the mitral valve. This can limit blood flow between the upper left chamber (left atrium) and lower left chamber (left ventricle) of the heart. This condition is likely to be diagnosed when your health care provider hears an abnormal sound (heart murmur) while listening to your heart. This condition can range from mild to severe. What are the causes? This condition may be caused by:  Rheumatic fever, which is a complication of a strep infection.  Buildup of calcium around the valve. This can occur with aging.  A problem that is present at birth (congenital defect).  Certain long-term (chronic) diseases. What are the signs or symptoms? Symptoms of this condition include:  Shortness of breath.  Cough.  Loud noises when breathing (wheezing).  Fatigue or decreased energy.  Fast or irregular heartbeat (palpitations).  Chest pain.  Pain in the arm, neck, jaw, or face.  Swollen feet or ankles.  Hoarse voice.  Pink and purple patches of skin on the face. How is this diagnosed? This condition is diagnosed based on the results of a physical exam. Your health care provider will check for a heart murmur by listening to your heart. You may also have other tests, including:  An echocardiogram. This test creates ultrasound images of the heart that allow your health care provider to see how the heart valves work while your heart is beating.  Cardiac catheterization. This test is used to look at the structure and function of the heart. A thin tube (catheter) is passed through the blood vessels and into the heart. Dye is injected into the blood vessels so the cardiac system can be seen on images that are taken.  Chest X-ray. How is this  treated? Treatment for this condition depends on the severity of the condition. It may include:  Medicines to keep the heart rate regular.  Medicines to control blood pressure.  Blood thinners (anticoagulants) to prevent blood clots.  Antibiotic medicines to prevent infections. Defective heart valves are more likely to become infected.  Percutaneous balloon valvuloplasty. This is a procedure in which a catheter is passed through a blood vessel to the heart. A tiny balloon at the tip of the catheter is inflated to open up the narrowed mitral valve.  Open heart surgery to repair or replace the mitral valve. Follow these instructions at home: Lifestyle   Limit your intake of caffeine and alcohol. Both of these substances can affect your heart's rate and rhythm. ? Limit alcohol intake no more than 1 drink a day for nonpregnant women and 2 drinks a day for men. One drink equals 12 oz of beer, 5 oz of wine, or 1 oz of hard liquor.  Do not use any products that contain nicotine or tobacco, such as cigarettes and e-cigarettes. If you need help quitting, ask your health care provider.  Achieve and maintain a healthy weight.  Ask your health care provider what kinds of exercise are safe for you.  Eat a heart-healthy diet that includes plenty of fresh fruits and vegetables, whole grains, low-fat (lean) protein, and low-fat dairy products. Consider working with a diet and nutrition specialist (dietitian) to help you make healthy food choices.  Limit the amount of salt (sodium) in your diet. Avoid adding salt to foods, and avoid foods that are high in salt, such as: ? Pickles. ? Smoked and cured meats. ? Processed foods. General instructions  Take over-the-counter and prescription medicines only as told by your health care provider.  Before any dental procedures, tell your dentist that you have mitral valve stenosis. You may need antibiotics to prevent a heart infection.  If you plan to  become pregnant, talk with your health care provider first.  Keep all follow-up visits as told by your health care provider. This is important. Contact a health care provider if:  You have a fever.  You feel more tired than usual when doing physical activity.  You have a dry cough. Get help right away if:  You have pain or pressure in your chest that does not go away.  You have difficulty breathing.  You have palpitations.  You have a sudden weight gain.  You have swelling in your feet, ankles, or legs.  You have trouble staying awake or you faint.  You feel confused. These symptoms may represent a serious problem that is an emergency. Do not wait to see if the symptoms will go away. Get medical help right away. Call your local emergency services (911 in the [place] Do not drive yourself to the hospital. Summary  Mitral valve stenosis is a narrowing of the mitral valve. This condition can limit blood flow on the left side of your heart.  Depending on how severe your condition is, you may be treated with medicines, a procedure, or surgery.  Practice heart-healthy habits to manage this condition. These include limiting alcohol, avoiding nicotine and tobacco, and eating a balanced diet that is low in salt (sodium). This information is not intended to replace advice given to you by your health care provider. Make sure you discuss any questions you have with your health care provider. Document Released: 01/22/2010 Document Revised: 05/16/2016 Document Reviewed: 05/16/2016 Elsevier Interactive Patient Education  2019 [employer].

## 2018-11-03 NOTE — Progress Notes (Signed)
Subjective:  Patient ID: Brandi [name], female    DOB: 23-Oct-1926  Age: 85 y.o. MRN: [id number]  CC: The primary encounter diagnosis was Weight gain. Diagnoses of Long-term current use of high risk medication other than anticoagulant, MITRAL REGURGITATION, Localized edema, Chronic venous insufficiency, and Nonrheumatic aortic valve stenosis were also pertinent to this visit.  HPI Brandi [name] [facility] presents for follow up on chronic conditions including pulm htn, hyperrlipidemia and htn   Lives independently at [facility], but the  [employer] and exercise room have been temporarily closed.  Still walking inside for exercise.   Cc right knee pain Improves with walking activity, but develops swelling in the right lower leg  as the day progresses and it is aggravated by wearing a compressive knee brace that she likes to wear on her kneel.  Wants to see orthooedist at [facility] .  Kubinski.  History of Baker's Cyst 6 months ago that ruptured and caused bruising , same leg. . Denies pain,  But has discomfort when she twists   Tolerating Prolia injection started recently for management  Of osteoporosis   Using furosemide 20 mg prn. Has severe mitral valve  stenosis  With EF 65 to 70%, states she was advised by Dr  Mariah Milling to have a valve replacement , but she has deferred.  Wants my opinion.  She is active daily and asymptomatic. Afraid of complications given her age    .Saw ENT recently for a hearing test,  and hearing had improved to 80%.  Thought she was on too high a dose of furosemide for unclear reasons. patient unable to discuss why  Outpatient Medications Prior to Visit  Medication Sig Dispense Refill   amLODipine (NORVASC) 5 MG tablet TAKE 1 TABLET BY MOUTH  DAILY 90 tablet 3   aspirin 81 MG tablet Takes three times a week     ezetimibe (ZETIA) 10 MG tablet TAKE 1 TABLET BY MOUTH  DAILY 90 tablet 1   furosemide (LASIX) 20 MG tablet TAKE 1 TABLET BY MOUTH  DAILY AS NEEDED FOR FLUID   RETENTION 90 tablet 2   Multiple Vitamin (MULTIVITAMIN) tablet Take 1 tablet by mouth daily.       potassium chloride (K-DUR,KLOR-CON) 10 MEQ tablet TAKE 1 TABLET BY MOUTH  DAILY 90 tablet 3   No facility-administered medications prior to visit.     Review of Systems;  Patient denies headache, fevers, malaise, unintentional weight loss, skin rash, eye pain, sinus congestion and sinus pain, sore throat, dysphagia,  hemoptysis , cough, dyspnea, wheezing, chest pain, palpitations, orthopnea, edema, abdominal pain, nausea, melena, diarrhea, constipation, flank pain, dysuria, hematuria, urinary  Frequency, nocturia, numbness, tingling, seizures,  Focal weakness, Loss of consciousness,  Tremor, insomnia, depression, anxiety, and suicidal ideation.      Objective:  BP (!) 136/58 (BP Location: Left Arm, Patient Position: Sitting, Cuff Size: Normal)    Pulse 62    Temp (!) 97.5 F (36.4 C) (Oral)    Resp 15    Ht  (1.499 m)    Wt 108 lb 9.6 oz (49.3 kg)    SpO2 97%    BMI 21.93 kg/m   BP Readings from Last 3 Encounters:  11/03/18 (!) 136/58  09/10/18 120/62  07/19/18 138/66    Wt Readings from Last 3 Encounters:  11/03/18 108 lb 9.6 oz (49.3 kg)  09/10/18 109 lb 6.4 oz (49.6 kg)  07/19/18 109 lb 12 oz (49.8 kg)  General appearance: alert, cooperative and appears stated age Ears: normal TM's and external ear canals both ears Throat: lips, mucosa, and tongue normal; teeth and gums normal Neck: no adenopathy, no carotid bruit, supple, symmetrical, trachea midline and thyroid not enlarged, symmetric, no tenderness/mass/nodules Back: symmetric, no curvature. ROM normal. No CVA tenderness. Lungs: clear to auscultation bilaterally Heart: regular rate and rhythm, S1, S2 normal, no murmur, click, rub or gallop Abdomen: soft, non-tender; bowel sounds normal; no masses,  no organomegaly Pulses: 2+ and symmetric Skin: Skin color, texture, turgor normal. No rashes or lesions Lymph nodes:  Cervical, supraclavicular, and axillary nodes normal.  Lab Results  Component Value Date   HGBA1C 5.5 05/22/2017    Lab Results  Component Value Date   CREATININE 0.89 11/03/2018   CREATININE 0.88 05/05/2018   CREATININE 0.80 11/03/2017    Lab Results  Component Value Date   WBC 6.1 08/23/2015   HGB 14.1 08/23/2015   HCT 43.0 08/23/2015   PLT 239.0 08/23/2015   GLUCOSE 132 (H) 11/03/2018   CHOL 175 05/05/2018   TRIG 108.0 05/05/2018   HDL 59.80 05/05/2018   LDLDIRECT 91.0 10/31/2016   LDLCALC 93 05/05/2018   ALT 21 11/03/2018   AST 33 11/03/2018   NA 139 11/03/2018   K 4.1 11/03/2018   CL 105 11/03/2018   CREATININE 0.89 11/03/2018   BUN 23 11/03/2018   CO2 30 11/03/2018   TSH 1.58 11/03/2018   HGBA1C 5.5 05/22/2017    Dg Bone Density  Result Date: 06/08/2018 EXAM: DUAL X-RAY ABSORPTIOMETRY (DXA) FOR BONE MINERAL DENSITY IMPRESSION: jbh Your patient Brandi [name] completed a BMD test on 06/08/2018 using the [employer] iDXA DXA System (analysis version: 14.10) manufactured by [employer]. The following summarizes the results of our evaluation. PATIENT BIOGRAPHICAL: Name: Brandi [name], Brandi [name] Patient ID: [id number] Birth Date: 30-Dec-1926 Height: 59.0 in. Gender: Female Exam Date: 06/08/2018 Weight: 107.4 lbs. Indications: Advanced Age, History of Fracture (Adult), Postmenopausal Fractures: Right elbow Treatments: Calcitonin, Calcium ASSESSMENT: The BMD measured at AP Spine L1-L2 is 0.625 g/cm2 with a T-score of -4.5. This patient is considered osteoporotic according to World Health Organization [facility]) criteria. L-3 & 4 were excluded due to degenerative changes. The quality of the scan is good. Site Region Measured Measured WHO Young Adult BMD Date       Age      Classification T-score AP Spine L1-L2 06/08/2018 88.8 Osteoporosis -4.5 0.625 g/cm2 AP Spine L1-L2 08/26/2011 82.0 Osteoporosis -4.3 0.656 g/cm2 AP Spine L1-L2 08/26/2011 82.0 Osteoporosis -4.1 0.674 g/cm2 DualFemur Neck Left  06/08/2018 88.8 Osteoporosis -3.3 0.578 g/cm2 DualFemur Neck Left 08/26/2011 82.0 Osteoporosis -2.7 0.659 g/cm2 DualFemur Neck Left 08/26/2011 82.0 Osteoporosis -2.8 0.651 g/cm2 World Health Organization [facility]) criteria for post-menopausal, Caucasian Women: Normal:       T-score at or above -1 SD Osteopenia:   T-score between -1 and -2.5 SD Osteoporosis: T-score at or below -2.5 SD RECOMMENDATIONS: 1. All patients should optimize calcium and vitamin D intake. 2. Consider FDA-approved medical therapies in postmenopausal women and men aged 30 years and older, based on the following: a. A hip or vertebral(clinical or morphometric) fracture b. T-score < -2.5 at the femoral neck or spine after appropriate evaluation to exclude secondary causes c. Low bone mass (T-score between -1.0 and -2.5 at the femoral neck or spine) and a 10-year probability of a hip fracture > 3% or a 10-year probability of a major osteoporosis-related fracture > 20% based on the [place]-adapted WHO algorithm d. Clinician  judgment and/or patient preferences may indicate treatment for people with 10-year fracture probabilities above or below these levels FOLLOW-UP: People with diagnosed cases of osteoporosis or at high risk for fracture should have regular bone mineral density tests. For patients eligible for Medicare, routine testing is allowed once every 2 years. The testing frequency can be increased to one year for patients who have rapidly progressing disease, those who are receiving or discontinuing medical therapy to restore bone mass, or have additional risk factors. I have reviewed this report, and agree with the above findings. [facility] Radiology Electronically Signed   By: Myles Rosenthal M.D.   On: 06/08/2018 10:08    Assessment & Plan:   Problem List Items Addressed This Visit    MITRAL REGURGITATION    She has no interest in valvular  Surgery because she is asymptomatic and active        Long-term current use of high risk medication  other than anticoagulant   Relevant Orders   Comprehensive metabolic panel (Completed)   Edema    Multifactorial with V and right sided heart failure suggested by elevated pulmonary pressure . Has been taking daily lasix since march 2019. .  Lab Results  Component Value Date   NA 139 11/03/2018   K 4.1 11/03/2018   CL 105 11/03/2018   CO2 30 11/03/2018   Lab Results  Component Value Date   CREATININE 0.89 11/03/2018          Chronic venous insufficiency    Advised to continue daily use of compression knee highs       Aortic valve stenosis    Moderate,  With mitral valve regurgitation   Severe tricuspid regurgitation, bilateral enlargement and pulmonary hypertension .  She remains aymptomatic       Other Visit Diagnoses    Weight gain    -  Primary   Relevant Orders   TSH (Completed)      I am having Brandi [name]. Brandi [name] maintain her multivitamin, aspirin, amLODipine, ezetimibe, potassium chloride, and furosemide.  No orders of the defined types were placed in this encounter.   There are no discontinued medications.  Follow-up: Return in about 6 months (around 05/06/2019).   [name], MD

## 2018-11-04 DIAGNOSIS — M25561 Pain in right knee: Secondary | ICD-10-CM | POA: Insufficient documentation

## 2018-11-04 NOTE — Assessment & Plan Note (Signed)
Advised to continue daily use of compression knee highs

## 2018-11-04 NOTE — Assessment & Plan Note (Signed)
Multifactorial with V and right sided heart failure suggested by elevated pulmonary pressure . Has been taking daily lasix since march 2019. .  Lab Results  Component Value Date   NA 139 11/03/2018   K 4.1 11/03/2018   CL 105 11/03/2018   CO2 30 11/03/2018   Lab Results  Component Value Date   CREATININE 0.89 11/03/2018

## 2018-11-04 NOTE — Assessment & Plan Note (Signed)
She has no interest in valvular  Surgery because she is asymptomatic and active

## 2018-11-04 NOTE — Assessment & Plan Note (Signed)
Moderate,  With mitral valve regurgitation   Severe tricuspid regurgitation, bilateral enlargement and pulmonary hypertension .  She remains aymptomatic

## 2018-11-04 NOTE — Assessment & Plan Note (Signed)
Referral to Dr Landry Mellow at [place] [facility] per patient request.

## 2018-11-05 ENCOUNTER — Other Ambulatory Visit: Payer: Self-pay | Admitting: Cardiovascular Disease

## 2019-01-21 ENCOUNTER — Telehealth: Payer: Self-pay | Admitting: Internal Medicine

## 2019-01-21 IMAGING — US US EXTREM LOW VENOUS*L*
1 series · 14 of 24 positions shown · non-contrast
Comparison: None

CLINICAL DATA: Left calf swelling and bruising x2 weeks

EXAM:
LEFT LOWER EXTREMITY VENOUS DOPPLER ULTRASOUND
TECHNIQUE: Gray-scale sonography with compression, as well as color and duplex
ultrasound, were performed to evaluate the deep venous system from
the level of the common femoral vein through the popliteal and
proximal calf veins.

[Series 1: us extrem low venous*left* · 0.06mm/px · 14 of 43 slices shown]
[im 1/43]
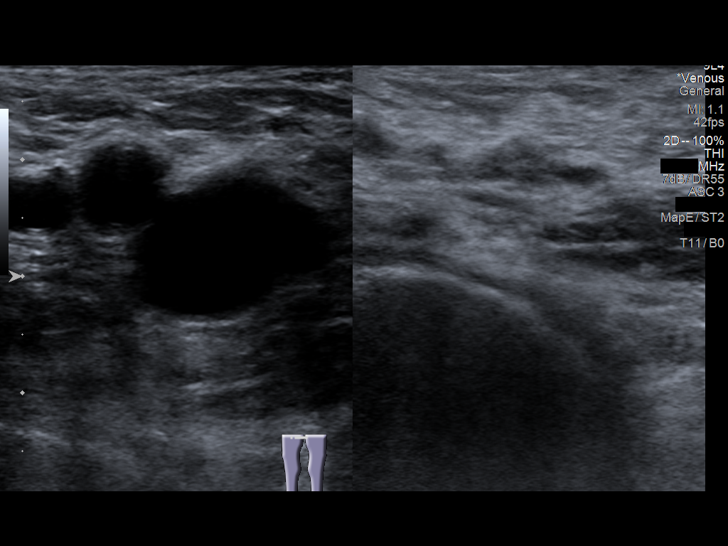
[im 4/43]
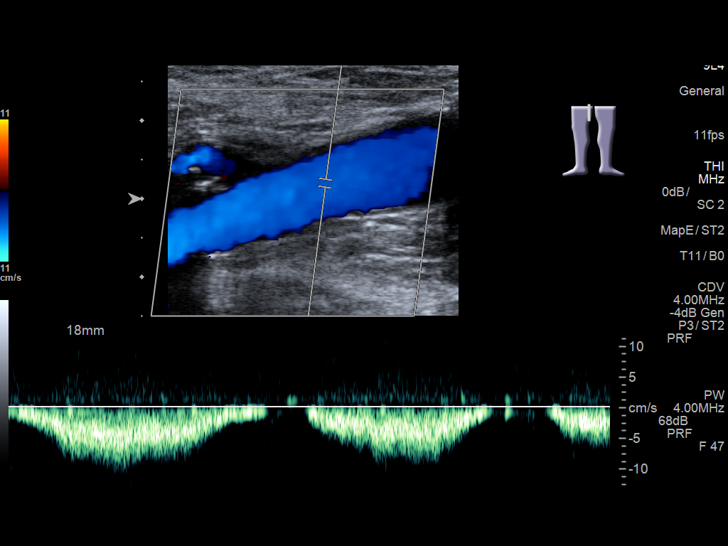
[im 8/43]
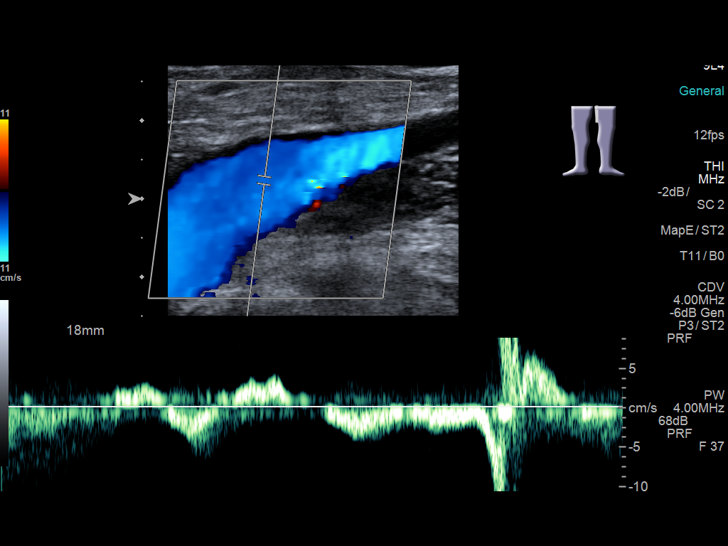
[im 11/43]
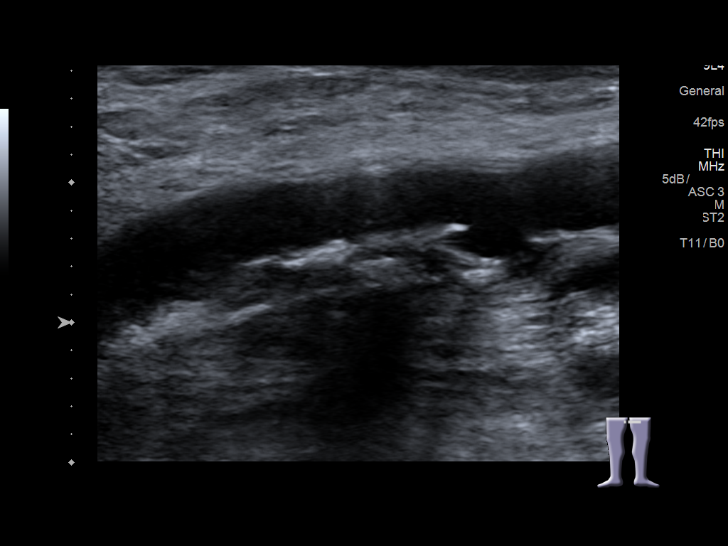
[im 13/43]
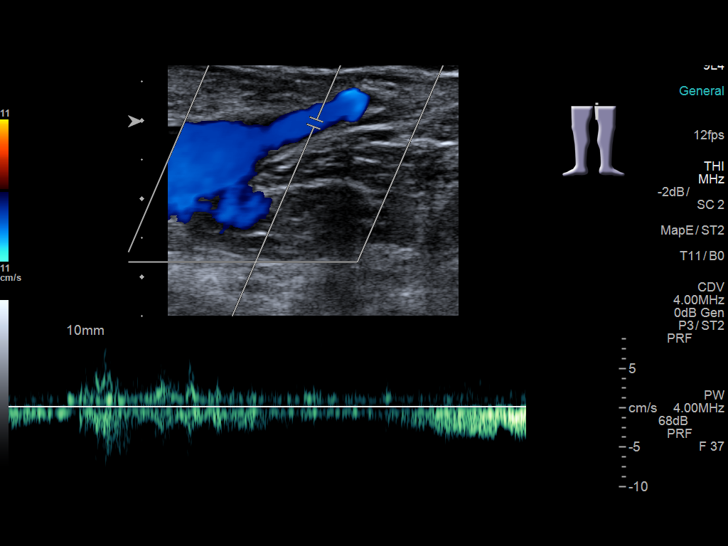
[im 17/43]
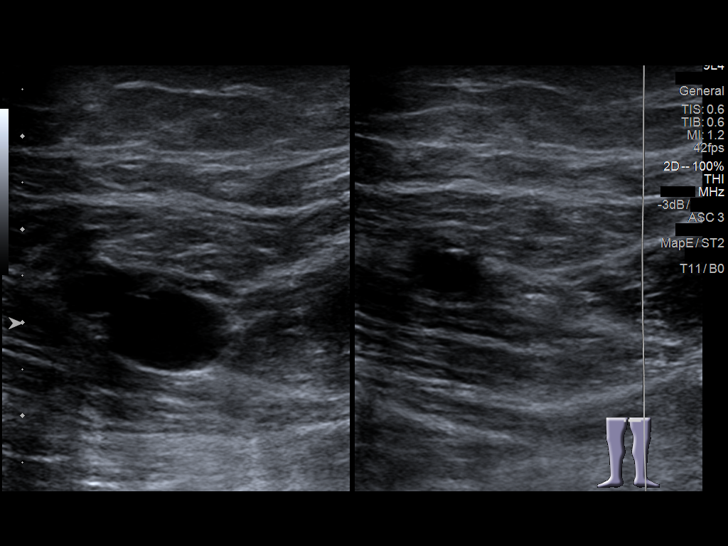
[im 21/43]
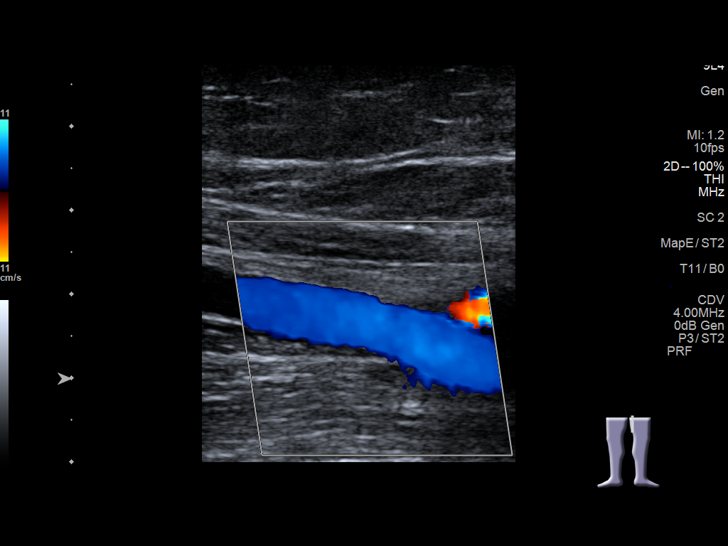
[im 22/43]
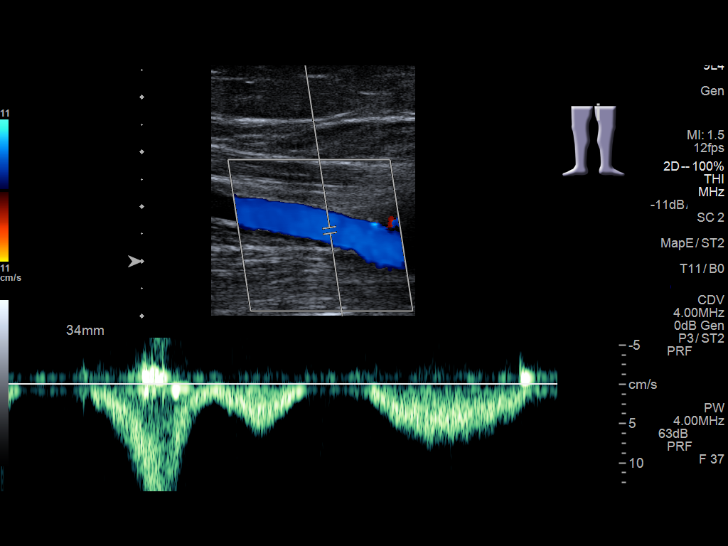
[im 26/43]
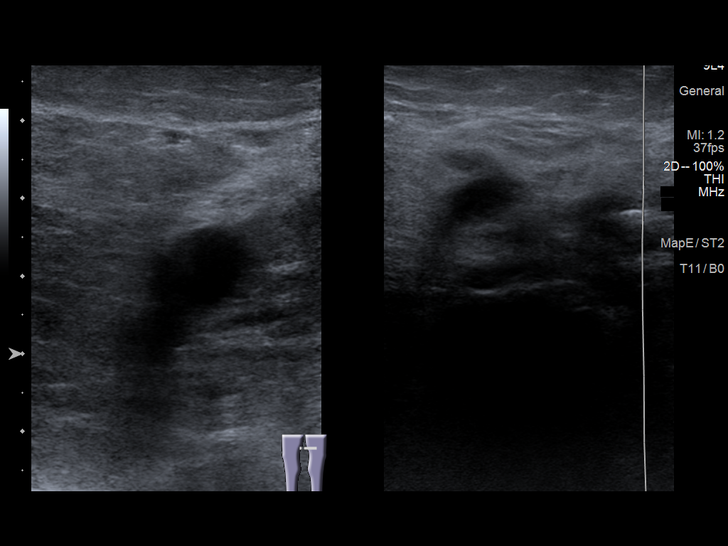
[im 30/43]
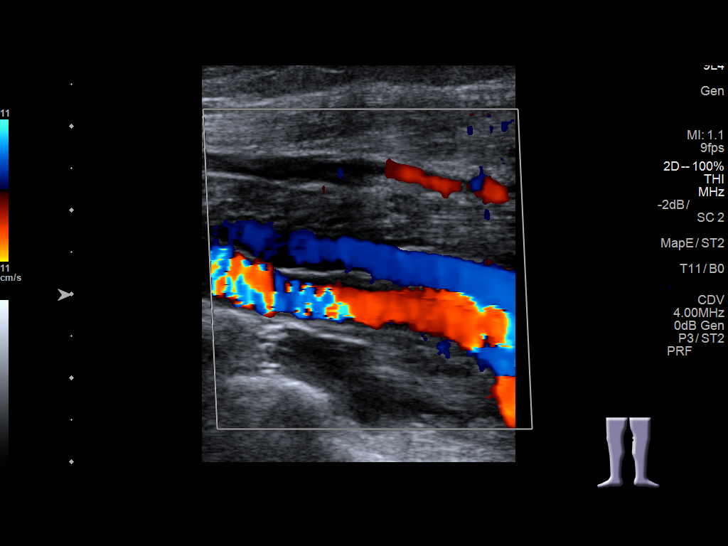
[im 33/43]
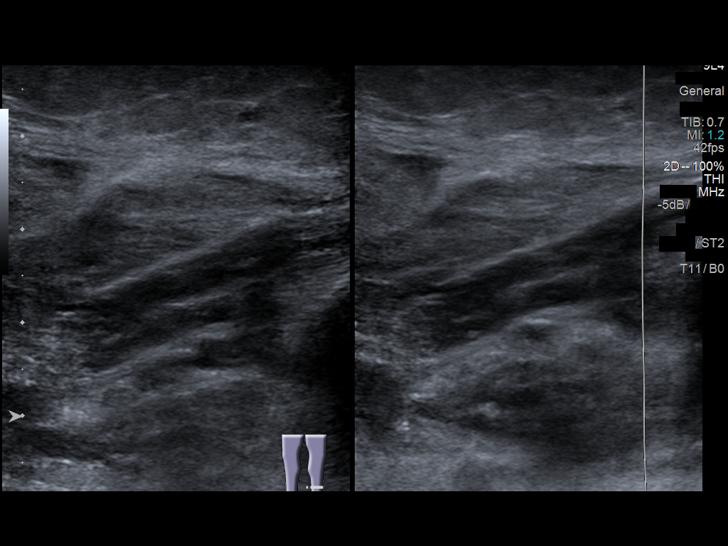
[im 35/43]
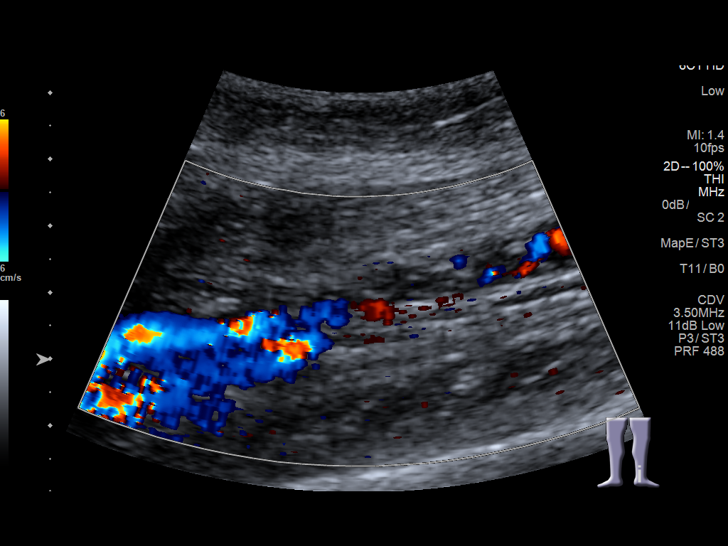
[im 39/43]
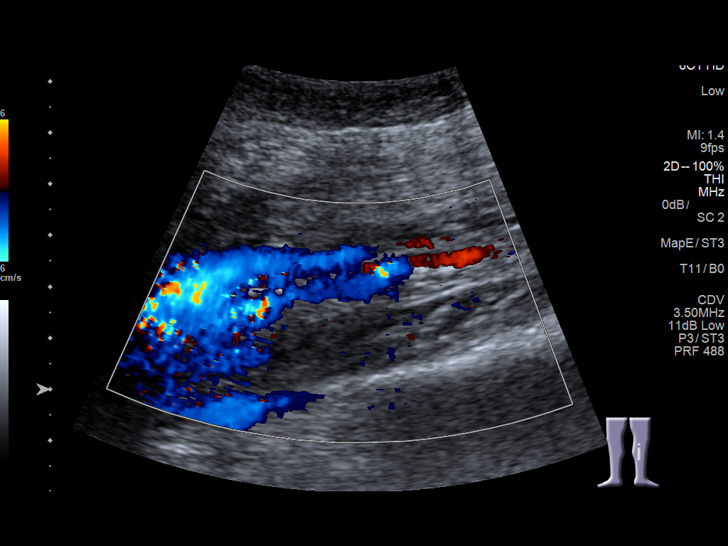
[im 43/43]
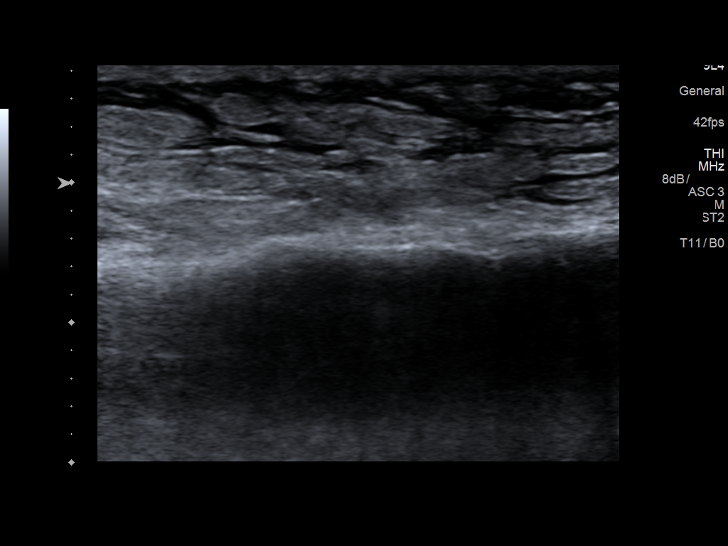

[14 of 24 positions shown; findings below may reference images not displayed]

FINDINGS: Normal compressibility of the common femoral, superficial femoral,
and popliteal veins, as well as the proximal calf veins. No filling
defects to suggest DVT on grayscale or color Doppler imaging.
Doppler waveforms show normal direction of venous flow, normal
respiratory phasicity and response to augmentation. Survey views of
the contralateral common femoral vein are unremarkable. Subcutaneous
edema at the lateral ankle level.
IMPRESSION: No evidence of LEFT lower extremity deep vein thrombosis.

## 2019-01-21 NOTE — Telephone Encounter (Signed)
Patient has been scheduled for Prolia injection.

## 2019-01-21 NOTE — Telephone Encounter (Signed)
Pt came into office. She is due for her prolia. Do I need to schedule or do you?

## 2019-01-21 NOTE — Telephone Encounter (Signed)
I will schedule

## 2019-01-24 ENCOUNTER — Telehealth: Payer: Self-pay | Admitting: Internal Medicine

## 2019-01-24 NOTE — Telephone Encounter (Signed)
Patient has been scheduled with PCP for tomorrow for COVID exposure patient friend tested positive. Patient has no symptoms was exposed on 01/23/19 at a gathering. Patient has been advised to Quarantine.

## 2019-01-24 NOTE — Telephone Encounter (Signed)
Copied from [place] [phone]. Topic: Quick Communication - See Telephone Encounter >> Jan 24, 2019 11:03 AM Blase Mess A wrote: CRM for notification. See Telephone encounter for: 01/24/19- Patient is calling she was exposed to someone with [employer] on 01/23/19. Patient has a projelia injection on 01/25/19. Patient is also requesting an order to be tested for COVID however has not symptoms at this time. [place] has already advised the patient to quarantine.Thank you. [phone]

## 2019-01-24 NOTE — Telephone Encounter (Signed)
Postponed

## 2019-01-24 NOTE — Telephone Encounter (Signed)
Prolia injectio should be postponed for 14 days from June 7

## 2019-01-25 ENCOUNTER — Ambulatory Visit: Payer: Medicare Other

## 2019-01-25 ENCOUNTER — Other Ambulatory Visit: Payer: Self-pay

## 2019-01-25 ENCOUNTER — Encounter: Payer: Self-pay | Admitting: Internal Medicine

## 2019-01-25 ENCOUNTER — Ambulatory Visit (INDEPENDENT_AMBULATORY_CARE_PROVIDER_SITE_OTHER): Payer: Medicare Other | Admitting: Internal Medicine

## 2019-01-25 ENCOUNTER — Telehealth: Payer: Self-pay

## 2019-01-25 DIAGNOSIS — Z20822 Contact with and (suspected) exposure to covid-19: Secondary | ICD-10-CM | POA: Insufficient documentation

## 2019-01-25 DIAGNOSIS — Z20828 Contact with and (suspected) exposure to other viral communicable diseases: Secondary | ICD-10-CM | POA: Diagnosis not present

## 2019-01-25 NOTE — Telephone Encounter (Signed)
Pt called per request of Dr Derrel Nip.  COVID-19 test scheduled and order placed.

## 2019-01-25 NOTE — Telephone Encounter (Signed)
-----   Message from Crecencio Mc, MD sent at 01/25/2019  4:12 PM EDT ----- Need for covid 19 testing due to exposure to confirmed case

## 2019-01-25 NOTE — Assessment & Plan Note (Signed)
Patient had contact with Sandi Mealy  Prior to his results of a positive covid 19 test . She is at high risk for deterioration fue to age.  Sending for testing

## 2019-01-25 NOTE — Progress Notes (Signed)
Telephone  Note  This visit type was conducted due to national recommendations for restrictions regarding the COVID-19 pandemic (e.g. social distancing).  This format is felt to be most appropriate for this patient at this time.  All issues noted in this document were discussed and addressed.  No physical exam was performed (except for noted visual exam findings with Video Visits).   I connected with@ on 01/25/19 at  4:00 PM EDT by telephone and verified that I am speaking with the correct person using two identifiers. Location patient: home Location provider: work or home office Persons participating in the virtual visit: patient, provider  I discussed the limitations, risks, security and privacy concerns of performing an evaluation and management service by telephone and the availability of in person appointments. I also discussed with the patient that there may be a patient responsible charge related to this service. The patient expressed understanding and agreed to proceed Reason for visit: Potential COVID EXPOSURE ,    HPI:  85 yr old female spent an hour visiting her neighbor Merlene MorseRobert Peden who later found out that he had been exposed to COVID 19 at a birthday party .   The patient has no signs or symptoms of COVID 19 infection (fever, cough, sore throat  or shortness of breath beyond what is typical for patient).    ROS: See pertinent positives and negatives per HPI.  Past Medical History:  Diagnosis Date  . Hyperlipidemia   . Mitral regurgitation   . Mitral valve prolapse     Past Surgical History:  Procedure Laterality Date  . BIOPSY BREAST  1980   left ,  benign  . BREAST BIOPSY  1980   normal  . Hospitalized for endocarditis  1987    Family History  Problem Relation Age of Onset  . Heart attack Father   . Coronary artery disease Father   . Cancer Sister        esophageal  . Heart disease Brother     SOCIAL HX: widowed,  Lives with assistance    Current  Outpatient Medications:  .  amLODipine (NORVASC) 5 MG tablet, TAKE 1 TABLET BY MOUTH  DAILY, Disp: 90 tablet, Rfl: 1 .  aspirin 81 MG tablet, Takes three times a week, Disp: , Rfl:  .  ezetimibe (ZETIA) 10 MG tablet, TAKE 1 TABLET BY MOUTH  DAILY, Disp: 90 tablet, Rfl: 1 .  furosemide (LASIX) 20 MG tablet, TAKE 1 TABLET BY MOUTH  DAILY AS NEEDED FOR FLUID  RETENTION, Disp: 90 tablet, Rfl: 2 .  Multiple Vitamin (MULTIVITAMIN) tablet, Take 1 tablet by mouth daily.  , Disp: , Rfl:  .  potassium chloride (K-DUR,KLOR-CON) 10 MEQ tablet, TAKE 1 TABLET BY MOUTH  DAILY, Disp: 90 tablet, Rfl: 3  EXAM:   General impression: alert, cooperative and articulate.  No signs of being in distress  Lungs: speech is fluent sentence length suggests that patient is not short of breath and not punctuated by cough, sneezing or sniffing. [name].   Psych: affect normal.  speech is articulate and non pressured .  Denies suicidal thoughts    ASSESSMENT AND PLAN:  Discussed the following assessment and plan:  No diagnosis found.  No problem-specific Assessment & Plan notes found for this encounter.    I discussed the assessment and treatment plan with the patient. The patient was provided an opportunity to ask questions and all were answered. The patient agreed with the plan and demonstrated an understanding of the instructions.  The patient was advised to call back or seek an in-person evaluation if the symptoms worsen or if the condition fails to improve as anticipated.  I provided 15 minutes of non-face-to-face time during this encounter.   Crecencio Mc, MD

## 2019-01-26 ENCOUNTER — Other Ambulatory Visit: Payer: Medicare Other

## 2019-01-26 DIAGNOSIS — R6889 Other general symptoms and signs: Secondary | ICD-10-CM | POA: Diagnosis not present

## 2019-01-26 DIAGNOSIS — Z20822 Contact with and (suspected) exposure to covid-19: Secondary | ICD-10-CM

## 2019-01-27 LAB — NOVEL CORONAVIRUS, NAA: SARS-CoV-2, NAA: NOT DETECTED

## 2019-02-07 ENCOUNTER — Other Ambulatory Visit: Payer: Self-pay | Admitting: Internal Medicine

## 2019-02-09 ENCOUNTER — Telehealth: Payer: Self-pay | Admitting: Internal Medicine

## 2019-02-09 NOTE — Telephone Encounter (Signed)
Pt called and wanted to know if she was do for her Prolia  Please let pt know

## 2019-02-09 NOTE — Telephone Encounter (Signed)
Copied from [place] [phone]. Topic: Appointment Scheduling - Scheduling Inquiry for Clinic >> Feb 09, 2019 11:04 AM Rutherford Nail, NT wrote: Reason for CRM: Patient calling to schedule Prolia Injection. Spoke with Caryl Pina who is going to have [place] order injection and reach out to patient to schedule. Please advise.

## 2019-02-15 NOTE — Telephone Encounter (Signed)
Patient scheduled.

## 2019-02-15 NOTE — Telephone Encounter (Signed)
Pt called back to follow up on Prolia injection. Please advise? Thank you!

## 2019-02-16 ENCOUNTER — Other Ambulatory Visit: Payer: Self-pay

## 2019-02-16 ENCOUNTER — Ambulatory Visit (INDEPENDENT_AMBULATORY_CARE_PROVIDER_SITE_OTHER): Payer: Medicare Other

## 2019-02-16 DIAGNOSIS — M810 Age-related osteoporosis without current pathological fracture: Secondary | ICD-10-CM

## 2019-02-16 MED ORDER — DENOSUMAB 60 MG/ML ~~LOC~~ SOSY
60.0000 mg | PREFILLED_SYRINGE | Freq: Once | SUBCUTANEOUS | Status: AC
  Administered 2019-02-16: 60 mg via SUBCUTANEOUS

## 2019-02-16 NOTE — Progress Notes (Signed)
Brandi [name] presents today for injection per MD orders. Prolia Injection administered SQ in left Upper Arm. Administration without incident. Patient tolerated well.  Nina,cma

## 2019-02-17 ENCOUNTER — Ambulatory Visit: Payer: Medicare Other

## 2019-03-23 DIAGNOSIS — H2511 Age-related nuclear cataract, right eye: Secondary | ICD-10-CM | POA: Diagnosis not present

## 2019-05-06 ENCOUNTER — Other Ambulatory Visit: Payer: Self-pay

## 2019-05-10 ENCOUNTER — Other Ambulatory Visit: Payer: Self-pay

## 2019-05-10 ENCOUNTER — Ambulatory Visit (INDEPENDENT_AMBULATORY_CARE_PROVIDER_SITE_OTHER): Payer: Medicare Other | Admitting: Internal Medicine

## 2019-05-10 ENCOUNTER — Encounter: Payer: Self-pay | Admitting: Internal Medicine

## 2019-05-10 VITALS — BP 114/56 | HR 80 | Temp 96.4°F | Resp 15 | Ht 59.0 in | Wt 109.2 lb

## 2019-05-10 DIAGNOSIS — I10 Essential (primary) hypertension: Secondary | ICD-10-CM

## 2019-05-10 DIAGNOSIS — Z79899 Other long term (current) drug therapy: Secondary | ICD-10-CM | POA: Diagnosis not present

## 2019-05-10 DIAGNOSIS — I350 Nonrheumatic aortic (valve) stenosis: Secondary | ICD-10-CM

## 2019-05-10 DIAGNOSIS — M810 Age-related osteoporosis without current pathological fracture: Secondary | ICD-10-CM | POA: Diagnosis not present

## 2019-05-10 DIAGNOSIS — R600 Localized edema: Secondary | ICD-10-CM | POA: Diagnosis not present

## 2019-05-10 DIAGNOSIS — E782 Mixed hyperlipidemia: Secondary | ICD-10-CM | POA: Diagnosis not present

## 2019-05-10 DIAGNOSIS — I340 Nonrheumatic mitral (valve) insufficiency: Secondary | ICD-10-CM

## 2019-05-10 DIAGNOSIS — Z23 Encounter for immunization: Secondary | ICD-10-CM

## 2019-05-10 NOTE — Patient Instructions (Signed)
RETURN FOR FASTING LABS  NO MEDICATION CHANGES ARE NEEDED  YOUR NEXT [place] INJECTION IS DUE IN February

## 2019-05-10 NOTE — Assessment & Plan Note (Signed)
ASYMPTOMATIC and active. .  No plans for surgery

## 2019-05-10 NOTE — Progress Notes (Signed)
Subjective:  Patient ID: Brandi [name], female    DOB: 08-Dec-1926  Age: 85 y.o. MRN: [id number]  CC: The primary encounter diagnosis was Essential hypertension. Diagnoses of Need for immunization against influenza, Long-term current use of high risk medication other than anticoagulant, Mixed hyperlipidemia, Mitral valve insufficiency, unspecified etiology, Nonrheumatic aortic valve stenosis, Localized edema, and Age-related osteoporosis without current pathological fracture were also pertinent to this visit.  HPI Brandi [name] [facility] presents for 6 month follow up on hypertension and hyperlipidemia . Last seen in June after a COVID 19 EXPOSURE.  She tested negative and had no symptoms of infection.  Lives at [facility],  4 POSITIVE CASES  CASES RECENTLY  ALL WERE EMPLOYEES WHO HAD FEVERS .  Patient is taking her medications (amlodipine and Zetia ) as prescribed and notes no adverse effects.  Home BP readings have been done about Once per week and are  generally < 130/80 .She is avoiding added salt in her diet and walking regularly about 5 times per week at 6 am FOR ONE MILE (30 MINUTES OR LESS)  for exercise . She is Using lasix "as needed"  less than 2/week for weight gain  Of 1.5 lbs or more .   Her presenting sign Is ankle edema ,  NEVER HAS orthopnea or DOE. Has moderate to severe MR WITH PULMONARY HYPERTENSION  by ECHO Dec 2019   Sleeping well most nights . Woken up by urge to urinate. Reads in bed using a Kindle .  No constipation or diarrhea .  OSTEOPOROSIS  BY DEXA in  2019  Managed with Prolia   Outpatient Medications Prior to Visit  Medication Sig Dispense Refill   amLODipine (NORVASC) 5 MG tablet TAKE 1 TABLET BY MOUTH  DAILY 90 tablet 1   aspirin 81 MG tablet Takes three times a week     ezetimibe (ZETIA) 10 MG tablet TAKE 1 TABLET BY MOUTH  DAILY 90 tablet 1   furosemide (LASIX) 20 MG tablet TAKE 1 TABLET BY MOUTH  DAILY AS NEEDED FOR FLUID  RETENTION 90 tablet 2   Multiple Vitamin  (MULTIVITAMIN) tablet Take 1 tablet by mouth daily.       potassium chloride (K-DUR,KLOR-CON) 10 MEQ tablet TAKE 1 TABLET BY MOUTH  DAILY 90 tablet 3   No facility-administered medications prior to visit.     Review of Systems;  Patient denies headache, fevers, malaise, unintentional weight loss, skin rash, eye pain, sinus congestion and sinus pain, sore throat, dysphagia,  hemoptysis , cough, dyspnea, wheezing, chest pain, palpitations, orthopnea, edema, abdominal pain, nausea, melena, diarrhea, constipation, flank pain, dysuria, hematuria, urinary  Frequency, nocturia, numbness, tingling, seizures,  Focal weakness, Loss of consciousness,  Tremor, insomnia, depression, anxiety, and suicidal ideation.      Objective:  BP (!) 114/56 (BP Location: Left Arm, Patient Position: Sitting, Cuff Size: Normal)    Pulse 80    Temp (!) 96.4 F (35.8 C) (Temporal)    Resp 15    Ht 4\' 11"  (1.499 m)    Wt 109 lb 3.2 oz (49.5 kg)    SpO2 97%    BMI 22.06 kg/m   BP Readings from Last 3 Encounters:  05/10/19 (!) 114/56  11/03/18 (!) 136/58  09/10/18 120/62    Wt Readings from Last 3 Encounters:  05/10/19 109 lb 3.2 oz (49.5 kg)  11/03/18 108 lb 9.6 oz (49.3 kg)  09/10/18 109 lb 6.4 oz (49.6 kg)    General appearance: alert, cooperative  and appears stated age Ears: normal TM's and external ear canals both ears Throat: lips, mucosa, and tongue normal; teeth and gums normal Neck: no adenopathy, no carotid bruit, supple, symmetrical, trachea midline and thyroid not enlarged, symmetric, no tenderness/mass/nodules Back: symmetric, no curvature. ROM normal. No CVA tenderness. Lungs: clear to auscultation bilaterally Heart: regular rate and rhythm, S1, S2 normal, no murmur, click, rub or gallop Abdomen: soft, non-tender; bowel sounds normal; no masses,  no organomegaly Pulses: 2+ and symmetric Skin: Skin color, texture, turgor normal. No rashes or lesions Lymph nodes: Cervical, supraclavicular, and  axillary nodes normal.  Lab Results  Component Value Date   HGBA1C 5.5 05/22/2017    Lab Results  Component Value Date   CREATININE 0.89 11/03/2018   CREATININE 0.88 05/05/2018   CREATININE 0.80 11/03/2017    Lab Results  Component Value Date   WBC 6.1 08/23/2015   HGB 14.1 08/23/2015   HCT 43.0 08/23/2015   PLT 239.0 08/23/2015   GLUCOSE 132 (H) 11/03/2018   CHOL 175 05/05/2018   TRIG 108.0 05/05/2018   HDL 59.80 05/05/2018   LDLDIRECT 91.0 10/31/2016   LDLCALC 93 05/05/2018   ALT 21 11/03/2018   AST 33 11/03/2018   NA 139 11/03/2018   K 4.1 11/03/2018   CL 105 11/03/2018   CREATININE 0.89 11/03/2018   BUN 23 11/03/2018   CO2 30 11/03/2018   TSH 1.58 11/03/2018   HGBA1C 5.5 05/22/2017    Dg Bone Density  Result Date: 06/08/2018 EXAM: DUAL X-RAY ABSORPTIOMETRY (DXA) FOR BONE MINERAL DENSITY IMPRESSION: jbh Your patient Brandi [name] completed a BMD test on 06/08/2018 using the [employer] iDXA DXA System (analysis version: 14.10) manufactured by [employer]. The following summarizes the results of our evaluation. PATIENT BIOGRAPHICAL: Name: [name], [name] Patient ID: [id number] Birth Date: [birth year] Height: 59.0 in. Gender: Female Exam Date: 06/08/2018 Weight: 107.4 lbs. Indications: Advanced Age, History of Fracture (Adult), Postmenopausal Fractures: Right elbow Treatments: Calcitonin, Calcium ASSESSMENT: The BMD measured at AP Spine L1-L2 is 0.625 g/cm2 with a T-score of -4.5. This patient is considered osteoporotic according to World Health Organization [facility]) criteria. L-3 & 4 were excluded due to degenerative changes. The quality of the scan is good. Site Region Measured Measured WHO Young Adult BMD Date       Age      Classification T-score AP Spine L1-L2 06/08/2018 88.8 Osteoporosis -4.5 0.625 g/cm2 AP Spine L1-L2 08/26/2011 82.0 Osteoporosis -4.3 0.656 g/cm2 AP Spine L1-L2 08/26/2011 82.0 Osteoporosis -4.1 0.674 g/cm2 DualFemur Neck Left 06/08/2018 88.8 Osteoporosis -3.3 0.578  g/cm2 DualFemur Neck Left 08/26/2011 82.0 Osteoporosis -2.7 0.659 g/cm2 DualFemur Neck Left 08/26/2011 82.0 Osteoporosis -2.8 0.651 g/cm2 World Health Organization [facility]) criteria for post-menopausal, Caucasian Women: Normal:       T-score at or above -1 SD Osteopenia:   T-score between -1 and -2.5 SD Osteoporosis: T-score at or below -2.5 SD RECOMMENDATIONS: 1. All patients should optimize calcium and vitamin D intake. 2. Consider FDA-approved medical therapies in postmenopausal women and men aged 37 years and older, based on the following: a. A hip or vertebral(clinical or morphometric) fracture b. T-score < -2.5 at the femoral neck or spine after appropriate evaluation to exclude secondary causes c. Low bone mass (T-score between -1.0 and -2.5 at the femoral neck or spine) and a 10-year probability of a hip fracture > 3% or a 10-year probability of a major osteoporosis-related fracture > 20% based on the [place]-adapted WHO algorithm d. Clinician judgment and/or patient preferences  may indicate treatment for people with 10-year fracture probabilities above or below these levels FOLLOW-UP: People with diagnosed cases of osteoporosis or at high risk for fracture should have regular bone mineral density tests. For patients eligible for Medicare, routine testing is allowed once every 2 years. The testing frequency can be increased to one year for patients who have rapidly progressing disease, those who are receiving or discontinuing medical therapy to restore bone mass, or have additional risk factors. I have reviewed this report, and agree with the above findings. [facility] Radiology Electronically Signed   By: Earle Gell M.D.   On: 06/08/2018 10:08    Assessment & Plan:   Problem List Items Addressed This Visit      Unprioritized   Long-term current use of high risk medication other than anticoagulant   Relevant Orders   Comprehensive metabolic panel   Hyperlipidemia   Relevant Orders   Lipid panel   TSH     Essential hypertension - Primary    Well controlled on current regimen. Renal function stable, no changes today.      Osteoporosis    Managed with prolia.  Next dose due Feb 2021      Edema    Managed with lasix prn for weight gain  of 2 lbs overnight,       Aortic valve stenosis    Asymptomatic,  Continue annual follow up with cardiology       Mitral regurgitation    ASYMPTOMATIC and active. .  No plans for surgery        Other Visit Diagnoses    Need for immunization against influenza       Relevant Orders   Flu Vaccine QUAD High Dose(Fluad) (Completed)    A total of 25 minutes of face to face time was spent with patient more than half of which was spent in counselling about the above mentioned conditions, reviewing ECHO and DEXA reports  and coordination of care   I am having Antionette Poles. Labarbera maintain her multivitamin, aspirin, potassium chloride, furosemide, amLODipine, and ezetimibe.  No orders of the defined types were placed in this encounter.   There are no discontinued medications.  Follow-up: No follow-ups on file.   Crecencio Mc, MD

## 2019-05-10 NOTE — Assessment & Plan Note (Signed)
Asymptomatic,  Continue annual follow up with cardiology  

## 2019-05-10 NOTE — Assessment & Plan Note (Signed)
Managed with lasix prn for weight gain  of 2 lbs overnight,

## 2019-05-10 NOTE — Assessment & Plan Note (Signed)
Managed with prolia.  Next dose due Feb 2021

## 2019-05-10 NOTE — Assessment & Plan Note (Signed)
Well controlled on current regimen. Renal function stable, no changes today. 

## 2019-05-11 ENCOUNTER — Other Ambulatory Visit: Payer: Self-pay

## 2019-05-11 ENCOUNTER — Other Ambulatory Visit (INDEPENDENT_AMBULATORY_CARE_PROVIDER_SITE_OTHER): Payer: Medicare Other

## 2019-05-11 DIAGNOSIS — E782 Mixed hyperlipidemia: Secondary | ICD-10-CM

## 2019-05-11 DIAGNOSIS — Z79899 Other long term (current) drug therapy: Secondary | ICD-10-CM

## 2019-05-11 LAB — LIPID PANEL
Cholesterol: 173 mg/dL (ref 0–200)
HDL: 59.7 mg/dL (ref >39.00)
LDL Cholesterol: 101 mg/dL — ABNORMAL HIGH (ref 0–99)
NonHDL: 113.07
Total CHOL/HDL Ratio: 3
Triglycerides: 61 mg/dL (ref 0.0–149.0)
VLDL: 12.2 mg/dL (ref 0.0–40.0)

## 2019-05-11 LAB — COMPREHENSIVE METABOLIC PANEL
ALT: 17 U/L (ref 0–35)
AST: 33 U/L (ref 0–37)
Albumin: 3.7 g/dL (ref 3.5–5.2)
Alkaline Phosphatase: 61 U/L (ref 39–117)
BUN: 20 mg/dL (ref 6–23)
CO2: 30 mEq/L (ref 19–32)
Calcium: 9.7 mg/dL (ref 8.4–10.5)
Chloride: 101 mEq/L (ref 96–112)
Creatinine, Ser: 0.92 mg/dL (ref 0.40–1.20)
GFR: 57.38 mL/min — ABNORMAL LOW (ref >60.00)
Glucose, Bld: 84 mg/dL (ref 70–99)
Potassium: 4.3 mEq/L (ref 3.5–5.1)
Sodium: 137 mEq/L (ref 135–145)
Total Bilirubin: 0.8 mg/dL (ref 0.2–1.2)
Total Protein: 6.6 g/dL (ref 6.0–8.3)

## 2019-05-11 LAB — TSH: TSH: 1.56 u[IU]/mL (ref 0.35–4.50)

## 2019-05-19 ENCOUNTER — Other Ambulatory Visit: Payer: Self-pay | Admitting: Cardiovascular Disease

## 2019-07-25 ENCOUNTER — Ambulatory Visit: Payer: Medicare Other | Admitting: Cardiovascular Disease

## 2019-07-30 ENCOUNTER — Other Ambulatory Visit: Payer: Self-pay | Admitting: Internal Medicine

## 2019-08-04 ENCOUNTER — Telehealth: Payer: Self-pay | Admitting: Internal Medicine

## 2019-08-04 ENCOUNTER — Other Ambulatory Visit: Payer: Self-pay

## 2019-08-04 NOTE — Telephone Encounter (Signed)
Patient came in with complaint I think I might have gout in my great toe. Ask patient when did his start she stated X 3 weeks ago great toe red along with third toe on left foot, no swelling noted , not painful to touch, patient toes were cool to touch, had  patient remove shoe on right foot for comparison noted great toe and second toe same discoloration all toe nails were thick and opaque in color.touched tip of toes for blanching toes did not blanche, and bilateral pedal  pulses present but weak hard to feel. Spoke with PCP was advised ok to schedule for [facility] and advise patient to leave off compression socks until appointment. Patient was advised if symptoms changed she should call office back or be evaluated sooner. Vitals  02 sats 99 on room air.            BP 138/60             Pulse 83

## 2019-08-08 ENCOUNTER — Encounter: Payer: Self-pay | Admitting: Internal Medicine

## 2019-08-08 ENCOUNTER — Ambulatory Visit (INDEPENDENT_AMBULATORY_CARE_PROVIDER_SITE_OTHER): Payer: Medicare Other | Admitting: Internal Medicine

## 2019-08-08 ENCOUNTER — Other Ambulatory Visit: Payer: Self-pay

## 2019-08-08 VITALS — BP 136/60 | HR 74 | Temp 96.0°F | Resp 15 | Ht 59.0 in | Wt 110.0 lb

## 2019-08-08 DIAGNOSIS — Z79899 Other long term (current) drug therapy: Secondary | ICD-10-CM | POA: Diagnosis not present

## 2019-08-08 DIAGNOSIS — I10 Essential (primary) hypertension: Secondary | ICD-10-CM | POA: Diagnosis not present

## 2019-08-08 DIAGNOSIS — R5383 Other fatigue: Secondary | ICD-10-CM

## 2019-08-08 DIAGNOSIS — L539 Erythematous condition, unspecified: Secondary | ICD-10-CM | POA: Diagnosis not present

## 2019-08-08 NOTE — Progress Notes (Signed)
Subjective:  Patient ID: Brandi [name], female    DOB: [birth year]  Age: 85 y.o. MRN: [id number]  CC: The primary encounter diagnosis was Essential hypertension. Diagnoses of Long-term current use of high risk medication other than anticoagulant, Fatigue, unspecified type, and Erythema of toe were also pertinent to this visit.  HPI Brandi [name] presents for evaluation of red toes  .This visit occurred during the SARS-CoV-2 public health emergency.  Safety protocols were in place, including screening questions prior to the visit, additional usage of staff PPE, and extensive cleaning of exam room while observing appropriate contact time as indicated for disinfecting solutions.   Patient presents with painless erythema of the first 3 toes on each foot . She first noticed the discoloration about one month ago and decided it  was gout.  However she denies all pain and had no history of gout.   Outpatient Medications Prior to Visit  Medication Sig Dispense Refill  . aspirin 81 MG tablet Takes three times a week    . ezetimibe (ZETIA) 10 MG tablet TAKE 1 TABLET BY MOUTH  DAILY 90 tablet 3  . furosemide (LASIX) 20 MG tablet TAKE 1 TABLET BY MOUTH  DAILY AS NEEDED FOR FLUID  RETENTION 90 tablet 2  . Multiple Vitamin (MULTIVITAMIN) tablet Take 1 tablet by mouth daily.      . potassium chloride (K-DUR,KLOR-CON) 10 MEQ tablet TAKE 1 TABLET BY MOUTH  DAILY 90 tablet 3  . amLODipine (NORVASC) 5 MG tablet TAKE 1 TABLET BY MOUTH  DAILY (Patient not taking: Reported on 08/08/2019) 90 tablet 0   No facility-administered medications prior to visit.    Review of Systems;  Patient denies headache, fevers, malaise, unintentional weight loss, skin rash, eye pain, sinus congestion and sinus pain, sore throat, dysphagia,  hemoptysis , cough, dyspnea, wheezing, chest pain, palpitations, orthopnea, edema, abdominal pain, nausea, melena, diarrhea, constipation, flank pain, dysuria, hematuria, urinary  Frequency,  nocturia, numbness, tingling, seizures,  Focal weakness, Loss of consciousness,  Tremor, insomnia, depression, anxiety, and suicidal ideation.      Objective:  BP 136/60 (BP Location: Left Arm, Patient Position: Sitting, Cuff Size: Normal)   Pulse 74   Temp (!) 96 F (35.6 C) (Temporal)   Resp 15   Ht 4\' 11"  (1.499 m)   Wt 110 lb (49.9 kg)   SpO2 98%   BMI 22.22 kg/m   BP Readings from Last 3 Encounters:  08/08/19 136/60  05/10/19 (!) 114/56  11/03/18 (!) 136/58    Wt Readings from Last 3 Encounters:  08/08/19 110 lb (49.9 kg)  05/10/19 109 lb 3.2 oz (49.5 kg)  11/03/18 108 lb 9.6 oz (49.3 kg)    General appearance: alert, cooperative and appears stated age Ears: normal TM's and external ear canals both ears Throat: lips, mucosa, and tongue normal; teeth and gums normal Neck: no adenopathy, no carotid bruit, supple, symmetrical, trachea midline and thyroid not enlarged, symmetric, no tenderness/mass/nodules Back: symmetric, no curvature. ROM normal. No CVA tenderness. Lungs: clear to auscultation bilaterally Heart: regular rate and rhythm, S1, S2 normal, no murmur, click, rub or gallop Abdomen: soft, non-tender; bowel sounds normal; no masses,  no organomegaly Pulses: 2+ and symmetric w/r/t TA and DP pulses  Skin: distal toe erythema  turgor normal. No rashes or lesions Lymph nodes: Cervical, supraclavicular, and axillary nodes normal.  Lab Results  Component Value Date   HGBA1C 5.5 05/22/2017    Lab Results  Component Value Date   CREATININE  0.92 05/11/2019   CREATININE 0.89 11/03/2018   CREATININE 0.88 05/05/2018    Lab Results  Component Value Date   WBC 6.1 08/23/2015   HGB 14.1 08/23/2015   HCT 43.0 08/23/2015   PLT 239.0 08/23/2015   GLUCOSE 84 05/11/2019   CHOL 173 05/11/2019   TRIG 61.0 05/11/2019   HDL 59.70 05/11/2019   LDLDIRECT 91.0 10/31/2016   LDLCALC 101 (H) 05/11/2019   ALT 17 05/11/2019   AST 33 05/11/2019   NA 137 05/11/2019   K 4.3  05/11/2019   CL 101 05/11/2019   CREATININE 0.92 05/11/2019   BUN 20 05/11/2019   CO2 30 05/11/2019   TSH 1.56 05/11/2019   HGBA1C 5.5 05/22/2017    DG Bone Density  Result Date: 06/08/2018 EXAM: DUAL X-RAY ABSORPTIOMETRY (DXA) FOR BONE MINERAL DENSITY IMPRESSION: jbh Your patient Brandi [name] completed a BMD test on 06/08/2018 using the [employer] iDXA DXA System (analysis version: 14.10) manufactured by [employer]. The following summarizes the results of our evaluation. PATIENT BIOGRAPHICAL: Name: Brandi, [name] Patient ID: [id number] Birth Date: June 24, 1927 Height: 59.0 in. Gender: Female Exam Date: 06/08/2018 Weight: 107.4 lbs. Indications: Advanced Age, History of Fracture (Adult), Postmenopausal Fractures: Right elbow Treatments: Calcitonin, Calcium ASSESSMENT: The BMD measured at AP Spine L1-L2 is 0.625 g/cm2 with a T-score of -4.5. This patient is considered osteoporotic according to World Health Organization [facility]) criteria. L-3 & 4 were excluded due to degenerative changes. The quality of the scan is good. Site Region Measured Measured WHO Young Adult BMD Date       Age      Classification T-score AP Spine L1-L2 06/08/2018 88.8 Osteoporosis -4.5 0.625 g/cm2 AP Spine L1-L2 08/26/2011 82.0 Osteoporosis -4.3 0.656 g/cm2 AP Spine L1-L2 08/26/2011 82.0 Osteoporosis -4.1 0.674 g/cm2 DualFemur Neck Left 06/08/2018 88.8 Osteoporosis -3.3 0.578 g/cm2 DualFemur Neck Left 08/26/2011 82.0 Osteoporosis -2.7 0.659 g/cm2 DualFemur Neck Left 08/26/2011 82.0 Osteoporosis -2.8 0.651 g/cm2 World Health Organization [facility]) criteria for post-menopausal, Caucasian Women: Normal:       T-score at or above -1 SD Osteopenia:   T-score between -1 and -2.5 SD Osteoporosis: T-score at or below -2.5 SD RECOMMENDATIONS: 1. All patients should optimize calcium and vitamin D intake. 2. Consider FDA-approved medical therapies in postmenopausal women and men aged 66 years and older, based on the following: a. A hip or vertebral(clinical  or morphometric) fracture b. T-score < -2.5 at the femoral neck or spine after appropriate evaluation to exclude secondary causes c. Low bone mass (T-score between -1.0 and -2.5 at the femoral neck or spine) and a 10-year probability of a hip fracture > 3% or a 10-year probability of a major osteoporosis-related fracture > 20% based on the [place]-adapted WHO algorithm d. Clinician judgment and/or patient preferences may indicate treatment for people with 10-year fracture probabilities above or below these levels FOLLOW-UP: People with diagnosed cases of osteoporosis or at high risk for fracture should have regular bone mineral density tests. For patients eligible for Medicare, routine testing is allowed once every 2 years. The testing frequency can be increased to one year for patients who have rapidly progressing disease, those who are receiving or discontinuing medical therapy to restore bone mass, or have additional risk factors. I have reviewed this report, and agree with the above findings. [facility] Radiology Electronically Signed   By: Myles Rosenthal M.D.   On: 06/08/2018 10:08    Assessment & Plan:   Problem List Items Addressed This Visit  Unprioritized   Erythema of toe    Reassurance given.  No signs of arterial macrocirculation issues or gout.  Advised to keep feet warm and elevate when possible, and resume use of compression stockings        Essential hypertension - Primary   Relevant Orders   Comprehensive metabolic panel   Long-term current use of high risk medication other than anticoagulant    Other Visit Diagnoses    Fatigue, unspecified type       Relevant Orders   TSH   CBC with Differential/Platelet     A total of 25 minutes of face to face time was spent with patient more than half of which was spent in counselling about the above mentioned conditions  and coordination of care  I am having Antionette Poles. Klipfel maintain her multivitamin, aspirin, potassium chloride, furosemide,  amLODipine, and ezetimibe.  No orders of the defined types were placed in this encounter.   There are no discontinued medications.  Follow-up: Return in about 6 months (around 02/06/2020).   Crecencio Mc, MD

## 2019-08-09 DIAGNOSIS — L539 Erythematous condition, unspecified: Secondary | ICD-10-CM | POA: Insufficient documentation

## 2019-08-09 NOTE — Assessment & Plan Note (Addendum)
Reassurance given.  No signs of arterial macrocirculation issues or gout.  Advised to keep feet warm and elevate when possible, and resume use of compression stockings

## 2019-08-24 ENCOUNTER — Telehealth: Payer: Self-pay | Admitting: Internal Medicine

## 2019-08-24 NOTE — Telephone Encounter (Signed)
Patient has been scheduled for Prolia injection on 08/31/19

## 2019-08-30 NOTE — Progress Notes (Signed)
Cardiology Office Note  Date:  08/31/2019   ID:  Brandi [name], DOB 10/18/1926, MRN [id number]  PCP:  [name], MD   Chief Complaint  Patient presents with  . office visit    12 mo F/U-"Doing well"; Meds verbally reviewed with patient.    HPI:  86 year old woman with  anterior leaflet MVP and moderate to severe MR, in 2018 Mild to moderate  aortic valve stenosis Moderate to severe TR, RVSP 54 in 2018 Hyperlipidemia Lives at [facility] who presents for routine followup of her mitral and aortic valve disease.  Recent results discussed with her Echo 07/2018 Left ventricle: The cavity size was normal. Wall thickness was   increased in a pattern of mild LVH. Systolic function was   vigorous. The estimated ejection fraction was in the range of 65%   to 70%.  - Aortic valve:  mild stenosis. - Mitral valve:  Mild prolapse. mild stenosis.  moderate to   severe regurgitation directed posteriorly. - Left atrium: The atrium was severely dilated. - Pulmonary arteries: Systolic pressure was moderately increased,   in the range of 40 mm Hg to 45 mm Hg plus central venous/right   atrial pressure.  In general she reports that she has been doing well Walking one mile Home exercises, on computer long hours  She does report some tachypalpitations at night times, seems to come and go Denies having excessive leg swelling or shortness of breath She has not required extra diuretic  EKG personally reviewed by myself on todays visit  New arrhythmia, atrial flutter Rate 106 BPM, PVCs  Other past medical history reviewed  Previously declined further evaluation or surgery of her mitral valve  2-D echocardiogram May 08, 2009 showed normal left ventricular systolic function. Bileaflet mitral valve prolapse with moderate mitral regurgitation.    PMH:   has a past medical history of Hyperlipidemia, Mitral regurgitation, and Mitral valve prolapse.  PSH:    Past Surgical History:   Procedure Laterality Date  . BIOPSY BREAST  1980   left ,  benign  . BREAST BIOPSY  1980   normal  . Hospitalized for endocarditis  1987    Current Outpatient Medications  Medication Sig Dispense Refill  . amLODipine (NORVASC) 5 MG tablet TAKE 1 TABLET BY MOUTH  DAILY 90 tablet 0  . aspirin 81 MG tablet Takes three times a week    . ezetimibe (ZETIA) 10 MG tablet TAKE 1 TABLET BY MOUTH  DAILY 90 tablet 3  . furosemide (LASIX) 20 MG tablet TAKE 1 TABLET BY MOUTH  DAILY AS NEEDED FOR FLUID  RETENTION 90 tablet 2  . Multiple Vitamin (MULTIVITAMIN) tablet Take 1 tablet by mouth daily.      . potassium chloride (K-DUR,KLOR-CON) 10 MEQ tablet TAKE 1 TABLET BY MOUTH  DAILY 90 tablet 3   No current facility-administered medications for this visit.     Allergies:   Codeine, Statins, and Sulfonamide derivatives   Social History:  The patient  reports that she has never smoked. She has never used smokeless tobacco. She reports that she does not drink alcohol or use drugs.   Family History:   family history includes Cancer in her sister; Coronary artery disease in her father; Heart attack in her father; Heart disease in her brother.    Review of Systems: Review of Systems  Constitutional: Negative.   Respiratory: Negative.   Cardiovascular: Positive for palpitations.       Tachycardia  Gastrointestinal:  Negative.   Musculoskeletal: Negative.   Neurological: Negative.   Psychiatric/Behavioral: Negative.   All other systems reviewed and are negative.   PHYSICAL EXAM: VS:  BP 140/74 (BP Location: Left Arm, Patient Position: Sitting, Cuff Size: Normal)   Pulse (!) 106   Ht 4\' 11"  (1.499 m)   Wt 108 lb (49 kg)   SpO2 98%   BMI 21.81 kg/m  , BMI Body mass index is 21.81 kg/m. Constitutional:  oriented to person, place, and time. No distress.  HENT:  Head: Grossly normal Eyes:  no discharge. No scleral icterus.  Neck: No JVD, no carotid bruits  Cardiovascular: Regular rate and  rhythm, no murmurs appreciated Pulmonary/Chest: Clear to auscultation bilaterally, no wheezes or rails Abdominal: Soft.  no distension.  no tenderness.  Musculoskeletal: Normal range of motion Neurological:  normal muscle tone. Coordination normal. No atrophy Skin: Skin warm and dry Psychiatric: normal affect, pleasant  Recent Labs: 05/11/2019: ALT 17; BUN 20; Creatinine, Ser 0.92; Potassium 4.3; Sodium 137; TSH 1.56    Lipid Panel Lab Results  Component Value Date   CHOL 173 05/11/2019   HDL 59.70 05/11/2019   LDLCALC 101 (H) 05/11/2019   TRIG 61.0 05/11/2019      Wt Readings from Last 3 Encounters:  08/31/19 108 lb (49 kg)  08/08/19 110 lb (49.9 kg)  05/10/19 109 lb 3.2 oz (49.5 kg)     ASSESSMENT AND PLAN:  Atrial flutter New onset, discussed with her in detail Recommend she start Eliquis 2.5 twice daily Metoprolol succinate 50 mg daily for rate control Stop amlodipine Recommend she take Lasix as needed for leg swelling Zio monitor placed for atrial flutter as she reports symptoms seem to come and go and not persistent  Mixed hyperlipidemia Tolerating Zetia  Chronic venous insufficiency  recommend compression hose  MITRAL REGURGITATION Mitral valve prolapse Moderate to severe MR,  Previously declined surgery  Aortic valve stenosis Mild aortic valve stenosis on recent echocardiogram  Pulmonary HTN Continue exercise as needed for leg swelling She prefers not to take this on a daily basis  Disposition:   F/U  1 month  Long discussion concerning new atrial flutter, management, etiology  Total encounter time more than 45 minutes  Greater than 50% was spent in counseling and coordination of care with the patient    Orders Placed This Encounter  Procedures  . EKG 12-Lead     Signed, Esmond Plants, M.D., Ph.D. 08/31/2019  [place], [place]

## 2019-08-31 ENCOUNTER — Other Ambulatory Visit: Payer: Self-pay

## 2019-08-31 ENCOUNTER — Ambulatory Visit (INDEPENDENT_AMBULATORY_CARE_PROVIDER_SITE_OTHER): Payer: Medicare Other

## 2019-08-31 ENCOUNTER — Ambulatory Visit (INDEPENDENT_AMBULATORY_CARE_PROVIDER_SITE_OTHER): Payer: Medicare Other | Admitting: Cardiovascular Disease

## 2019-08-31 ENCOUNTER — Encounter: Payer: Self-pay | Admitting: Cardiovascular Disease

## 2019-08-31 VITALS — BP 140/74 | HR 106 | Ht 59.0 in | Wt 108.0 lb

## 2019-08-31 VITALS — Temp 95.1°F

## 2019-08-31 DIAGNOSIS — I272 Pulmonary hypertension, unspecified: Secondary | ICD-10-CM

## 2019-08-31 DIAGNOSIS — I350 Nonrheumatic aortic (valve) stenosis: Secondary | ICD-10-CM

## 2019-08-31 DIAGNOSIS — I4892 Unspecified atrial flutter: Secondary | ICD-10-CM | POA: Diagnosis not present

## 2019-08-31 DIAGNOSIS — M810 Age-related osteoporosis without current pathological fracture: Secondary | ICD-10-CM | POA: Diagnosis not present

## 2019-08-31 DIAGNOSIS — I080 Rheumatic disorders of both mitral and aortic valves: Secondary | ICD-10-CM

## 2019-08-31 DIAGNOSIS — I872 Venous insufficiency (chronic) (peripheral): Secondary | ICD-10-CM | POA: Diagnosis not present

## 2019-08-31 DIAGNOSIS — E782 Mixed hyperlipidemia: Secondary | ICD-10-CM

## 2019-08-31 MED ORDER — APIXABAN 2.5 MG PO TABS: 2.5000 mg | ORAL_TABLET | Freq: Two times a day (BID) | ORAL | 11 refills | Status: DC

## 2019-08-31 MED ORDER — DENOSUMAB 60 MG/ML ~~LOC~~ SOSY
60.0000 mg | PREFILLED_SYRINGE | Freq: Once | SUBCUTANEOUS | Status: AC
  Administered 2019-08-31: 15:00:00 60 mg via SUBCUTANEOUS

## 2019-08-31 MED ORDER — METOPROLOL SUCCINATE ER 50 MG PO TB24: 50.0000 mg | ORAL_TABLET | Freq: Every day | ORAL | 3 refills | Status: DC

## 2019-08-31 NOTE — Patient Instructions (Addendum)
Zio monitor for atrial flutter   Medication Instructions:  Your physician has recommended you make the following change in your medication:  1. STOP Amlodipine 2. STOP Aspirin 3. START Metoprolol succinate 50 mg once daily with food (rate control) 4. START Eliquis 2.5 mg twice a day 5. Furosemide as needed for shortness of breath, leg swelling, weight gain, or abdominal bloating.    If you need a refill on your cardiac medications before your next appointment, please call your pharmacy.    Lab work: No new labs needed   If you have labs (blood work) drawn today and your tests are completely normal, you will receive your results only by: [name] MyChart Message (if you have MyChart) OR . A paper copy in the mail If you have any lab test that is abnormal or we need to change your treatment, we will call you to review the results.   Testing/Procedures: Your physician has recommended that you wear a Zio monitor. This monitor is a medical device that records the heart's electrical activity. Doctors most often use these monitors to diagnose arrhythmias. Arrhythmias are problems with the speed or rhythm of the heartbeat. The monitor is a small device applied to your chest. You can wear one while you do your normal daily activities. While wearing this monitor if you have any symptoms to push the button and record what you felt. Once you have worn this monitor for the period of time provider prescribed (Usually 14 days), you will return the monitor device in the postage paid box. Once it is returned they will download the data collected and provide [place] with a report which the provider will then review and we will call you with those results. Important tips:  1. Avoid showering during the first 24 hours of wearing the monitor. 2. Avoid excessive sweating to help maximize wear time. 3. Do not submerge the device, no hot tubs, and no swimming pools. 4. Keep any lotions or oils away from the  patch. 5. After 24 hours you may shower with the patch on. Take brief showers with your back facing the shower head.  6. Do not remove patch once it has been placed because that will interrupt data and decrease adhesive wear time. 7. Push the button when you have any symptoms and write down what you were feeling. 8. Once you have completed wearing your monitor, remove and place into box which has postage paid and place in your outgoing mailbox.  9. If for some reason you have misplaced your box then call our office and we can provide another box and/or mail it off for you.         Follow-Up: At [facility], you and your health needs are our priority.  As part of our continuing mission to provide you with exceptional heart care, we have created designated Provider Care Teams.  These Care Teams include your primary Cardiologist (physician) and Advanced Practice Providers (APPs -  Physician Assistants and Nurse Practitioners) who all work together to provide you with the care you need, when you need it.  . You will need a follow up appointment in 1 month .   [name] Providers on your designated Care Team:   . Nicolasa Ducking, NP . Eula Listen, PA-C . Marisue Ivan, PA-C  Any Other Special Instructions Will Be Listed Below (If Applicable).  For educational health videos Log in to : [email or web address] Or : [email or web address], password : [email or web address]

## 2019-08-31 NOTE — Progress Notes (Signed)
Patient came in today for prolia injection in right arm, SQ. Patient tolerated well.

## 2019-09-01 DIAGNOSIS — Z23 Encounter for immunization: Secondary | ICD-10-CM | POA: Diagnosis not present

## 2019-09-08 ENCOUNTER — Telehealth: Payer: Self-pay | Admitting: Cardiovascular Disease

## 2019-09-08 NOTE — Telephone Encounter (Signed)
Pt c/o medication issue:  1. Name of Medication: Eliquis and metoprolol  2. How are you currently taking this medication (dosage and times per day)? 2.5 bid for eliquis and 50 mg daily for metoprolol. Patient has stopped medications as of 1/20  3. Are you having a reaction (difficulty breathing--STAT)? SOB when taking medications.   4. What is your medication issue? she has been taking for two weeks, she stopped the medication yesterday. Patient has not been able to walk far, and patient usally walks a mile everyday, patient also have nausea that comes and goes   Please advise

## 2019-09-08 NOTE — Telephone Encounter (Signed)
Spoke with patient and she has not taken her Eliquis or metoprolol in 2 days due to shortness of breath. Reviewed her furosemide and that would be appropriate to take that instead of holding her medications. Explained what each medication was for and risks associated with holding any medications. She verbalized understanding and was agreeable to restart medications and try furosemide if shortness of breath returns. Long discussion regarding medications and she verbalized understanding. Instructed her to please call back if she should have any further problems and she was agreeable with plan.

## 2019-09-10 ENCOUNTER — Other Ambulatory Visit: Payer: Self-pay | Admitting: Cardiovascular Disease

## 2019-09-12 ENCOUNTER — Telehealth: Payer: Self-pay | Admitting: Cardiovascular Disease

## 2019-09-12 ENCOUNTER — Telehealth: Payer: Self-pay | Admitting: Internal Medicine

## 2019-09-12 NOTE — Telephone Encounter (Signed)
Spoke with patient and she said that she was so upset over throwing out the box. Advised that it was fine and that she could bring the monitor in and we could mail it for her. She reports she must wear it 2 more days and then will bring it into the office. She was appreciative for the call back with no further questions at this time.

## 2019-09-12 NOTE — Telephone Encounter (Signed)
Please call regarding heart monitor. States she does not have the box to return the monitor. Please call to discuss.

## 2019-09-12 NOTE — Telephone Encounter (Signed)
Left message for patient to call back and schedule Medicare Annual Wellness Visit (AWV) either virtually/audio only.  Last AWV 1.24.20; please schedule at anytime with Denisa O'Brien-Blaney at [facility] for [facility] to schedule

## 2019-09-15 ENCOUNTER — Other Ambulatory Visit: Payer: Self-pay

## 2019-09-15 ENCOUNTER — Ambulatory Visit (INDEPENDENT_AMBULATORY_CARE_PROVIDER_SITE_OTHER): Payer: Medicare Other

## 2019-09-15 VITALS — Ht 59.0 in | Wt 108.0 lb

## 2019-09-15 DIAGNOSIS — Z0000 Encounter for general adult medical examination without abnormal findings: Secondary | ICD-10-CM | POA: Diagnosis not present

## 2019-09-15 NOTE — Progress Notes (Addendum)
Subjective:   Brandi [name] is a 86 y.o. female who presents for Medicare Annual (Subsequent) preventive examination.  Review of Systems:  No ROS.  Medicare Wellness Virtual Visit.  Visual/audio telehealth visit, UTA vital signs.   Ht/Wt provided. See social history for additional risk factors.   Cardiac Risk Factors include: advanced age (>7men, >31 women);hypertension     Objective:     Vitals: Ht 4\' 11"  (1.499 m)   Wt 108 lb (49 kg)   BMI 21.81 kg/m   Body mass index is 21.81 kg/m.  Advanced Directives 09/15/2019 09/10/2018 09/09/2017 09/05/2016 09/06/2015  Does Patient Have a Medical Advance Directive? Yes Yes Yes Yes Yes  Type of 09/08/2015 of [place];Living will Healthcare Power of [place];Living will Healthcare Power of [place];Living will Living will;Healthcare Power of [place] Power of [place];Living will  Does patient want to make changes to medical advance directive? No - Patient declined No - Patient declined No - Patient declined No - Patient declined No - Patient declined  Copy of Healthcare Power of Attorney in Chart? No - copy requested No - copy requested No - copy requested No - copy requested No - copy requested    Tobacco Social History   Tobacco Use  Smoking Status Never Smoker  Smokeless Tobacco Never Used     Counseling given: Not Answered   Clinical Intake:  Pre-visit preparation completed: Yes        Diabetes: No  How often do you need to have someone help you when you read instructions, pamphlets, or other written materials from your doctor or pharmacy?: 1 - Never  Interpreter Needed?: No     Past Medical History:  Diagnosis Date  . Hyperlipidemia   . Mitral regurgitation   . Mitral valve prolapse    Past Surgical History:  Procedure Laterality Date  . BIOPSY BREAST  1980   left ,  benign  . BREAST BIOPSY  1980   normal  . Hospitalized for endocarditis  1987   Family History  Problem  Relation Age of Onset  . Heart attack Father   . Coronary artery disease Father   . Cancer Sister        esophageal  . Heart disease Brother    Social History   Socioeconomic History  . Marital status: Widowed    Spouse name: Not on file  . Number of children: 0  . Years of education: Not on file  . Highest education level: Not on file  Occupational History  . Occupation: retired  Tobacco Use  . Smoking status: Never Smoker  . Smokeless tobacco: Never Used  Substance and Sexual Activity  . Alcohol use: No  . Drug use: No  . Sexual activity: Not Currently  Other Topics Concern  . Not on file  Social History Narrative   Lives at [facility]   Exercise, walks 7 days a week with aerobic exercise 2 days a week      Social Determinants of Health   Financial Resource Strain: Low Risk   . Difficulty of Paying Living Expenses: Not hard at all  Food Insecurity: No Food Insecurity  . Worried About [place] in the Last Year: Never true  . Ran Out of Food in the Last Year: Never true  Transportation Needs: No Transportation Needs  . Lack of Transportation (Medical): No  . Lack of Transportation (Non-Medical): No  Physical Activity: Sufficiently Active  . Days of Exercise per  Week: 7 days  . Minutes of Exercise per Session: 30 min  Stress: No Stress Concern Present  . Feeling of Stress : Not at all  Social Connections: Unknown  . Frequency of Communication with Friends and Family: More than three times a week  . Frequency of Social Gatherings with Friends and Family: Once a week  . Attends Religious Services: Not on file  . Active Member of Clubs or Organizations: Yes  . Attends Banker Meetings: Not on file  . Marital Status: Widowed    Outpatient Encounter Medications as of 09/15/2019  Medication Sig  . apixaban (ELIQUIS) 2.5 MG TABS tablet Take 1 tablet (2.5 mg total) by mouth 2 (two) times daily.  [name] ezetimibe (ZETIA) 10 MG tablet TAKE 1 TABLET BY  MOUTH  DAILY  . furosemide (LASIX) 20 MG tablet TAKE 1 TABLET BY MOUTH  DAILY AS NEEDED FOR FLUID  RETENTION  . metoprolol succinate (TOPROL-XL) 50 MG 24 hr tablet Take 1 tablet (50 mg total) by mouth daily. Take with or immediately following a meal.  . Multiple Vitamin (MULTIVITAMIN) tablet Take 1 tablet by mouth daily.    . potassium chloride (K-DUR,KLOR-CON) 10 MEQ tablet TAKE 1 TABLET BY MOUTH  DAILY   No facility-administered encounter medications on file as of 09/15/2019.    Activities of Daily Living In your present state of health, do you have any difficulty performing the following activities: 09/15/2019  Hearing? Y  Vision? N  Difficulty concentrating or making decisions? N  Walking or climbing stairs? N  Dressing or bathing? N  Doing errands, shopping? N  Preparing Food and eating ? N  Using the Toilet? N  In the past six months, have you accidently leaked urine? N  Do you have problems with loss of bowel control? N  Managing your Medications? N  Managing your Finances? Y  Comment Managed by Catering manager or managing your Housekeeping? N  Some recent data might be hidden    Patient Care Team: [name], MD as PCP - General (Internal Medicine) Antonieta Iba, MD as Consulting Physician (Cardiology)    Assessment:   This is a routine wellness examination for Brandi [name].  Nurse connected with patient 09/15/19 at 10:30 AM EST by a telephone enabled telemedicine application and verified that I am speaking with the correct person using two identifiers. Patient stated full name and DOB. Patient gave permission to continue with virtual visit. Patient's location was at home and Nurse's location was at [place] office.   Patient is alert and oriented x3. Patient denies difficulty focusing or concentrating. Patient likes to read often and completes crossword puzzles for brain stimulation.   Health Maintenance Due: -Urine microalbumin- followed by pcp See  completed HM at the end of note.   Eye: Visual acuity not assessed. Virtual visit. Followed by their ophthalmologist.  Dental: Visits every 6 months.    Hearing: Hearing aids- yes, L ear deafness, R ear hearing aid  Safety:  Patient feels safe at home- yes Patient does have smoke detectors at home- yes Patient does wear sunscreen or protective clothing when in direct sunlight - yes Patient does wear seat belt when in a moving vehicle - yes Patient drives- yes Adequate lighting in walkways free from debris- yes Grab bars and handrails used as appropriate- yes Ambulates with an assistive device- no Cell phone on person when ambulating outside of the home- yes  Social: Alcohol intake - no  Smoking history- never   Smokers in home? none Illicit drug use? none  Medication: Taking as directed and without issues.  Self managed - yes   Covid-19: Precautions and sickness symptoms discussed. Wears mask, social distancing, hand hygiene as appropriate.   Activities of Daily Living Patient denies needing assistance with: household chores, feeding themselves, getting from bed to chair, getting to the toilet, bathing/showering, dressing, managing money, or preparing meals.   Discussed the importance of a healthy diet, water intake and the benefits of aerobic exercise.   Physical activity- walking daily 1 mile daily and aerobic exercise.  Diet:  Healthy Water: good intake Caffeine: 2 cups of coffee  Other Providers Patient Care Team: [name], MD as PCP - General (Internal Medicine) Mariah Milling Tollie Pizza, MD as Consulting Physician (Cardiology)  Exercise Activities and Dietary recommendations Current Exercise Habits: Home exercise routine, Type of exercise: walking, Frequency (Times/Week): 7, Intensity: Moderate  Goals    . Maintain Healthy Lifestyle     Stay active with walking Healthy diet Stay hydrated Continue with brain engaging activities       Fall  Risk Fall Risk  09/15/2019 08/08/2019 05/10/2019 09/10/2018 05/05/2018  Falls in the past year? 0 0 0 0 No  Follow up Falls evaluation completed Falls evaluation completed Falls evaluation completed - -   Timed Get Up and Go performed: no, virtual visit  Depression Screen PHQ 2/9 Scores 09/15/2019 09/10/2018 05/05/2018 09/09/2017  PHQ - 2 Score 0 0 0 0     Cognitive Function MMSE - Mini Mental State Exam 09/05/2016 09/06/2015  Orientation to time 5 5  Orientation to Place 5 5  Registration 3 3  Attention/ Calculation 5 5  Recall 3 3  Language- name 2 objects 2 2  Language- repeat 1 1  Language- follow 3 step command 3 3  Language- read & follow direction 1 1  Write a sentence 1 1  Copy design 1 1  Total score 30 30     6CIT Screen 09/15/2019 09/10/2018 09/09/2017  What Year? 0 points 0 points 0 points  What month? 0 points 0 points 0 points  What time? 0 points 0 points 0 points  Count back from 20 0 points 0 points 0 points  Months in reverse 0 points 0 points 0 points  Repeat phrase - 0 points -  Total Score - 0 -    Immunization History  Administered Date(s) Administered  . 19-influenza Whole 06/11/2017  . Fluad Quad(high Dose 65+) 05/10/2019  . H1N1 10/12/2008  . Influenza Split 06/17/2011  . Influenza Whole 06/18/2008, 06/18/2009, 05/18/2010, 06/17/2010  . Influenza-Unspecified 05/18/2013, 05/23/2014, 06/18/2015, 06/03/2018  . Pneumococcal Conjugate-13 08/21/2014  . Pneumococcal Polysaccharide-23 08/18/1993, 07/28/2013  . Td 08/18/2005  . Tdap 12/27/2010  . Zoster 08/02/2010  . Zoster Recombinat (Shingrix) 09/21/2017, 06/16/2018   Screening Tests Health Maintenance  Topic Date Due  . URINE MICROALBUMIN  10/16/2019 (Originally 08/12/1937)  . OPHTHALMOLOGY EXAM  12/24/2019  . TETANUS/TDAP  12/26/2020  . INFLUENZA VACCINE  Completed  . DEXA SCAN  Completed  . PNA vac Low Risk Adult  Completed      Plan:   Keep all routine maintenance appointments.   Next  scheduled lab 02/06/20.  Follow up 02/08/20.  Medicare Attestation I have personally reviewed: The patient's medical and social history Their use of alcohol, tobacco or illicit drugs Their current medications and supplements The patient's functional ability including ADLs,fall risks, home safety risks, cognitive, and hearing and visual  impairment Diet and physical activities Evidence for depression   I have reviewed and discussed with patient certain preventive protocols, quality metrics, and best practice recommendations.     Varney Biles, LPN  12/09/5359   Agree  TMS

## 2019-09-15 NOTE — Patient Instructions (Addendum)
  Ms. Reddin , Thank you for taking time to come for your Medicare Wellness Visit. I appreciate your ongoing commitment to your health goals. Please review the following plan we discussed and let me know if I can assist you in the future.   These are the goals we discussed: Goals    . Maintain Healthy Lifestyle     Stay active with walking Healthy diet Stay hydrated Continue with brain engaging activities       This is a list of the screening recommended for you and due dates:  Health Maintenance  Topic Date Due  . Urine Protein Check  10/16/2019*  . Eye exam for diabetics  12/24/2019  . Tetanus Vaccine  12/26/2020  . Flu Shot  Completed  . DEXA scan (bone density measurement)  Completed  . Pneumonia vaccines  Completed  *Topic was postponed. The date shown is not the original due date.

## 2019-09-16 NOTE — Telephone Encounter (Signed)
Patient dropped off monitor  Placed on Pam's desk

## 2019-09-19 NOTE — Telephone Encounter (Signed)
Paper placed in box with monitor with patients name, DOB, MRN, and monitor number. Sealed and placed in outgoing mail.

## 2019-09-20 ENCOUNTER — Other Ambulatory Visit: Payer: Self-pay | Admitting: *Deleted

## 2019-09-20 MED ORDER — POTASSIUM CHLORIDE CRYS ER 10 MEQ PO TBCR: 10.0000 meq | EXTENDED_RELEASE_TABLET | Freq: Every day | ORAL | 0 refills | Status: DC

## 2019-09-20 MED ORDER — METOPROLOL SUCCINATE ER 50 MG PO TB24: 50.0000 mg | ORAL_TABLET | Freq: Every day | ORAL | 0 refills | Status: DC

## 2019-09-20 MED ORDER — APIXABAN 2.5 MG PO TABS: 2.5000 mg | ORAL_TABLET | Freq: Two times a day (BID) | ORAL | 2 refills | Status: DC

## 2019-09-20 NOTE — Telephone Encounter (Signed)
Eliquis 2.5mg  refill request received. Patient is 86 yrs old, weight-49kg, Crea-0.92 on 05/11/2019, Diagnosis-Aflutter, and last seen by Dr. Mariah Milling on 08/31/2019. Dose is appropriate based on dosing criteria. Will send in refill to requested pharmacy.

## 2019-09-23 ENCOUNTER — Other Ambulatory Visit: Payer: Self-pay | Admitting: Cardiovascular Disease

## 2019-09-29 DIAGNOSIS — Z23 Encounter for immunization: Secondary | ICD-10-CM | POA: Diagnosis not present

## 2019-09-30 DIAGNOSIS — I4892 Unspecified atrial flutter: Secondary | ICD-10-CM | POA: Diagnosis not present

## 2019-10-01 NOTE — Progress Notes (Signed)
Cardiology Office Note  Date:  10/03/2019   ID:  Brandi [name], DOB Jan 07, 1927, MRN [id number]  PCP:  Crecencio Mc, MD   Chief Complaint  Patient presents with  . office visit    1 month F/U after wearing ZIO monitor; Meds verbally reviewed with patient.    HPI:  86 year old woman with  anterior leaflet MVP and moderate to severe MR, in 2018 Mild to moderate  aortic valve stenosis Moderate to severe TR, RVSP 54 in 2018 Hyperlipidemia Lives at [facility] who presents for routine followup of her mitral and aortic valve disease,l atrial fibrillation  Feels well, EKG in NSR today Recent event monitor was 100% atrial fib Only sx was tachy palpitations at night in bed  Event monitor, discussed in detail Rhythm is atrial fibrillation/flutter, 100% burden ranging from 52-199 bpm (avg of 96 bpm). 4 Ventricular Tachycardia (unable to exclude atrial fibrillation with aberrancy) runs occurred, the run with the fastest interval lasting 9 beats with a max rate of 193 bpm, the longest lasting 10 beats with an avg rate of 163 bpm.  No patient triggered Isolated VEs were occasional (1.8%, 32223), VE Couplets were rare (<1.0%, 58), and VE Triplets were rare (<1.0%, 2). Ventricular Bigeminy and Trigeminy were present. Patient triggered events were not associated with significant arrhythmia  Echo 07/2018, discussed with her in detail Left ventricle: The cavity size was normal. Wall thickness was   increased in a pattern of mild LVH. Systolic function was   vigorous. The estimated ejection fraction was in the range of 65%   to 70%.  - Aortic valve:  mild stenosis. - Mitral valve:  Mild prolapse. mild stenosis.  moderate to   severe regurgitation directed posteriorly. - Left atrium: The atrium was severely dilated. - Pulmonary arteries: Systolic pressure was moderately increased,   in the range of 40 mm Hg to 45 mm Hg plus central venous/right   atrial pressure.  EKG personally reviewed  by myself on todays visit NSR with rate 53 bpm, PVC  Other past medical history reviewed Previously declined further evaluation or surgery of her mitral valve  2-D echocardiogram May 08, 2009 showed normal left ventricular systolic function. Bileaflet mitral valve prolapse with moderate mitral regurgitation.    PMH:   has a past medical history of Hyperlipidemia, Mitral regurgitation, and Mitral valve prolapse.  PSH:    Past Surgical History:  Procedure Laterality Date  . BIOPSY BREAST  1980   left ,  benign  . BREAST BIOPSY  1980   normal  . Hospitalized for endocarditis  1987    Current Outpatient Medications  Medication Sig Dispense Refill  . apixaban (ELIQUIS) 2.5 MG TABS tablet Take 1 tablet (2.5 mg total) by mouth 2 (two) times daily. 180 tablet 2  . ezetimibe (ZETIA) 10 MG tablet TAKE 1 TABLET BY MOUTH  DAILY 90 tablet 3  . furosemide (LASIX) 20 MG tablet TAKE 1 TABLET BY MOUTH  DAILY AS NEEDED FOR FLUID  RETENTION 90 tablet 2  . metoprolol succinate (TOPROL-XL) 50 MG 24 hr tablet Take 1 tablet (50 mg total) by mouth daily. Take with or immediately following a meal. 90 tablet 0  . Multiple Vitamin (MULTIVITAMIN) tablet Take 1 tablet by mouth daily.      . potassium chloride (KLOR-CON) 10 MEQ tablet Take 1 tablet (10 mEq total) by mouth daily. 90 tablet 0   No current facility-administered medications for this visit.     Allergies:  Codeine, Statins, and Sulfonamide derivatives   Social History:  The patient  reports that she has never smoked. She has never used smokeless tobacco. She reports that she does not drink alcohol or use drugs.   Family History:   family history includes Cancer in her sister; Coronary artery disease in her father; Heart attack in her father; Heart disease in her brother.    Review of Systems: Review of Systems  Constitutional: Negative.   HENT: Negative.   Respiratory: Negative.   Cardiovascular: Negative.   Gastrointestinal:  Negative.   Musculoskeletal: Negative.   Neurological: Negative.   Psychiatric/Behavioral: Negative.   All other systems reviewed and are negative.   PHYSICAL EXAM: VS:  BP 140/76 (BP Location: Left Arm, Patient Position: Sitting, Cuff Size: Normal)   Pulse (!) 53   Ht 5\' 1"  (1.549 m)   Wt 109 lb (49.4 kg)   SpO2 98%   BMI 20.60 kg/m  , BMI Body mass index is 20.6 kg/m. Constitutional:  oriented to person, place, and time. No distress.  HENT:  Head: Grossly normal Eyes:  no discharge. No scleral icterus.  Neck: No JVD, no carotid bruits  Cardiovascular: Regular rate and rhythm, 2-3/6 systolic ejection murmur left sternal border Pulmonary/Chest: Clear to auscultation bilaterally, no wheezes or rails Abdominal: Soft.  no distension.  no tenderness.  Musculoskeletal: Normal range of motion Neurological:  normal muscle tone. Coordination normal. No atrophy Skin: Skin warm and dry Psychiatric: normal affect, pleasant  Recent Labs: 05/11/2019: ALT 17; BUN 20; Creatinine, Ser 0.92; Potassium 4.3; Sodium 137; TSH 1.56    Lipid Panel Lab Results  Component Value Date   CHOL 173 05/11/2019   HDL 59.70 05/11/2019   LDLCALC 101 (H) 05/11/2019   TRIG 61.0 05/11/2019      Wt Readings from Last 3 Encounters:  10/03/19 109 lb (49.4 kg)  09/15/19 108 lb (49 kg)  08/31/19 108 lb (49 kg)     ASSESSMENT AND PLAN:  Atrial flutter She will continue on Eliquis, metoprolol succinate 50 daily, monitor pulse at home given some bradycardia today but she is asymptomatic Monitor pulse at home, running slow Recommend she take Lasix as needed for leg swelling  Mixed hyperlipidemia Tolerating Zetia  Chronic venous insufficiency  recommend compression hose  MITRAL REGURGITATION Doing walking around complex, not slowed down by SOB Mitral valve prolapse, known Moderate to severe MR, she is aware, discussed on today's visit Previously declined surgery.  Again she declined on today's  discussion Lasix as needed  Aortic valve stenosis Mild aortic valve stenosis on recent echocardiogram Periodic monitoring  Pulmonary HTN Continue exercise as needed for leg swelling Lasix as needed for swelling  Disposition:   F/U  3 month   Total encounter time more than 25 minutes  Greater than 50% was spent in counseling and coordination of care with the patient    Orders Placed This Encounter  Procedures  . EKG 12-Lead     Signed, 09/02/19, M.D., Ph.D. 10/03/2019  [facility] [place], [place]

## 2019-10-03 ENCOUNTER — Ambulatory Visit (INDEPENDENT_AMBULATORY_CARE_PROVIDER_SITE_OTHER): Payer: Medicare Other | Admitting: Cardiovascular Disease

## 2019-10-03 ENCOUNTER — Encounter: Payer: Self-pay | Admitting: Cardiovascular Disease

## 2019-10-03 ENCOUNTER — Other Ambulatory Visit: Payer: Self-pay

## 2019-10-03 VITALS — BP 140/76 | HR 53 | Ht 61.0 in | Wt 109.0 lb

## 2019-10-03 DIAGNOSIS — E782 Mixed hyperlipidemia: Secondary | ICD-10-CM | POA: Diagnosis not present

## 2019-10-03 DIAGNOSIS — I872 Venous insufficiency (chronic) (peripheral): Secondary | ICD-10-CM | POA: Diagnosis not present

## 2019-10-03 DIAGNOSIS — I4892 Unspecified atrial flutter: Secondary | ICD-10-CM

## 2019-10-03 DIAGNOSIS — I350 Nonrheumatic aortic (valve) stenosis: Secondary | ICD-10-CM

## 2019-10-03 DIAGNOSIS — I272 Pulmonary hypertension, unspecified: Secondary | ICD-10-CM | POA: Diagnosis not present

## 2019-10-03 DIAGNOSIS — I10 Essential (primary) hypertension: Secondary | ICD-10-CM | POA: Diagnosis not present

## 2019-10-03 DIAGNOSIS — I080 Rheumatic disorders of both mitral and aortic valves: Secondary | ICD-10-CM | POA: Diagnosis not present

## 2019-10-03 NOTE — Patient Instructions (Signed)

## 2019-10-24 ENCOUNTER — Telehealth: Payer: Self-pay | Admitting: Internal Medicine

## 2019-10-24 NOTE — Telephone Encounter (Signed)
Spoke with pt and she is scheduled for an office visit with you next Thursday.

## 2019-10-24 NOTE — Telephone Encounter (Signed)
Pt is calling to get a referral to [facility] ortho, she is having ongoing right hand pain.  Pt said that she has knots in her hand do to her breaking her elbow

## 2019-11-01 ENCOUNTER — Other Ambulatory Visit: Payer: Self-pay

## 2019-11-03 ENCOUNTER — Ambulatory Visit (INDEPENDENT_AMBULATORY_CARE_PROVIDER_SITE_OTHER): Payer: Medicare Other | Admitting: Internal Medicine

## 2019-11-03 ENCOUNTER — Other Ambulatory Visit: Payer: Self-pay

## 2019-11-03 ENCOUNTER — Ambulatory Visit (INDEPENDENT_AMBULATORY_CARE_PROVIDER_SITE_OTHER): Payer: Medicare Other

## 2019-11-03 ENCOUNTER — Encounter: Payer: Self-pay | Admitting: Internal Medicine

## 2019-11-03 VITALS — BP 130/74 | HR 46 | Temp 96.5°F | Resp 15 | Ht 61.0 in | Wt 106.2 lb

## 2019-11-03 DIAGNOSIS — M79641 Pain in right hand: Secondary | ICD-10-CM | POA: Diagnosis not present

## 2019-11-03 DIAGNOSIS — M25541 Pain in joints of right hand: Secondary | ICD-10-CM | POA: Diagnosis not present

## 2019-11-03 DIAGNOSIS — I4811 Longstanding persistent atrial fibrillation: Secondary | ICD-10-CM

## 2019-11-03 DIAGNOSIS — M720 Palmar fascial fibromatosis [Dupuytren]: Secondary | ICD-10-CM | POA: Diagnosis not present

## 2019-11-03 DIAGNOSIS — M7989 Other specified soft tissue disorders: Secondary | ICD-10-CM | POA: Diagnosis not present

## 2019-11-03 LAB — COMPREHENSIVE METABOLIC PANEL
ALT: 27 U/L (ref 0–35)
AST: 32 U/L (ref 0–37)
Albumin: 3.7 g/dL (ref 3.5–5.2)
Alkaline Phosphatase: 70 U/L (ref 39–117)
BUN: 21 mg/dL (ref 6–23)
CO2: 34 mEq/L — ABNORMAL HIGH (ref 19–32)
Calcium: 10 mg/dL (ref 8.4–10.5)
Chloride: 104 mEq/L (ref 96–112)
Creatinine, Ser: 1.03 mg/dL (ref 0.40–1.20)
GFR: 50.31 mL/min — ABNORMAL LOW (ref >60.00)
Glucose, Bld: 81 mg/dL (ref 70–99)
Potassium: 4.5 mEq/L (ref 3.5–5.1)
Sodium: 138 mEq/L (ref 135–145)
Total Bilirubin: 0.5 mg/dL (ref 0.2–1.2)
Total Protein: 6.7 g/dL (ref 6.0–8.3)

## 2019-11-03 LAB — C-REACTIVE PROTEIN: CRP: <1 mg/dL (ref 0.5–20.0)

## 2019-11-03 LAB — URIC ACID: Uric Acid, Serum: 5.7 mg/dL (ref 2.4–7.0)

## 2019-11-03 LAB — SEDIMENTATION RATE: Sed Rate: 7 mm/hr (ref 0–30)

## 2019-11-03 NOTE — Patient Instructions (Signed)
I want you to take 1000 mg of acetaminophen every 12 hours if you have another episode of pain  I am referring you to orthopedics for further evaluation    De Quervain's Tenosynovitis  De Quervain's tenosynovitis is a condition that causes inflammation of the tendon on the thumb side of the wrist. Tendons are cords of tissue that connect bones to muscles. The tendons in the hand pass through a tunnel called a sheath. A slippery layer of tissue (synovium) lets the tendons move smoothly in the sheath. With de Quervain's tenosynovitis, the sheath swells or thickens, causing friction and pain. The condition is also called de Quervain's disease and de Quervain's syndrome. It occurs most often in women who are 84-16 years old. What are the causes? The exact cause of this condition is not known. It may be associated with overuse of the hand and wrist. What increases the risk? You are more likely to develop this condition if you:  Use your hands far more than normal, especially if you repeat certain movements that involve twisting your hand or using a tight grip.  Are pregnant.  Are a middle-aged woman.  Have rheumatoid arthritis.  Have diabetes. What are the signs or symptoms? The main symptom of this condition is pain on the thumb side of the wrist. The pain may get worse when you grasp something or turn your wrist. Other symptoms may include:  Pain that extends up the forearm.  Swelling of your wrist and hand.  Trouble moving the thumb and wrist.  A sensation of snapping in the wrist.  A bump filled with fluid (cyst) in the area of the pain. How is this diagnosed? This condition may be diagnosed based on:  Your symptoms and medical history.  A physical exam. During the exam, your health care provider may do a simple test Wynn Maudlin test) that involves pulling your thumb and wrist to see if this causes pain. You may also need to have an X-ray. How is this treated? Treatment for  this condition may include:  Avoiding any activity that causes pain and swelling.  Taking medicines. Anti-inflammatory medicines and corticosteroid injections may be used to reduce inflammation and relieve pain.  Wearing a splint.  Having surgery. This may be needed if other treatments do not work. Once the pain and swelling has gone down:  Physical therapy. This includes stretching and strengthening exercises.  Occupational therapy. This includes adjusting how you move your wrist. Follow these instructions at home: If you have a splint:  Wear the splint as told by your health care provider. Remove it only as told by your health care provider.  Loosen the splint if your fingers tingle, become numb, or turn cold and blue.  Keep the splint clean.  If the splint is not waterproof: ? Do not let it get wet. ? Cover it with a watertight covering when you take a bath or a shower. Managing pain, stiffness, and swelling   Avoid movements and activities that cause pain and swelling in the wrist area.  If directed, put ice on the painful area. This may be helpful after doing activities that involve the sore wrist. ? Put ice in a plastic bag. ? Place a towel between your skin and the bag. ? Leave the ice on for 20 minutes, 2-3 times a day.  Move your fingers often to avoid stiffness and to lessen swelling.  Raise (elevate) the injured area above the level of your heart while you are sitting  or lying down. General instructions  Return to your normal activities as told by your health care provider. Ask your health care provider what activities are safe for you.  Take over-the-counter and prescription medicines only as told by your health care provider.  Keep all follow-up visits as told by your health care provider. This is important. Contact a health care provider if:  Your pain medicine does not help.  Your pain gets worse.  You develop new symptoms. Summary  De Quervain's  tenosynovitis is a condition that causes inflammation of the tendon on the thumb side of the wrist.  The condition occurs most often in women who are 84-88 years old.  The exact cause of this condition is not known. It may be associated with overuse of the hand and wrist.  Treatment starts with avoiding activity that causes pain or swelling in the wrist area. Other treatment may include wearing a splint and taking medicine. Sometimes, surgery is needed. This information is not intended to replace advice given to you by your health care provider. Make sure you discuss any questions you have with your health care provider. Document Revised: 02/04/2018 Document Reviewed: 07/13/2017 Elsevier Patient Education  2020 [employer].

## 2019-11-03 NOTE — Progress Notes (Signed)
Subjective:  Patient ID: Brandi [name], female    DOB: Dec 17, 1926  Age: 86 y.o. MRN: [id number]  CC: The primary encounter diagnosis was Pain in joint of right hand. Diagnoses of Dupuytren's contracture of right hand and Longstanding persistent atrial fibrillation ([place]) were also pertinent to this visit.  HPI [name] presents for recurrent episodes   of RIGHT HAND PAIN   This visit occurred during the SARS-CoV-2 public health emergency.  Safety protocols were in place, including screening questions prior to the visit, additional usage of staff PPE, and extensive cleaning of exam room while observing appropriate contact time as indicated for disinfecting solutions.   86 yr old female with no history of traumatic  right elbow fracture several years ago managed with casting  Presents  With recent history of an episode of excruciating pain and swelling that involved the dorsum of her right hand.   Occurred one month ago and lasted 8 hours .,  Started in the evening.   No previous unusual physical acitivities,  Does not crochet or knit,  Had not  Fallen or been exposed to cold temperatures. The hand  did not turn white or blue.  No color change to hand.    2ND EPISODE occurred about 3 WEEKS AGO  And was SO SEVERE " SHE considered going to the ER .  It also  lasted 8 hours before starting to improve. She reports that applying heating pad made it worse , but applying ice helped .  Hand was weak for 2 days,  By day 3 all symptoms had completely resolved.   Outpatient Medications Prior to Visit  Medication Sig Dispense Refill  . apixaban (ELIQUIS) 2.5 MG TABS tablet Take 1 tablet (2.5 mg total) by mouth 2 (two) times daily. 180 tablet 2  . ezetimibe (ZETIA) 10 MG tablet TAKE 1 TABLET BY MOUTH  DAILY 90 tablet 3  . furosemide (LASIX) 20 MG tablet TAKE 1 TABLET BY MOUTH  DAILY AS NEEDED FOR FLUID  RETENTION 90 tablet 2  . metoprolol succinate (TOPROL-XL) 50 MG 24 hr tablet Take 1 tablet (50 mg total)  by mouth daily. Take with or immediately following a meal. 90 tablet 0  . Multiple Vitamin (MULTIVITAMIN) tablet Take 1 tablet by mouth daily.      . potassium chloride (KLOR-CON) 10 MEQ tablet Take 1 tablet (10 mEq total) by mouth daily. 90 tablet 0   No facility-administered medications prior to visit.    Review of Systems;  Patient denies headache, fevers, malaise, unintentional weight loss, skin rash, eye pain, sinus congestion and sinus pain, sore throat, dysphagia,  hemoptysis , cough, dyspnea, wheezing, chest pain, palpitations, orthopnea, edema, abdominal pain, nausea, melena, diarrhea, constipation, flank pain, dysuria, hematuria, urinary  Frequency, nocturia, numbness, tingling, seizures,  Focal weakness, Loss of consciousness,  Tremor, insomnia, depression, anxiety, and suicidal ideation.      Objective:  BP 130/74 (BP Location: Left Arm, Patient Position: Sitting, Cuff Size: Normal)   Pulse (!) 46   Temp (!) 96.5 F (35.8 C) (Temporal)   Resp 15   Ht 5\' 1"  (1.549 m)   Wt 106 lb 3.2 oz (48.2 kg)   SpO2 99%   BMI 20.07 kg/m   BP Readings from Last 3 Encounters:  11/03/19 130/74  10/03/19 140/76  08/31/19 140/74    Wt Readings from Last 3 Encounters:  11/03/19 106 lb 3.2 oz (48.2 kg)  10/03/19 109 lb (49.4 kg)  09/15/19 108 lb (49  kg)    General appearance: alert, cooperative and appears stated age Ears: normal TM's and external ear canals both ears Throat: lips, mucosa, and tongue normal; teeth and gums normal Neck: no adenopathy, no carotid bruit, supple, symmetrical, trachea midline and thyroid not enlarged, symmetric, no tenderness/mass/nodules Back: symmetric, no curvature. ROM normal. No CVA tenderness. Lungs: clear to auscultation bilaterally Heart: regular rate and rhythm, S1, S2 normal, no murmur, click, rub or gallop Abdomen: soft, non-tender; bowel sounds normal; no masses,  no organomegaly Pulses: 2+ and symmetric in brachioradialis and radial .    Ext:  Right hand with mild Dupytren's contracture of middle finger noted on palm.  Movement is normal. No synovitis  Skin: Skin color, texture, turgor normal. No rashes or lesions Lymph nodes: Cervical, supraclavicular, and axillary nodes normal. Neuro: CNs 2-12 intact. DTRs 2+/4 in biceps, brachioradialis, patellars and achilles. Muscle strength 5/5 in upper and lower exremities. Fine resting tremor bilaterally both hands cerebellar function normal. Romberg negative.  No pronator drift.   Gait normal.    Assessment & Plan:   Problem List Items Addressed This Visit      Unprioritized   Pain in joint of right hand - Primary   Relevant Orders   DG Hand Complete Right (Completed)   Sedimentation rate (Completed)   Uric acid (Completed)   C-reactive protein (Completed)   ANA (Completed)   Comprehensive metabolic panel (Completed)   Dupuytren's contracture of right hand    No signs of embolic phenomena or arterial compromise and she is taking Eliquis. Unclear if the contracture is the cause of her episodic pain , a consequence or a concurrent symptoms . Plain films today note no fractures or erosive changes but note diffuse soft tissue swelling and advanced degenerative changes of the first MCP .  Serologies suggest an autoimmune process   But no current inflammation,  Rheumatology referral advised   Lab Results  Component Value Date   ANA POSITIVE (A) 11/03/2019   Lab Results  Component Value Date   ESRSEDRATE 7 11/03/2019   Lab Results  Component Value Date   LABURIC 5.7 11/03/2019   Lab Results  Component Value Date   CRP <1.0 11/03/2019        Atrial fibrillation (HCC)    Confirmed with ZIO monitor.  Rate controlled.  Embolic stroke risk managed with Eliquis          I am having Phylicia Mcgaugh. [name] maintain her multivitamin, furosemide, ezetimibe, metoprolol succinate, potassium chloride, and apixaban.  No orders of the defined types were placed in this  encounter.   There are no discontinued medications.  Follow-up: No follow-ups on file.   [name], MD

## 2019-11-05 DIAGNOSIS — M25541 Pain in joints of right hand: Secondary | ICD-10-CM

## 2019-11-05 LAB — ANTI-NUCLEAR AB-TITER (ANA TITER): ANA Titer 1: 1:80 {titer} — ABNORMAL HIGH

## 2019-11-05 LAB — ANA: Anti Nuclear Antibody (ANA): POSITIVE — AB

## 2019-11-06 DIAGNOSIS — I4891 Unspecified atrial fibrillation: Secondary | ICD-10-CM | POA: Insufficient documentation

## 2019-11-06 DIAGNOSIS — M25541 Pain in joints of right hand: Secondary | ICD-10-CM | POA: Insufficient documentation

## 2019-11-06 DIAGNOSIS — M720 Palmar fascial fibromatosis [Dupuytren]: Secondary | ICD-10-CM | POA: Insufficient documentation

## 2019-11-06 NOTE — Assessment & Plan Note (Signed)
Confirmed with ZIO monitor.  Rate controlled.  Embolic stroke risk managed with Eliquis  

## 2019-11-06 NOTE — Assessment & Plan Note (Addendum)
No signs of embolic phenomena or arterial compromise and she is taking Eliquis. Unclear if the contracture is the cause of her episodic pain , a consequence or a concurrent symptoms . Plain films today note no fractures or erosive changes but note diffuse soft tissue swelling and advanced degenerative changes of the first MCP .  Serologies suggest an autoimmune process   But no current inflammation,  Rheumatology referral advised   Lab Results  Component Value Date   ANA POSITIVE (A) 11/03/2019   Lab Results  Component Value Date   ESRSEDRATE 7 11/03/2019   Lab Results  Component Value Date   LABURIC 5.7 11/03/2019   Lab Results  Component Value Date   CRP <1.0 11/03/2019

## 2019-11-08 ENCOUNTER — Ambulatory Visit: Payer: Medicare Other | Admitting: Internal Medicine

## 2019-11-08 ENCOUNTER — Telehealth: Payer: Self-pay | Admitting: Internal Medicine

## 2019-11-08 NOTE — Telephone Encounter (Signed)
Pt calling back to get lab results  

## 2019-11-10 NOTE — Telephone Encounter (Signed)
See result note message 

## 2019-11-10 NOTE — Assessment & Plan Note (Signed)
Patient hs declined rheumatology referral for positive ANA

## 2019-12-20 ENCOUNTER — Other Ambulatory Visit: Payer: Self-pay | Admitting: Cardiovascular Disease

## 2020-01-01 NOTE — Progress Notes (Signed)
Cardiology Office Note  Date:  01/02/2020   ID:  GAL SMOLINSKI, DOB 1927-06-18, MRN [id number]  PCP:  [name], MD   Chief Complaint  Patient presents with  . Other    Pt has no complaints. Meds verbally reviewed w/ pt.     HPI:  86 year old woman with  anterior leaflet MVP and moderate to severe MR, in 2018 Mild to moderate  aortic valve stenosis Moderate to severe TR, RVSP 54 in 2018 Hyperlipidemia Lives at [facility] who presents for routine followup of her mitral and aortic valve disease, atrial fibrillation  Lives at [facility] well, EKG in NSR today Recent event monitor was 100% atrial fib Only sx was tachy palpitations at night in bed  Does not feel that she is having sx from valve, mitral or tricuspid  On last clinic visit March she was back in normal sinus rhythm Tolerating Eliquis low-dose with metoprolol, no complaints She reports family wondering why she is on anticoagulation  Event monitor, February 2021  discussed in detail Rhythm is atrial fibrillation/flutter, 100% burden ranging from 52-199 bpm (avg of 96 bpm). 4 Ventricular Tachycardia (unable to exclude atrial fibrillation with aberrancy) runs occurred, the run with the fastest interval lasting 9 beats with a max rate of 193 bpm, the longest lasting 10 beats with an avg rate of 163 bpm.  No patient triggered Isolated VEs were occasional (1.8%, 32223), VE Couplets were rare (<1.0%, 58), and VE Triplets were rare (<1.0%, 2). Ventricular Bigeminy and Trigeminy were present. Patient triggered events were not associated with significant arrhythmia  EKG personally reviewed by myself on todays visit NSR with rate 70 bpm, nonspecific T wave ABN III and AVF  Echo 07/2018 Left ventricle: The cavity size was normal. Wall thickness was   increased in a pattern of mild LVH. Systolic function was   vigorous. The estimated ejection fraction was in the range of 65%   to 70%.  - Aortic valve:  mild  stenosis. - Mitral valve:  Mild prolapse. mild stenosis.  moderate to   severe regurgitation directed posteriorly. - Left atrium: The atrium was severely dilated. - Pulmonary arteries: Systolic pressure was moderately increased,   in the range of 40 mm Hg to 45 mm Hg plus central venous/right   atrial pressure.   PMH:   has a past medical history of Hyperlipidemia, Mitral regurgitation, and Mitral valve prolapse.  PSH:    Past Surgical History:  Procedure Laterality Date  . BIOPSY BREAST  1980   left ,  benign  . BREAST BIOPSY  1980   normal  . Hospitalized for endocarditis  1987    Current Outpatient Medications  Medication Sig Dispense Refill  . apixaban (ELIQUIS) 2.5 MG TABS tablet Take 1 tablet (2.5 mg total) by mouth 2 (two) times daily. 180 tablet 2  . ezetimibe (ZETIA) 10 MG tablet TAKE 1 TABLET BY MOUTH  DAILY 90 tablet 3  . furosemide (LASIX) 20 MG tablet TAKE 1 TABLET BY MOUTH  DAILY AS NEEDED FOR FLUID  RETENTION 90 tablet 2  . metoprolol succinate (TOPROL-XL) 50 MG 24 hr tablet Take 1 tablet (50 mg total) by mouth daily. Take with or immediately following a meal. 90 tablet 0  . Multiple Vitamin (MULTIVITAMIN) tablet Take 1 tablet by mouth daily.      . potassium chloride (KLOR-CON) 10 MEQ tablet TAKE 1 TABLET BY MOUTH  DAILY 90 tablet 0   No current facility-administered medications  for this visit.     Allergies:   Codeine, Statins, and Sulfonamide derivatives   Social History:  The patient  reports that she has never smoked. She has never used smokeless tobacco. She reports that she does not drink alcohol or use drugs.   Family History:   family history includes Cancer in her sister; Coronary artery disease in her father; Heart attack in her father; Heart disease in her brother.    Review of Systems: Review of Systems  Constitutional: Negative.   HENT: Negative.   Respiratory: Negative.   Cardiovascular: Negative.   Gastrointestinal: Negative.    Musculoskeletal: Negative.   Neurological: Negative.   Psychiatric/Behavioral: Negative.   All other systems reviewed and are negative.   PHYSICAL EXAM: VS:  BP 124/70 (BP Location: Left Arm, Patient Position: Sitting, Cuff Size: Normal)   Pulse 70   Ht 4\' 11"  (1.499 m)   Wt 104 lb 8 oz (47.4 kg)   SpO2 97%   BMI 21.11 kg/m  , BMI Body mass index is 21.11 kg/m. Constitutional:  oriented to person, place, and time. No distress.  HENT:  Head: Grossly normal Eyes:  no discharge. No scleral icterus.  Neck: No JVD, no carotid bruits  Cardiovascular: Regular rate and rhythm, 9-5/6 systolic ejection murmur left sternal border Pulmonary/Chest: Clear to auscultation bilaterally, no wheezes or rails Abdominal: Soft.  no distension.  no tenderness.  Musculoskeletal: Normal range of motion Neurological:  normal muscle tone. Coordination normal. No atrophy Skin: Skin warm and dry Psychiatric: normal affect, pleasant   Recent Labs: 05/11/2019: TSH 1.56 11/03/2019: ALT 27; BUN 21; Creatinine, Ser 1.03; Potassium 4.5; Sodium 138    Lipid Panel Lab Results  Component Value Date   CHOL 173 05/11/2019   HDL 59.70 05/11/2019   LDLCALC 101 (H) 05/11/2019   TRIG 61.0 05/11/2019      Wt Readings from Last 3 Encounters:  01/02/20 104 lb 8 oz (47.4 kg)  11/03/19 106 lb 3.2 oz (48.2 kg)  10/03/19 109 lb (49.4 kg)     ASSESSMENT AND PLAN:  Atrial flutter Maintaining normal sinus rhythm on Eliquis, metoprolol succinate 50 daily, Arrhythmia jan and feb 2021 Discussed need for medication  Mixed hyperlipidemia Tolerating Zetia stable  Chronic venous insufficiency  recommend compression hose No edema today  MITRAL REGURGITATION No SOB Mitral valve prolapse, known Moderate to severe MR, she is aware, Does not want surgery, discussed mitral clip, she prefers to hold off at this time  Aortic valve stenosis Mild aortic valve stenosis on recent echocardiogram stable  Pulmonary  HTN Denies SOB Lasix as needed for swelling  Disposition:   F/U  6 month   Total encounter time more than 25 minutes  Greater than 50% was spent in counseling and coordination of care with the patient    No orders of the defined types were placed in this encounter.    Signed, Esmond Plants, M.D., Ph.D. 01/02/2020  [place], Angelina

## 2020-01-02 ENCOUNTER — Other Ambulatory Visit: Payer: Self-pay

## 2020-01-02 ENCOUNTER — Ambulatory Visit (INDEPENDENT_AMBULATORY_CARE_PROVIDER_SITE_OTHER): Payer: Medicare Other | Admitting: Cardiovascular Disease

## 2020-01-02 ENCOUNTER — Encounter: Payer: Self-pay | Admitting: Cardiovascular Disease

## 2020-01-02 VITALS — BP 124/70 | HR 70 | Ht 59.0 in | Wt 104.5 lb

## 2020-01-02 DIAGNOSIS — I080 Rheumatic disorders of both mitral and aortic valves: Secondary | ICD-10-CM | POA: Diagnosis not present

## 2020-01-02 DIAGNOSIS — I350 Nonrheumatic aortic (valve) stenosis: Secondary | ICD-10-CM

## 2020-01-02 DIAGNOSIS — I4811 Longstanding persistent atrial fibrillation: Secondary | ICD-10-CM

## 2020-01-02 DIAGNOSIS — I872 Venous insufficiency (chronic) (peripheral): Secondary | ICD-10-CM | POA: Diagnosis not present

## 2020-01-02 DIAGNOSIS — I4892 Unspecified atrial flutter: Secondary | ICD-10-CM

## 2020-01-02 DIAGNOSIS — I272 Pulmonary hypertension, unspecified: Secondary | ICD-10-CM

## 2020-01-02 MED ORDER — POTASSIUM CHLORIDE CRYS ER 10 MEQ PO TBCR: 10.0000 meq | EXTENDED_RELEASE_TABLET | Freq: Every day | ORAL | 1 refills | Status: DC | PRN

## 2020-01-02 NOTE — Patient Instructions (Addendum)
Medication Instructions:  No changes  Potassium as needed  If you need a refill on your cardiac medications before your next appointment, please call your pharmacy.    Lab work: No new labs needed   If you have labs (blood work) drawn today and your tests are completely normal, you will receive your results only by: [name] MyChart Message (if you have MyChart) OR . A paper copy in the mail If you have any lab test that is abnormal or we need to change your treatment, we will call you to review the results.   Testing/Procedures: No new testing needed   Follow-Up: At [facility], you and your health needs are our priority.  As part of our continuing mission to provide you with exceptional heart care, we have created designated Provider Care Teams.  These Care Teams include your primary Cardiologist (physician) and Advanced Practice Providers (APPs -  Physician Assistants and Nurse Practitioners) who all work together to provide you with the care you need, when you need it.  . You will need a follow up appointment in 6 months   . Providers on your designated Care Team:   . Nicolasa Ducking, NP . Eula Listen, PA-C . Marisue Ivan, PA-C  Any Other Special Instructions Will Be Listed Below (If Applicable).  For educational health videos Log in to : [email or web address] Or : [email or web address], password : [email or web address]

## 2020-01-03 ENCOUNTER — Other Ambulatory Visit: Payer: Self-pay | Admitting: Cardiovascular Disease

## 2020-02-06 ENCOUNTER — Other Ambulatory Visit: Payer: Self-pay

## 2020-02-06 ENCOUNTER — Other Ambulatory Visit (INDEPENDENT_AMBULATORY_CARE_PROVIDER_SITE_OTHER): Payer: Medicare Other

## 2020-02-06 DIAGNOSIS — I10 Essential (primary) hypertension: Secondary | ICD-10-CM

## 2020-02-06 DIAGNOSIS — R5383 Other fatigue: Secondary | ICD-10-CM

## 2020-02-06 LAB — CBC WITH DIFFERENTIAL/PLATELET
Basophils Absolute: 0 10*3/uL (ref 0.0–0.1)
Basophils Relative: 0.6 % (ref 0.0–3.0)
Eosinophils Absolute: 0.1 10*3/uL (ref 0.0–0.7)
Eosinophils Relative: 2.3 % (ref 0.0–5.0)
HCT: 42.6 % (ref 36.0–46.0)
Hemoglobin: 14.2 g/dL (ref 12.0–15.0)
Lymphocytes Relative: 23.3 % (ref 12.0–46.0)
Lymphs Abs: 1.4 10*3/uL (ref 0.7–4.0)
MCHC: 33.2 g/dL (ref 30.0–36.0)
MCV: 98.3 fl (ref 78.0–100.0)
Monocytes Absolute: 0.6 10*3/uL (ref 0.1–1.0)
Monocytes Relative: 9.4 % (ref 3.0–12.0)
Neutro Abs: 3.9 10*3/uL (ref 1.4–7.7)
Neutrophils Relative %: 64.4 % (ref 43.0–77.0)
Platelets: 213 10*3/uL (ref 150.0–400.0)
RBC: 4.33 Mil/uL (ref 3.87–5.11)
RDW: 14.1 % (ref 11.5–15.5)
WBC: 6.1 10*3/uL (ref 4.0–10.5)

## 2020-02-06 LAB — COMPREHENSIVE METABOLIC PANEL
ALT: 15 U/L (ref 0–35)
AST: 31 U/L (ref 0–37)
Albumin: 3.9 g/dL (ref 3.5–5.2)
Alkaline Phosphatase: 64 U/L (ref 39–117)
BUN: 23 mg/dL (ref 6–23)
CO2: 30 mEq/L (ref 19–32)
Calcium: 9.6 mg/dL (ref 8.4–10.5)
Chloride: 102 mEq/L (ref 96–112)
Creatinine, Ser: 0.94 mg/dL (ref 0.40–1.20)
GFR: 55.88 mL/min — ABNORMAL LOW (ref >60.00)
Glucose, Bld: 96 mg/dL (ref 70–99)
Potassium: 4.6 mEq/L (ref 3.5–5.1)
Sodium: 138 mEq/L (ref 135–145)
Total Bilirubin: 0.8 mg/dL (ref 0.2–1.2)
Total Protein: 6.4 g/dL (ref 6.0–8.3)

## 2020-02-06 LAB — TSH: TSH: 1.51 u[IU]/mL (ref 0.35–4.50)

## 2020-02-08 ENCOUNTER — Ambulatory Visit (INDEPENDENT_AMBULATORY_CARE_PROVIDER_SITE_OTHER): Payer: Medicare Other | Admitting: Internal Medicine

## 2020-02-08 ENCOUNTER — Encounter: Payer: Self-pay | Admitting: Internal Medicine

## 2020-02-08 ENCOUNTER — Other Ambulatory Visit: Payer: Self-pay

## 2020-02-08 DIAGNOSIS — I4811 Longstanding persistent atrial fibrillation: Secondary | ICD-10-CM

## 2020-02-08 DIAGNOSIS — M25541 Pain in joints of right hand: Secondary | ICD-10-CM

## 2020-02-08 DIAGNOSIS — D6869 Other thrombophilia: Secondary | ICD-10-CM

## 2020-02-08 DIAGNOSIS — I10 Essential (primary) hypertension: Secondary | ICD-10-CM | POA: Diagnosis not present

## 2020-02-08 NOTE — Patient Instructions (Addendum)
If you have another episode of severe hand pain , try  this combination   Advil  600 mg every 8 hours  AND  Tylenol  1000 mg every 8 hours   Take both of these for 3 to 5 days  Only .  Then reduce the tylenol to 1000 mg every 12 hours and the advil to 400 mg  Every 8 hours if still needed   I will remind Brandi [name] to set up your prolia injection for next month

## 2020-02-08 NOTE — Progress Notes (Signed)
Subjective:  Patient ID: Brandi [name], female    DOB: 27-May-1927  Age: 86 y.o. MRN: [id number]  CC: Diagnoses of Pain in joint of right hand, Longstanding persistent atrial fibrillation ([place]), Acquired thrombophilia ([place]), and Essential hypertension were pertinent to this visit.  HPI [name] presents for follow up on multiple issues ,  Including recent episodes of hand pain   This visit occurred during the SARS-CoV-2 public health emergency.  Safety protocols were in place, including screening questions prior to the visit, additional usage of staff PPE, and extensive cleaning of exam room while observing appropriate contact time as indicated for disinfecting solutions.   Previous episode of hand pain reviewed, along with the screening labs which were notable for a normal ESR and a positive ANA.  She declined rheumatology referral.  She states that she has not had any subsequent episodes of pain  Other than than the 2 episodes that occurred spontaneously one month apart , lasted several days,  And were so severe they frightened her into considering an ER visit. There was no discoloration of the fingers or fingertips, no tingling or decreased ROM, just diffuse severe pain without swelling or redness.   Reviewed activities:  She knits and crochets daily .  Reviewed use of NSAIDS and tylenol,  She had only tried motrin in subtherapeutic doses.   Acquired thrombophilia: she is taking Eliquis for mitigation of stroke risk. She has had no bleeding episodes and no falls.   Osteoporosis; she is due for her semi annual Prolia injection next month.  Patient is taking her medications as prescribed and notes no adverse effects.  Home BP readings have been done about once per week and are  generally < 130/80 .  She is avoiding added salt in her diet and walking regularly about 3 times per week for exercise  .  Outpatient Medications Prior to Visit  Medication Sig Dispense Refill  . apixaban (ELIQUIS)  2.5 MG TABS tablet Take 1 tablet (2.5 mg total) by mouth 2 (two) times daily. 180 tablet 2  . ezetimibe (ZETIA) 10 MG tablet TAKE 1 TABLET BY MOUTH  DAILY 90 tablet 3  . furosemide (LASIX) 20 MG tablet TAKE 1 TABLET BY MOUTH  DAILY AS NEEDED FOR FLUID  RETENTION 90 tablet 2  . metoprolol succinate (TOPROL-XL) 50 MG 24 hr tablet TAKE 1 TABLET BY MOUTH  DAILY WITH OR IMMEDIATELY  FOLLOWING A MEAL 90 tablet 1  . Multiple Vitamin (MULTIVITAMIN) tablet Take 1 tablet by mouth daily.      . potassium chloride (KLOR-CON) 10 MEQ tablet Take 1 tablet (10 mEq total) by mouth daily as needed. 30 tablet 1   No facility-administered medications prior to visit.    Review of Systems;  Patient denies headache, fevers, malaise, unintentional weight loss, skin rash, eye pain, sinus congestion and sinus pain, sore throat, dysphagia,  hemoptysis , cough, dyspnea, wheezing, chest pain, palpitations, orthopnea, edema, abdominal pain, nausea, melena, diarrhea, constipation, flank pain, dysuria, hematuria, urinary  Frequency, nocturia, numbness, tingling, seizures,  Focal weakness, Loss of consciousness,  Tremor, insomnia, depression, anxiety, and suicidal ideation.      Objective:  BP (!) 120/52 (BP Location: Left Arm, Patient Position: Sitting, Cuff Size: Normal)   Pulse 72   Temp (!) 97.5 F (36.4 C) (Temporal)   Resp 15   Ht _0  (1.499 m)   Wt 105 lb 3.2 oz (47.7 kg)   SpO2 99%   BMI 21.25 kg/m  BP Readings from Last 3 Encounters:  02/08/20 (!) 120/52  01/02/20 124/70  11/03/19 130/74    Wt Readings from Last 3 Encounters:  02/08/20 105 lb 3.2 oz (47.7 kg)  01/02/20 104 lb 8 oz (47.4 kg)  11/03/19 106 lb 3.2 oz (48.2 kg)    General appearance: alert, cooperative and appears stated age Ears: normal TM's and external ear canals both ears Throat: lips, mucosa, and tongue normal; teeth and gums normal Neck: no adenopathy, no carotid bruit, supple, symmetrical, trachea midline and thyroid not  enlarged, symmetric, no tenderness/mass/nodules Back: symmetric, no curvature. ROM normal. No CVA tenderness. Lungs: clear to auscultation bilaterally Heart: regular rate and rhythm, S1, S2 normal, no murmur, click, rub or gallop Abdomen: soft, non-tender; bowel sounds normal; no masses,  no organomegaly Pulses: 2+ and symmetric Skin: Skin color, texture, turgor normal. No rashes or lesions Lymph nodes: Cervical, supraclavicular, and axillary nodes normal.  Lab Results  Component Value Date   HGBA1C 5.5 05/22/2017    Lab Results  Component Value Date   CREATININE 0.94 02/06/2020   CREATININE 1.03 11/03/2019   CREATININE 0.92 05/11/2019    Lab Results  Component Value Date   WBC 6.1 02/06/2020   HGB 14.2 02/06/2020   HCT 42.6 02/06/2020   PLT 213.0 02/06/2020   GLUCOSE 96 02/06/2020   CHOL 173 05/11/2019   TRIG 61.0 05/11/2019   HDL 59.70 05/11/2019   LDLDIRECT 91.0 10/31/2016   LDLCALC 101 (H) 05/11/2019   ALT 15 02/06/2020   AST 31 02/06/2020   NA 138 02/06/2020   K 4.6 02/06/2020   CL 102 02/06/2020   CREATININE 0.94 02/06/2020   BUN 23 02/06/2020   CO2 30 02/06/2020   TSH 1.51 02/06/2020   HGBA1C 5.5 05/22/2017    DG Bone Density  Result Date: 06/08/2018 EXAM: DUAL X-RAY ABSORPTIOMETRY (DXA) FOR BONE MINERAL DENSITY IMPRESSION: jbh Your patient Lunden Stieber completed a BMD test on 06/08/2018 using the [place] (analysis version: 14.10) manufactured by [employer]. The following summarizes the results of our evaluation. PATIENT BIOGRAPHICAL: Name: Kollins, Fenter Patient ID: [id number] Birth Date: [birth year] Height: 59.0 in. Gender: Female Exam Date: 06/08/2018 Weight: 107.4 lbs. Indications: Advanced Age, History of Fracture (Adult), Postmenopausal Fractures: Right elbow Treatments: Calcitonin, Calcium ASSESSMENT: The BMD measured at AP Spine L1-L2 is 0.625 g/cm2 with a T-score of -4.5. This patient is considered osteoporotic according to [facility]) criteria. L-3 & 4 were excluded due to degenerative changes. The quality of the scan is good. Site Region Measured Measured WHO Young Adult BMD Date       Age      Classification T-score AP Spine L1-L2 06/08/2018 88.8 Osteoporosis -4.5 0.625 g/cm2 AP Spine L1-L2 08/26/2011 82.0 Osteoporosis -4.3 0.656 g/cm2 AP Spine L1-L2 08/26/2011 82.0 Osteoporosis -4.1 0.674 g/cm2 DualFemur Neck Left 06/08/2018 88.8 Osteoporosis -3.3 0.578 g/cm2 DualFemur Neck Left 08/26/2011 82.0 Osteoporosis -2.7 0.659 g/cm2 DualFemur Neck Left 08/26/2011 82.0 Osteoporosis -2.8 0.651 g/cm2 World Health Organization [facility]) criteria for post-menopausal, Caucasian Women: Normal:       T-score at or above -1 SD Osteopenia:   T-score between -1 and -2.5 SD Osteoporosis: T-score at or below -2.5 SD RECOMMENDATIONS: 1. All patients should optimize calcium and vitamin D intake. 2. Consider FDA-approved medical therapies in postmenopausal women and men aged 45 years and older, based on the following: a. A hip or vertebral(clinical or morphometric) fracture b. T-score < -2.5 at the femoral neck or  spine after appropriate evaluation to exclude secondary causes c. Low bone mass (T-score between -1.0 and -2.5 at the femoral neck or spine) and a 10-year probability of a hip fracture > 3% or a 10-year probability of a major osteoporosis-related fracture > 20% based on the [place]-adapted WHO algorithm d. Clinician judgment and/or patient preferences may indicate treatment for people with 10-year fracture probabilities above or below these levels FOLLOW-UP: People with diagnosed cases of osteoporosis or at high risk for fracture should have regular bone mineral density tests. For patients eligible for Medicare, routine testing is allowed once every 2 years. The testing frequency can be increased to one year for patients who have rapidly progressing disease, those who are receiving or discontinuing medical therapy to restore bone mass, or have  additional risk factors. I have reviewed this report, and agree with the above findings. [facility] Electronically Signed   By: Earle Gell M.D.   On: 06/08/2018 10:08    Assessment & Plan:   Problem List Items Addressed This Visit      Unprioritized   Pain in joint of right hand    Patient hs declined rheumatology referral for positive ANA .  Recommend use of motrin 400 to 600 mg every 8 hours and tylenol 1000 mg every 8 hours for recurrent acute episodes       Essential hypertension    Well controlled on current regimen. Renal function stable, no changes today.  Lab Results  Component Value Date   CREATININE 0.94 02/06/2020   Lab Results  Component Value Date   NA 138 02/06/2020   K 4.6 02/06/2020   CL 102 02/06/2020   CO2 30 02/06/2020         Atrial fibrillation ([place])    Confirmed with ZIO monitor.  Rate controlled.  Embolic stroke risk managed with Eliquis       Acquired thrombophilia ([place])    She is tolerating Eliquis for reduction in embolic stroke risk due to atrial fibrillation          I provided  30 minutes of  face-to-face time during this encounter reviewing patient's current problems and past surgeries, labs and imaging studies, providing counseling on the above mentioned problems , and coordination  of care . I am having Ronny Ruddell. Dasaro maintain her multivitamin, furosemide, ezetimibe, apixaban, potassium chloride, and metoprolol succinate.  No orders of the defined types were placed in this encounter.   There are no discontinued medications.  Follow-up: Return in about 6 months (around 08/09/2020).   Crecencio Mc, MD

## 2020-02-10 DIAGNOSIS — D6869 Other thrombophilia: Secondary | ICD-10-CM | POA: Insufficient documentation

## 2020-02-10 NOTE — Assessment & Plan Note (Signed)
Well controlled on current regimen. Renal function stable, no changes today.  Lab Results  Component Value Date   CREATININE 0.94 02/06/2020   Lab Results  Component Value Date   NA 138 02/06/2020   K 4.6 02/06/2020   CL 102 02/06/2020   CO2 30 02/06/2020

## 2020-02-10 NOTE — Assessment & Plan Note (Signed)
Patient hs declined rheumatology referral for positive ANA .  Recommend use of motrin 400 to 600 mg every 8 hours and tylenol 1000 mg every 8 hours for recurrent acute episodes

## 2020-02-10 NOTE — Assessment & Plan Note (Signed)
She is tolerating Eliquis for reduction in embolic stroke risk due to atrial fibrillation .  

## 2020-02-10 NOTE — Assessment & Plan Note (Signed)
Confirmed with ZIO monitor.  Rate controlled.  Embolic stroke risk managed with Eliquis  

## 2020-02-16 ENCOUNTER — Emergency Department: Payer: Medicare Other

## 2020-02-16 ENCOUNTER — Other Ambulatory Visit: Payer: Self-pay

## 2020-02-16 ENCOUNTER — Inpatient Hospital Stay
Admission: EM | Admit: 2020-02-16 | Discharge: 2020-02-18 | DRG: 071 | Disposition: A | Discharge status: Nursing Home (Includes ICF) | Payer: Medicare Other | Source: Skilled Nursing Facility | Attending: Internal Medicine | Admitting: Internal Medicine

## 2020-02-16 DIAGNOSIS — R6510 Systemic inflammatory response syndrome (SIRS) of non-infectious origin without acute organ dysfunction: Secondary | ICD-10-CM | POA: Diagnosis present

## 2020-02-16 DIAGNOSIS — M720 Palmar fascial fibromatosis [Dupuytren]: Secondary | ICD-10-CM | POA: Diagnosis present

## 2020-02-16 DIAGNOSIS — Z7901 Long term (current) use of anticoagulants: Secondary | ICD-10-CM | POA: Diagnosis not present

## 2020-02-16 DIAGNOSIS — R410 Disorientation, unspecified: Secondary | ICD-10-CM | POA: Diagnosis not present

## 2020-02-16 DIAGNOSIS — I480 Paroxysmal atrial fibrillation: Secondary | ICD-10-CM | POA: Diagnosis present

## 2020-02-16 DIAGNOSIS — D6869 Other thrombophilia: Secondary | ICD-10-CM | POA: Diagnosis not present

## 2020-02-16 DIAGNOSIS — I4891 Unspecified atrial fibrillation: Secondary | ICD-10-CM | POA: Diagnosis not present

## 2020-02-16 DIAGNOSIS — I471 Supraventricular tachycardia: Secondary | ICD-10-CM | POA: Diagnosis not present

## 2020-02-16 DIAGNOSIS — Z885 Allergy status to narcotic agent status: Secondary | ICD-10-CM

## 2020-02-16 DIAGNOSIS — A419 Sepsis, unspecified organism: Secondary | ICD-10-CM | POA: Diagnosis not present

## 2020-02-16 DIAGNOSIS — R778 Other specified abnormalities of plasma proteins: Secondary | ICD-10-CM | POA: Diagnosis not present

## 2020-02-16 DIAGNOSIS — I272 Pulmonary hypertension, unspecified: Secondary | ICD-10-CM | POA: Diagnosis present

## 2020-02-16 DIAGNOSIS — Z66 Do not resuscitate: Secondary | ICD-10-CM | POA: Diagnosis present

## 2020-02-16 DIAGNOSIS — Z888 Allergy status to other drugs, medicaments and biological substances status: Secondary | ICD-10-CM

## 2020-02-16 DIAGNOSIS — D6859 Other primary thrombophilia: Secondary | ICD-10-CM | POA: Diagnosis present

## 2020-02-16 DIAGNOSIS — E782 Mixed hyperlipidemia: Secondary | ICD-10-CM | POA: Diagnosis not present

## 2020-02-16 DIAGNOSIS — G9341 Metabolic encephalopathy: Secondary | ICD-10-CM | POA: Diagnosis present

## 2020-02-16 DIAGNOSIS — E785 Hyperlipidemia, unspecified: Secondary | ICD-10-CM | POA: Diagnosis present

## 2020-02-16 DIAGNOSIS — Z20822 Contact with and (suspected) exposure to covid-19: Secondary | ICD-10-CM | POA: Diagnosis present

## 2020-02-16 DIAGNOSIS — Z882 Allergy status to sulfonamides status: Secondary | ICD-10-CM

## 2020-02-16 DIAGNOSIS — M810 Age-related osteoporosis without current pathological fracture: Secondary | ICD-10-CM | POA: Diagnosis present

## 2020-02-16 DIAGNOSIS — I340 Nonrheumatic mitral (valve) insufficiency: Secondary | ICD-10-CM | POA: Diagnosis not present

## 2020-02-16 DIAGNOSIS — R4182 Altered mental status, unspecified: Secondary | ICD-10-CM | POA: Diagnosis not present

## 2020-02-16 DIAGNOSIS — I2721 Secondary pulmonary arterial hypertension: Secondary | ICD-10-CM | POA: Diagnosis present

## 2020-02-16 DIAGNOSIS — Z8249 Family history of ischemic heart disease and other diseases of the circulatory system: Secondary | ICD-10-CM

## 2020-02-16 DIAGNOSIS — I361 Nonrheumatic tricuspid (valve) insufficiency: Secondary | ICD-10-CM | POA: Diagnosis not present

## 2020-02-16 DIAGNOSIS — I872 Venous insufficiency (chronic) (peripheral): Secondary | ICD-10-CM | POA: Diagnosis present

## 2020-02-16 DIAGNOSIS — R5381 Other malaise: Secondary | ICD-10-CM | POA: Diagnosis not present

## 2020-02-16 DIAGNOSIS — Z79899 Other long term (current) drug therapy: Secondary | ICD-10-CM | POA: Diagnosis not present

## 2020-02-16 DIAGNOSIS — I10 Essential (primary) hypertension: Secondary | ICD-10-CM | POA: Diagnosis not present

## 2020-02-16 DIAGNOSIS — R7989 Other specified abnormal findings of blood chemistry: Secondary | ICD-10-CM | POA: Diagnosis not present

## 2020-02-16 DIAGNOSIS — I080 Rheumatic disorders of both mitral and aortic valves: Secondary | ICD-10-CM | POA: Diagnosis present

## 2020-02-16 DIAGNOSIS — I248 Other forms of acute ischemic heart disease: Secondary | ICD-10-CM | POA: Diagnosis present

## 2020-02-16 DIAGNOSIS — H9190 Unspecified hearing loss, unspecified ear: Secondary | ICD-10-CM | POA: Diagnosis present

## 2020-02-16 DIAGNOSIS — I350 Nonrheumatic aortic (valve) stenosis: Secondary | ICD-10-CM | POA: Diagnosis not present

## 2020-02-16 HISTORY — DX: Secondary pulmonary arterial hypertension: I27.21

## 2020-02-16 HISTORY — DX: Paroxysmal atrial fibrillation: I48.0

## 2020-02-16 HISTORY — DX: Essential (primary) hypertension: I10

## 2020-02-16 LAB — COMPREHENSIVE METABOLIC PANEL
ALT: 14 U/L (ref 0–44)
AST: 27 U/L (ref 15–41)
Albumin: 3.3 g/dL — ABNORMAL LOW (ref 3.5–5.0)
Alkaline Phosphatase: 61 U/L (ref 38–126)
Anion gap: 8 (ref 5–15)
BUN: 16 mg/dL (ref 8–23)
CO2: 27 mmol/L (ref 22–32)
Calcium: 8.7 mg/dL — ABNORMAL LOW (ref 8.9–10.3)
Chloride: 99 mmol/L (ref 98–111)
Creatinine, Ser: 0.99 mg/dL (ref 0.44–1.00)
GFR calc Af Amer: 58 mL/min — ABNORMAL LOW (ref >60)
GFR calc non Af Amer: 50 mL/min — ABNORMAL LOW (ref >60)
Glucose, Bld: 126 mg/dL — ABNORMAL HIGH (ref 70–99)
Potassium: 4.1 mmol/L (ref 3.5–5.1)
Sodium: 134 mmol/L — ABNORMAL LOW (ref 135–145)
Total Bilirubin: 1 mg/dL (ref 0.3–1.2)
Total Protein: 6.9 g/dL (ref 6.5–8.1)

## 2020-02-16 LAB — CBC WITH DIFFERENTIAL/PLATELET
Abs Immature Granulocytes: 0.11 10*3/uL — ABNORMAL HIGH (ref 0.00–0.07)
Basophils Absolute: 0.1 10*3/uL (ref 0.0–0.1)
Basophils Relative: 0 %
Eosinophils Absolute: 0 10*3/uL (ref 0.0–0.5)
Eosinophils Relative: 0 %
HCT: 38.3 % (ref 36.0–46.0)
Hemoglobin: 12.8 g/dL (ref 12.0–15.0)
Immature Granulocytes: 1 %
Lymphocytes Relative: 4 %
Lymphs Abs: 0.6 10*3/uL — ABNORMAL LOW (ref 0.7–4.0)
MCH: 32.3 pg (ref 26.0–34.0)
MCHC: 33.4 g/dL (ref 30.0–36.0)
MCV: 96.7 fL (ref 80.0–100.0)
Monocytes Absolute: 0.9 10*3/uL (ref 0.1–1.0)
Monocytes Relative: 6 %
Neutro Abs: 12.8 10*3/uL — ABNORMAL HIGH (ref 1.7–7.7)
Neutrophils Relative %: 89 %
Platelets: 275 10*3/uL (ref 150–400)
RBC: 3.96 MIL/uL (ref 3.87–5.11)
RDW: 13.2 % (ref 11.5–15.5)
WBC: 14.4 10*3/uL — ABNORMAL HIGH (ref 4.0–10.5)
nRBC: 0 % (ref 0.0–0.2)

## 2020-02-16 LAB — LACTIC ACID, PLASMA
Lactic Acid, Venous: 1.4 mmol/L (ref 0.5–1.9)
Lactic Acid, Venous: 1 mmol/L (ref 0.5–1.9)

## 2020-02-16 LAB — INFLUENZA PANEL BY PCR (TYPE A & B)
Influenza A By PCR: NEGATIVE
Influenza B By PCR: NEGATIVE

## 2020-02-16 LAB — URINALYSIS, COMPLETE (UACMP) WITH MICROSCOPIC
Bacteria, UA: NONE SEEN
Bilirubin Urine: NEGATIVE
Glucose, UA: NEGATIVE mg/dL
Hgb urine dipstick: NEGATIVE
Nitrite: NEGATIVE
Protein, ur: 30 mg/dL — AB
Specific Gravity, Urine: 1.02 (ref 1.005–1.030)
pH: 7 (ref 5.0–8.0)

## 2020-02-16 LAB — TROPONIN I (HIGH SENSITIVITY)
Troponin I (High Sensitivity): 30 ng/L — ABNORMAL HIGH (ref <18)
Troponin I (High Sensitivity): 62 ng/L — ABNORMAL HIGH (ref <18)
Troponin I (High Sensitivity): 108 ng/L (ref <18)
Troponin I (High Sensitivity): 69 ng/L — ABNORMAL HIGH (ref <18)

## 2020-02-16 LAB — PROCALCITONIN: Procalcitonin: <0.1 ng/mL

## 2020-02-16 LAB — GROUP A STREP BY PCR: Group A Strep by PCR: NOT DETECTED

## 2020-02-16 LAB — SARS CORONAVIRUS 2 BY RT PCR (HOSPITAL ORDER, PERFORMED IN ~~LOC~~ HOSPITAL LAB): SARS Coronavirus 2: NEGATIVE

## 2020-02-16 LAB — BRAIN NATRIURETIC PEPTIDE: B Natriuretic Peptide: 988.6 pg/mL — ABNORMAL HIGH (ref 0.0–100.0)

## 2020-02-16 MED ORDER — ASPIRIN EC 81 MG PO TBEC
81.0000 mg | DELAYED_RELEASE_TABLET | Freq: Every day | ORAL | Status: DC
  Administered 2020-02-16 – 2020-02-18 (×3): 81 mg via ORAL
  Filled 2020-02-16 (×3): qty 1

## 2020-02-16 MED ORDER — VANCOMYCIN HCL IN DEXTROSE 1-5 GM/200ML-% IV SOLN
1000.0000 mg | INTRAVENOUS | Status: DC
  Filled 2020-02-16: qty 200

## 2020-02-16 MED ORDER — ACETAMINOPHEN 325 MG PO TABS: 650.0000 mg | ORAL_TABLET | Freq: Four times a day (QID) | ORAL | Status: DC | PRN

## 2020-02-16 MED ORDER — METRONIDAZOLE IN NACL 5-0.79 MG/ML-% IV SOLN
500.0000 mg | Freq: Once | INTRAVENOUS | Status: AC
  Administered 2020-02-16: 500 mg via INTRAVENOUS
  Filled 2020-02-16: qty 100

## 2020-02-16 MED ORDER — SODIUM CHLORIDE 0.9 % IV SOLN
2.0000 g | Freq: Once | INTRAVENOUS | Status: AC
  Administered 2020-02-16: 2 g via INTRAVENOUS
  Filled 2020-02-16: qty 2

## 2020-02-16 MED ORDER — SODIUM CHLORIDE 0.9 % IV BOLUS
1000.0000 mL | Freq: Once | INTRAVENOUS | Status: AC
  Administered 2020-02-16: 1000 mL via INTRAVENOUS

## 2020-02-16 MED ORDER — ADULT MULTIVITAMIN W/MINERALS CH
1.0000 | ORAL_TABLET | Freq: Every day | ORAL | Status: DC
  Administered 2020-02-16 – 2020-02-18 (×3): 1 via ORAL
  Filled 2020-02-16 (×3): qty 1

## 2020-02-16 MED ORDER — SODIUM CHLORIDE 0.9 % IV SOLN
2.0000 g | INTRAVENOUS | Status: AC
  Administered 2020-02-17 – 2020-02-18 (×2): 2 g via INTRAVENOUS
  Filled 2020-02-16 (×2): qty 2

## 2020-02-16 MED ORDER — ACETAMINOPHEN 500 MG PO TABS
1000.0000 mg | ORAL_TABLET | Freq: Once | ORAL | Status: AC
  Administered 2020-02-16: 1000 mg via ORAL
  Filled 2020-02-16: qty 2

## 2020-02-16 MED ORDER — EZETIMIBE 10 MG PO TABS
10.0000 mg | ORAL_TABLET | Freq: Every day | ORAL | Status: DC
  Administered 2020-02-17 – 2020-02-18 (×2): 10 mg via ORAL
  Filled 2020-02-16 (×2): qty 1

## 2020-02-16 MED ORDER — VANCOMYCIN HCL IN DEXTROSE 1-5 GM/200ML-% IV SOLN
1000.0000 mg | Freq: Once | INTRAVENOUS | Status: AC
  Administered 2020-02-16: 1000 mg via INTRAVENOUS
  Filled 2020-02-16: qty 200

## 2020-02-16 MED ORDER — SODIUM CHLORIDE 0.9 % IV BOLUS
500.0000 mL | Freq: Once | INTRAVENOUS | Status: AC
  Administered 2020-02-16: 500 mL via INTRAVENOUS

## 2020-02-16 MED ORDER — METOPROLOL SUCCINATE ER 50 MG PO TB24
50.0000 mg | ORAL_TABLET | Freq: Every day | ORAL | Status: DC
  Administered 2020-02-16: 25 mg via ORAL
  Administered 2020-02-17 – 2020-02-18 (×2): 50 mg via ORAL
  Filled 2020-02-16 (×3): qty 1

## 2020-02-16 MED ORDER — ONDANSETRON HCL 4 MG/2ML IJ SOLN: 4.0000 mg | Freq: Three times a day (TID) | INTRAMUSCULAR | Status: DC | PRN

## 2020-02-16 NOTE — ED Notes (Signed)
Patient had large bowel movement.

## 2020-02-16 NOTE — ED Notes (Signed)
Report given to [facility] RN patient to come up to unit after change of shift.

## 2020-02-16 NOTE — ED Notes (Signed)
Pt c/o hunger at this time . A diet order was placed and pt was given food by RN Carollee Herter

## 2020-02-16 NOTE — ED Notes (Signed)
patient out of bed to toilet with assist. Skin feeling extremely hot. Rectal temp obtained. Temp of 101.6 noted. Md made aware. Urine specimen and cx sent to lab. Blood cxs x 2 and lactic acid also obtained and sent to lab.

## 2020-02-16 NOTE — TOC Initial Note (Signed)
Transition of Care [facility]) - Initial/Assessment Note    Patient Details  Name: Brandi [name] MRN: [id number] Date of Birth: 1927-05-01  Transition of [facility]) CM/SW Contact:    Bisbee Cellar, RN Phone Number: 02/16/2020, 11:37 AM  Clinical Narrative:                 Received call from Janci minor-CM @ [facility]-[phone] following up on patient in order to update family.         Patient Goals and CMS Choice        Expected Discharge Plan and Services                                                Prior Living Arrangements/Services                       Activities of Daily Living      Permission Sought/Granted                  Emotional Assessment              Admission diagnosis:  AMS Patient Active Problem List   Diagnosis Date Noted  . Acquired thrombophilia (HCC) 02/10/2020  . Pain in joint of right hand 11/06/2019  . Dupuytren's contracture of right hand 11/06/2019  . Atrial fibrillation (HCC) 11/06/2019  . Erythema of toe 08/09/2019  . Close exposure to COVID-19 virus 01/25/2019  . Mechanical pain of right knee 11/04/2018  . Long-term current use of high risk medication other than anticoagulant 05/24/2017  . Aortic valve stenosis 11/09/2016  . Mitral regurgitation 11/09/2016  . Pulmonary HTN (HCC) 11/09/2016  . Dry mouth 01/31/2014  . Encounter for preventive health examination 01/26/2013  . Edema 06/26/2011  . Essential hypertension 07/24/2009  . Chronic venous insufficiency 01/30/2009  . HERPES ZOSTER 10/12/2008  . Hyperlipidemia 10/12/2008  . HEARING LOSS, TINNITUS 10/12/2008  . MITRAL REGURGITATION 10/12/2008  . Osteoporosis 10/12/2008   PCP:  [name], MD Pharmacy:   [facility] [phone] Nicholes Rough, [facility] - [address] [place] [address] Phone: [phone] Fax: [phone]  [facility] SERVICE - [place], [place] - [address] [place], [address] [place],  [address] [place] [address] Phone: [phone] Fax: [phone]     Social Determinants of Health (SDOH) Interventions    Readmission Risk Interventions No flowsheet data found.

## 2020-02-16 NOTE — ED Triage Notes (Signed)
Patient from independent living at [facility]. Arrives via ems with c/o of confusion this morning noticed by her friend. BG prior to arrival 141, NIH score upon arrival o, patient aox4, denies painor sob. VSS family in route to ed.

## 2020-02-16 NOTE — Consult Note (Signed)
Pharmacy Antibiotic Note  Brandi [name] is a 86 y.o. female admitted on 02/16/2020 with sepsis.  Pharmacy has been consulted for vancomycin and cefepime dosing.  Patient received vancomycin 1g IV and cefepime 2g IV x 1 dose in ED.  Plan: Start Vancomycin 1g IV every 48 hours (CrCl 20-57ml/min) based on [place] Antimicrobial Dosing Guidelines.   Start Cefepime 2g IV every 24 hours based on current CrCl <56ml/min  Pharmacy will monitor and adjust dose as needed.   Height: 4\' 11"  (149.9 cm) Weight: 45.4 kg (100 lb) IBW/kg (Calculated) : 43.2  Temp (24hrs), Avg:99.5 F (37.5 C), Min:98.6 F (37 C), Max:101.8 F (38.8 C)  Recent Labs  Lab 02/16/20 0844 02/16/20 1126 02/16/20 1217  WBC 14.4*  --   --   CREATININE 0.99  --   --   LATICACIDVEN  --  1.4 1.0    Estimated Creatinine Clearance: 25.8 mL/min (by C-G formula based on SCr of 0.99 mg/dL).    Allergies  Allergen Reactions  . Codeine     REACTION: N \\T \ V  . Statins     REACTION: INTOLERANCE: muscle aches  . Sulfonamide Derivatives     REACTION: N \\T \ V    Antimicrobials this admission: 7/1 vancomycin >>  7/1 cefepime >>   Microbiology results: 7/1 BCx: pending 7/1 UCx: pending  Thank you for allowing pharmacy to be a part of this patient's care.  9/1, PharmD, BCPS Clinical Pharmacist 02/16/2020 6:17 PM

## 2020-02-16 NOTE — Progress Notes (Signed)
PHARMACY -  BRIEF ANTIBIOTIC NOTE   Pharmacy has received consult(s) for Vancomycin and Cefepime from an ED provider.  The patient's profile has been reviewed for ht/wt/allergies/indication/available labs.    One time order(s) placed by MD for Vancomycin 1 gm and Cefepime 2 gm  Further antibiotics/pharmacy consults should be ordered by admitting physician if indicated.                       Thank you, Walter Grima A 02/16/2020  12:45 PM

## 2020-02-16 NOTE — ED Provider Notes (Signed)
[facility] Emergency Department Provider Note  ____________________________________________   First MD Initiated Contact with Patient 02/16/20 [phone]     (approximate)  I have reviewed the triage vital signs and the nursing notes.   HISTORY  Chief Complaint Altered Mental Status    HPI Brandi [name] is a 86 y.o. female A. fib on Eliquis who comes in with confusion.  Patient states that she woke up this morning felt her normal self.  She is not sure exactly what time she woke up however.  States she normally wakes up around 5 AM.  States that she was having difficulties with confusion.  States that she got her chronic card out but was not sure why she got it out and it did not really know how to use her credit card.  States that she feels close to her baseline self now.  Denies ever having previously.  Denies any weakness, difficulty with speaking.  Just had the confusion.  She states that she typically gets up out of bed multiple times to urinate and denies any differences with this.  Currently she is alert and oriented x4.          Past Medical History:  Diagnosis Date  . Hyperlipidemia   . Mitral regurgitation   . Mitral valve prolapse     Patient Active Problem List   Diagnosis Date Noted  . Acquired thrombophilia (HCC) 02/10/2020  . Pain in joint of right hand 11/06/2019  . Dupuytren's contracture of right hand 11/06/2019  . Atrial fibrillation (HCC) 11/06/2019  . Erythema of toe 08/09/2019  . Close exposure to COVID-19 virus 01/25/2019  . Mechanical pain of right knee 11/04/2018  . Long-term current use of high risk medication other than anticoagulant 05/24/2017  . Aortic valve stenosis 11/09/2016  . Mitral regurgitation 11/09/2016  . Pulmonary HTN (HCC) 11/09/2016  . Dry mouth 01/31/2014  . Encounter for preventive health examination 01/26/2013  . Edema 06/26/2011  . Essential hypertension 07/24/2009  . Chronic venous insufficiency  01/30/2009  . HERPES ZOSTER 10/12/2008  . Hyperlipidemia 10/12/2008  . HEARING LOSS, TINNITUS 10/12/2008  . MITRAL REGURGITATION 10/12/2008  . Osteoporosis 10/12/2008    Past Surgical History:  Procedure Laterality Date  . BIOPSY BREAST  1980   left ,  benign  . BREAST BIOPSY  1980   normal  . Hospitalized for endocarditis  1987    Prior to Admission medications   Medication Sig Start Date End Date Taking? Authorizing Provider  apixaban (ELIQUIS) 2.5 MG TABS tablet Take 1 tablet (2.5 mg total) by mouth 2 (two) times daily. 09/20/19   Antonieta Iba, MD  ezetimibe (ZETIA) 10 MG tablet TAKE 1 TABLET BY MOUTH  DAILY 08/01/19   [name], MD  furosemide (LASIX) 20 MG tablet TAKE 1 TABLET BY MOUTH  DAILY AS NEEDED FOR FLUID  RETENTION 10/04/18   Antonieta Iba, MD  metoprolol succinate (TOPROL-XL) 50 MG 24 hr tablet TAKE 1 TABLET BY MOUTH  DAILY WITH OR IMMEDIATELY  FOLLOWING A MEAL 01/03/20   Antonieta Iba, MD  Multiple Vitamin (MULTIVITAMIN) tablet Take 1 tablet by mouth daily.      [provider]  potassium chloride (KLOR-CON) 10 MEQ tablet Take 1 tablet (10 mEq total) by mouth daily as needed. 01/02/20   Antonieta Iba, MD    Allergies Codeine, Statins, and Sulfonamide derivatives  Family History  Problem Relation Age of Onset  . Heart attack Father   .  Coronary artery disease Father   . Cancer Sister        esophageal  . Heart disease Brother     Social History Social History   Tobacco Use  . Smoking status: Never Smoker  . Smokeless tobacco: Never Used  Vaping Use  . Vaping Use: Never used  Substance Use Topics  . Alcohol use: No  . Drug use: No      Review of Systems Constitutional: No fever/chills Eyes: No visual changes. ENT: No sore throat. Cardiovascular: Denies chest pain. Respiratory: Denies shortness of breath. Gastrointestinal: No abdominal pain.  No nausea, no vomiting.  No diarrhea.  No constipation. Genitourinary:  Negative for dysuria. Musculoskeletal: Negative for back pain. Skin: Negative for rash. Neurological: Negative for headaches, focal weakness or numbness.  Confusion All other ROS negative ____________________________________________   PHYSICAL EXAM:  VITAL SIGNS: Blood pressure (!) 148/59, pulse 85, temperature 98.6 F (37 C), temperature source Oral, resp. rate 17, height 4\' 11"  (1.499 m), weight 45.4 kg, SpO2 97 %.   Constitutional: Alert and oriented. Well appearing and in no acute distress. Eyes: Conjunctivae are normal. EOMI. Head: Atraumatic. Nose: No congestion/rhinnorhea. Mouth/Throat: Mucous membranes are moist.   Neck: No stridor. Trachea Midline. FROM Cardiovascular: Normal rate, regular rhythm. Grossly normal heart sounds.  Good peripheral circulation. Respiratory: Normal respiratory effort.  No retractions. Lungs CTAB. Gastrointestinal: Soft and nontender. No distention. No abdominal bruits.  Musculoskeletal: NIHSS=zero.    Cranial nerves intact.  Equal strength in arms and legs with sensation intact Neurologic:  Normal speech and language. No gross focal neurologic deficits are appreciated.  Skin:  Skin is warm, dry and intact. No rash noted. Psychiatric: Mood and affect are normal. Speech and behavior are normal. GU: Deferred   ____________________________________________   LABS (all labs ordered are listed, but only abnormal results are displayed)  Labs Reviewed  CBC WITH DIFFERENTIAL/PLATELET - Abnormal; Notable for the following components:      Result Value   WBC 14.4 (*)    Neutro Abs 12.8 (*)    Lymphs Abs 0.6 (*)    Abs Immature Granulocytes 0.11 (*)    All other components within normal limits  COMPREHENSIVE METABOLIC PANEL - Abnormal; Notable for the following components:   Sodium 134 (*)    Glucose, Bld 126 (*)    Calcium 8.7 (*)    Albumin 3.3 (*)    GFR calc non Af Amer 50 (*)    GFR calc Af Amer 58 (*)    All other components within normal  limits  URINALYSIS, COMPLETE (UACMP) WITH MICROSCOPIC - Abnormal; Notable for the following components:   Ketones, ur TRACE (*)    Protein, ur 30 (*)    Leukocytes,Ua TRACE (*)    All other components within normal limits  TROPONIN I (HIGH SENSITIVITY) - Abnormal; Notable for the following components:   Troponin I (High Sensitivity) 30 (*)    All other components within normal limits  TROPONIN I (HIGH SENSITIVITY) - Abnormal; Notable for the following components:   Troponin I (High Sensitivity) 62 (*)    All other components within normal limits  CULTURE, BLOOD (ROUTINE X 2)  CULTURE, BLOOD (ROUTINE X 2)  URINE CULTURE  SARS CORONAVIRUS 2 BY RT PCR (HOSPITAL ORDER, PERFORMED IN [facility])  LACTIC ACID, PLASMA  PROCALCITONIN  LACTIC ACID, PLASMA   ____________________________________________   ED ECG REPORT I, , the attending physician, personally viewed and interpreted this ECG.  EKG  normal sinus rate of 77, no ST elevations, T wave inversion in lead III, V3, normal intervals ____________________________________________  RADIOLOGY   Official radiology report(s): CT Head Wo Contrast  Result Date: 02/16/2020 CLINICAL DATA:  Headache, acute, normal neuro exam. Additional history provided: Patient from independent living facility, confusion this morning EXAM: CT HEAD WITHOUT CONTRAST TECHNIQUE: Contiguous axial images were obtained from the base of the skull through the vertex without intravenous contrast. COMPARISON:  No pertinent prior studies available for comparison. FINDINGS: Brain: Moderate generalized cerebral atrophy. Mild ill-defined hypoattenuation within the cerebral white matter is nonspecific, but consistent with chronic small vessel ischemic disease. There is no acute intracranial hemorrhage. No demarcated cortical infarct. No extra-axial fluid collection. No evidence of intracranial mass. No midline shift. Vascular: No hyperdense vessel.   Atherosclerotic calcifications. Skull: Normal. Negative for fracture or focal lesion. Sinuses/Orbits: Visualized orbits show no acute finding. Moderate right sphenoid sinus mucosal thickening with associated chronic reactive osteitis. Additionally, there may be a small right sphenoid sinus air-fluid level. No significant mastoid effusion. IMPRESSION: No CT evidence of acute intracranial abnormality. Moderate generalized parenchymal atrophy with mild chronic small vessel ischemic disease. Right sphenoid sinusitis. Electronically Signed   By: Jackey LogeKyle  Golden DO   On: 02/16/2020 09:01    ____________________________________________   PROCEDURES  Procedure(s) performed (including Critical Care):  .Critical Care Performed by: Concha SeFunke, Pattijo Juste E, MD Authorized by: Concha SeFunke, Tomasz Steeves E, MD   Critical care provider statement:    Critical care time (minutes):  45   Critical care was necessary to treat or prevent imminent or life-threatening deterioration of the following conditions:  Sepsis   Critical care was time spent personally by me on the following activities:  Discussions with consultants, evaluation of patient's response to treatment, examination of patient, ordering and performing treatments and interventions, ordering and review of laboratory studies, ordering and review of radiographic studies, pulse oximetry, re-evaluation of patient's condition, obtaining history from patient or surrogate and review of old charts     ____________________________________________   INITIAL IMPRESSION / ASSESSMENT AND PLAN / ED COURSE  Brandi GrosJean M Cervantes was evaluated in Emergency Department on 02/16/2020 for the symptoms described in the history of present illness. She was evaluated in the context of the global COVID-19 pandemic, which necessitated consideration that the patient might be at risk for infection with the SARS-CoV-2 virus that causes COVID-19. Institutional protocols and algorithms that pertain to the evaluation  of patients at risk for COVID-19 are in a state of rapid change based on information released by regulatory bodies including the CDC and federal and state organizations. These policies and algorithms were followed during the patient's care in the ED.    Patient is a 86 year old who comes in with transient confusion.  Patient seems to be close to her baseline self alert and oriented x4 with a negative stroke scale.  However with the nurse she was answering some questions oddly such as when I asked her weight she stated that she was 11 pounds so seems that she still has a little bit of intermittent confusion.  Will get CT head to make sure evidence of intracranial hemorrhage, tumor although nonfocal exam.  Will get labs to evaluate for Electra abnormalities, AKI, UTI.  White count is elevated at 14.  Patient denies any other infectious symptoms.  Patient was stating she felt cold but recheck of temperature was normal.  Her troponin was slightly elevated at 30 but she continues to deny any chest pain.  Will get  repeat to make sure not uptrending.  CT head was negative for intracranial hemorrhage.  Patient's neighbor is at bedside who stated that she seemed normal the last few days and was until this morning that she came over stating that she was confused.  She stated that her lips look dry so she gave her some water and then called EMS.  She states that her confusion seems to be better now.  Cardiac markers markers trending up but she denies any chest pain.  We will continue to monitor.  Suspect is more likely demand.  Patient felt warm to touch the temperature was taken of 101.8 rectally.  Patient was given some Tylenol and fluid given she now meets sepsis criteria.  Will start on broad-spectrum antibiotics.  Unclear the exact source.  Chest x-ray no pneumonia, urine without evidence of UTI.  Consider meningitis given the confusion earlier today but she is no longer confused has a supple range of motion of her  neck, no abdominal pain to suggest abdominal infection.  Denies any rashes.  Considered lumbar puncture but patient is on Eliquis and due to the risk of this and the fact that her procalcitonin is negative it seems less likely to be something bacterial.  I discussed with the hospital team at this time I think we should hold off on lumbar puncture but discontinue the broad-spectrum antibiotics and they can admit her for blood culture rule out.  I rediscussed with a neighbor and she is at her baseline mental status.  Patient is not having neck stiffness I have low suspicion for meningitis at this time will admit patient for sepsis rule out.  12:59 PM on reevaluation heart rates are still elevated.  Getting the full 30 cc/kg of fluid resuscitation.  Patient looks well perfused on exam.  Lactate was normal.  Respiratory rate is normal.       ____________________________________________   FINAL CLINICAL IMPRESSION(S) / ED DIAGNOSES   Final diagnoses:  Sepsis, due to unspecified organism, unspecified whether acute organ dysfunction present (HCC)  Confusion      MEDICATIONS GIVEN DURING THIS VISIT:  Medications  ceFEPIme (MAXIPIME) 2 g in sodium chloride 0.9 % 100 mL IVPB (has no administration in time range)  metroNIDAZOLE (FLAGYL) IVPB 500 mg (has no administration in time range)  vancomycin (VANCOCIN) IVPB 1000 mg/200 mL premix (has no administration in time range)  ondansetron (ZOFRAN) injection 4 mg (has no administration in time range)  acetaminophen (TYLENOL) tablet 650 mg (has no administration in time range)  sodium chloride 0.9 % bolus 1,000 mL (1,000 mLs Intravenous New Bag/Given 02/16/20 1213)  acetaminophen (TYLENOL) tablet 1,000 mg (1,000 mg Oral Given 02/16/20 1213)     ED Discharge Orders    None       Note:  This document was prepared using Dragon voice recognition software and may include unintentional dictation errors.   Concha Se, MD 02/16/20 1259

## 2020-02-16 NOTE — ED Notes (Signed)
Strep and repeat trop drawn and sent

## 2020-02-16 NOTE — H&P (Signed)
History and Physical    Brandi [name] [name] IYM:[id number] DOB: Jan 06, 1927 DOA: 02/16/2020  Referring MD/NP/PA:   PCP: [name], MD   Patient coming from:  The patient is coming from independent living facility.  At baseline, pt is partially dependent for most of ADL.       Chief Complaint: confusion  HPI: Brandi [name] is a 86 y.o. female with medical history significant of HOH, hypertension, hyperlipidemia, atrial fibrillation on Eliquis, pulmonary hypertension, mitral valve prolapse, aortic stenosis, who presents with confusion.  Patient has AMS and is unable to provide accurate medical history, therefore, most of the history is obtained by discussing the case with ED physician, per EMS report, and with the nursing staff. I tried to call her family twice without success.  Per report, pt was noted to be confused this morning. She states that she dose not know how to use her credit card. When I saw pt in ED, she is still confused. She knows her own name, but is not oriented to place and time.  No active nausea, vomiting, diarrhea noted.  No respiratory distress, active cough noted.  Patient does not seem to have pain anywhere.  She moves all extremities.  No facial droop or slurred speech.  ED Course: pt was found to have WBC 14.4, lactic acid 1.4, procalcitonin <0.10, troponin 30 -->62, negative urinalysis, negative COVID-19 PCR, Flu pcr negative, electrolytes renal function okay, temperature 101.8, blood pressure 179/86, tachycardia, RR 18, oxygen saturation 95% on room air.  Chest x-ray negative for infiltration.  CT head is negative for acute intracranial abnormalities.  Patient is admitted to progressive bed as inpatient.  Review of Systems: Could not be reviewed accurately due to altered mental status.  Allergy:  Allergies  Allergen Reactions  . Codeine     REACTION: N \\T \ V  . Statins     REACTION: INTOLERANCE: muscle aches  . Sulfonamide Derivatives     REACTION: N \\T \ V     Past Medical History:  Diagnosis Date  . Hyperlipidemia   . Mitral regurgitation   . Mitral valve prolapse     Past Surgical History:  Procedure Laterality Date  . BIOPSY BREAST  1980   left ,  benign  . BREAST BIOPSY  1980   normal  . Hospitalized for endocarditis  1987    Social History:  reports that she has never smoked. She has never used smokeless tobacco. She reports that she does not drink alcohol and does not use drugs.  Family History:  Family History  Problem Relation Age of Onset  . Heart attack Father   . Coronary artery disease Father   . Cancer Sister        esophageal  . Heart disease Brother      Prior to Admission medications   Medication Sig Start Date End Date Taking? Authorizing Provider  apixaban (ELIQUIS) 2.5 MG TABS tablet Take 1 tablet (2.5 mg total) by mouth 2 (two) times daily. 09/20/19  Yes Gollan, Tollie Pizza, MD  ezetimibe (ZETIA) 10 MG tablet TAKE 1 TABLET BY MOUTH  DAILY Patient taking differently: Take 10 mg by mouth daily.  08/01/19  Yes [name], MD  furosemide (LASIX) 20 MG tablet TAKE 1 TABLET BY MOUTH  DAILY AS NEEDED FOR FLUID  RETENTION Patient taking differently: Take 20 mg by mouth daily.  10/04/18  Yes Gollan, Tollie Pizza, MD  metoprolol succinate (TOPROL-XL) 50 MG 24 hr tablet TAKE 1 TABLET BY  MOUTH  DAILY WITH OR IMMEDIATELY  FOLLOWING A MEAL Patient taking differently: Take 50 mg by mouth daily.  01/03/20  Yes Antonieta Iba, MD  Multiple Vitamin (MULTIVITAMIN) tablet Take 1 tablet by mouth daily.     Yes [provider]  potassium chloride (KLOR-CON) 10 MEQ tablet Take 1 tablet (10 mEq total) by mouth daily as needed. 01/02/20  Yes Antonieta Iba, MD    Physical Exam: Vitals:   02/16/20 0840 02/16/20 1025 02/16/20 1049 02/16/20 1224  BP:   (!) 179/86   Pulse:   (!) 115   Resp:   18   Temp:  98.9 F (37.2 C)  (!) 101.8 F (38.8 C)  TempSrc:  Oral  Rectal  SpO2:   95%   Weight: 45.4 kg     Height: 4'  11" (1.499 m)      General: Not in acute distress HEENT:       Eyes: PERRL, EOMI, no scleral icterus.       ENT: No discharge from the ears and nose, no pharynx injection, no tonsillar enlargement.        Neck: No JVD, no bruit, no mass felt. Heme: No neck lymph node enlargement. Cardiac: S1/S2, RRR, No murmurs, No gallops or rubs. Respiratory:  No rales, wheezing, rhonchi or rubs. GI: Soft, nondistended, nontender, no organomegaly, BS present. GU: No hematuria Ext: has trace pitting leg edema bilaterally. 1+DP/PT pulse bilaterally. Musculoskeletal: No joint deformities, No joint redness or warmth, no limitation of ROM in spin. Skin: No rashes.  Neuro: Confused, knows her own name, not orientated to place and time. Cranial nerves II-XII grossly intact, moves all extremities normally.  Neck supple, no neck rigidity Psych: Patient is not psychotic, no suicidal or hemocidal ideation.  Labs on Admission: I have personally reviewed following labs and imaging studies  CBC: Recent Labs  Lab 02/16/20 0844  WBC 14.4*  NEUTROABS 12.8*  HGB 12.8  HCT 38.3  MCV 96.7  PLT 275   Basic Metabolic Panel: Recent Labs  Lab 02/16/20 0844  NA 134*  K 4.1  CL 99  CO2 27  GLUCOSE 126*  BUN 16  CREATININE 0.99  CALCIUM 8.7*   GFR: Estimated Creatinine Clearance: 25.8 mL/min (by C-G formula based on SCr of 0.99 mg/dL). Liver Function Tests: Recent Labs  Lab 02/16/20 0844  AST 27  ALT 14  ALKPHOS 61  BILITOT 1.0  PROT 6.9  ALBUMIN 3.3*   No results for input(s): LIPASE, AMYLASE in the last 168 hours. No results for input(s): AMMONIA in the last 168 hours. Coagulation Profile: No results for input(s): INR, PROTIME in the last 168 hours. Cardiac Enzymes: No results for input(s): CKTOTAL, CKMB, CKMBINDEX, TROPONINI in the last 168 hours. BNP (last 3 results) No results for input(s): PROBNP in the last 8760 hours. HbA1C: No results for input(s): HGBA1C in the last 72  hours. CBG: No results for input(s): GLUCAP in the last 168 hours. Lipid Profile: No results for input(s): CHOL, HDL, LDLCALC, TRIG, CHOLHDL, LDLDIRECT in the last 72 hours. Thyroid Function Tests: No results for input(s): TSH, T4TOTAL, FREET4, T3FREE, THYROIDAB in the last 72 hours. Anemia Panel: No results for input(s): VITAMINB12, FOLATE, FERRITIN, TIBC, IRON, RETICCTPCT in the last 72 hours. Urine analysis:    Component Value Date/Time   COLORURINE YELLOW 02/16/2020 0845   APPEARANCEUR CLEAR 02/16/2020 0845   LABSPEC 1.020 02/16/2020 0845   PHURINE 7.0 02/16/2020 0845   GLUCOSEU NEGATIVE 02/16/2020 0845  HGBUR NEGATIVE 02/16/2020 0845   BILIRUBINUR NEGATIVE 02/16/2020 0845   KETONESUR TRACE (A) 02/16/2020 0845   PROTEINUR 30 (A) 02/16/2020 0845   NITRITE NEGATIVE 02/16/2020 0845   LEUKOCYTESUR TRACE (A) 02/16/2020 0845   Sepsis Labs: @LABRCNTIP (procalcitonin:4,lacticidven:4) ) Recent Results (from the past 240 hour(s))  SARS Coronavirus 2 by RT PCR (hospital order, [facility]) Nasopharyngeal Nasopharyngeal Swab     Status: None   Collection Time: 02/16/20 12:16 PM   Specimen: Nasopharyngeal Swab  Result Value Ref Range Status   SARS Coronavirus 2 NEGATIVE NEGATIVE Final    Comment: (NOTE) SARS-CoV-2 target nucleic acids are NOT DETECTED.  The SARS-CoV-2 RNA is generally detectable in upper and lower respiratory specimens during the acute phase of infection. The lowest concentration of SARS-CoV-2 viral copies this assay can detect is 250 copies / mL. A negative result does not preclude SARS-CoV-2 infection and should not be used as the sole basis for treatment or other patient management decisions.  A negative result may occur with improper specimen collection / handling, submission of specimen other than nasopharyngeal swab, presence of viral mutation(s) within the areas targeted by this assay, and inadequate number of viral copies (<250  copies / mL). A negative result must be combined with clinical observations, patient history, and epidemiological information.  Fact Sheet for Patients:   BoilerBrush.com.cyhttps://www.fda.gov/media/136312/download  Fact Sheet for Healthcare Providers: https://pope.com/https://www.fda.gov/media/136313/download  This test is not yet approved or  cleared by the [place] FDA and has been authorized for detection and/or diagnosis of SARS-CoV-2 by FDA under an Emergency Use Authorization (EUA).  This EUA will remain in effect (meaning this test can be used) for the duration of the COVID-19 declaration under Section 564(b)(1) of the Act, 21 U.S.C. section 360bbb-3(b)(1), unless the authorization is terminated or revoked sooner.  Performed at [facility], [address] [place], [place] [address]      Radiological Exams on Admission: CT Head Wo Contrast  Result Date: 02/16/2020 CLINICAL DATA:  Headache, acute, normal neuro exam. Additional history provided: Patient from independent living facility, confusion this morning EXAM: CT HEAD WITHOUT CONTRAST TECHNIQUE: Contiguous axial images were obtained from the base of the skull through the vertex without intravenous contrast. COMPARISON:  No pertinent prior studies available for comparison. FINDINGS: Brain: Moderate generalized cerebral atrophy. Mild ill-defined hypoattenuation within the cerebral white matter is nonspecific, but consistent with chronic small vessel ischemic disease. There is no acute intracranial hemorrhage. No demarcated cortical infarct. No extra-axial fluid collection. No evidence of intracranial mass. No midline shift. Vascular: No hyperdense vessel.  Atherosclerotic calcifications. Skull: Normal. Negative for fracture or focal lesion. Sinuses/Orbits: Visualized orbits show no acute finding. Moderate right sphenoid sinus mucosal thickening with associated chronic reactive osteitis. Additionally, there may be a small right sphenoid sinus air-fluid  level. No significant mastoid effusion. IMPRESSION: No CT evidence of acute intracranial abnormality. Moderate generalized parenchymal atrophy with mild chronic small vessel ischemic disease. Right sphenoid sinusitis. Electronically Signed   By: Jackey LogeKyle  Golden DO   On: 02/16/2020 09:01   DG Chest Portable 1 View  Result Date: 02/16/2020 CLINICAL DATA:  Fever. EXAM: PORTABLE CHEST 1 VIEW COMPARISON:  None. FINDINGS: Stable cardiomegaly. No pneumothorax or pleural effusion is noted. Mild bibasilar subsegmental atelectasis is noted. Bony thorax is unremarkable. IMPRESSION: Mild bibasilar subsegmental atelectasis. Aortic Atherosclerosis (ICD10-I70.0). Electronically Signed   By: Lupita RaiderJames  Green Jr M.D.   On: 02/16/2020 11:55     EKG: Independently reviewed.  Sinus rhythm,  QTC 409, LAE, mild T wave inversion in V3-V4, lead III/aVF  Assessment/Plan Principal Problem:   Sepsis (HCC) Active Problems:   Hyperlipidemia   Essential hypertension   Pulmonary HTN (HCC)   Atrial fibrillation (HCC)   Acquired thrombophilia (HCC)   Elevated troponin   Acute metabolic encephalopathy   HLD (hyperlipidemia)   Sepsis [facility]): Patient meets criteria for sepsis with leukocytosis, fever, tachycardia.  Currently hemodynamically stable.  Lactic acid 1.4.  Source of infection is not clear.  Urinalysis negative.  Chest x-ray negative.  Flu pcr negative. Patient has altered mental status, meningitis is a differential diagnosis, but the patient does not have meningeal sign.  Neck supple. Pt is on Eliquis, can not do LP.   -will admit to progressive bed as inpatient -Abx: Vancomycin and cefepime empirically (patient also received 1 dose of Flagyl in ED) -Follow-up blood culture, urine culture -Follow-up rapid strep test -will get Procalcitonin and trend lactic acid levels per sepsis protocol. -IVF: 1.5L of NS bolus -Hold Eliquis today.  If patient does not improve, may consider lumbar puncture    Hyperlipidemia -Zetia  Essential hypertension -continue metoprolol -Hold Lasix due to sepsis  Pulmonary HTN (HCC): 2D echo on 07/20/2018 showed EF 65-70%.  BNP 988.  Patient does not have oxygen desaturation -Hold Lasix due to sepsis  Atrial fibrillation (HCC) and acquired thrombophilia [facility]): Patient is on Eliquis and metoprolol.  Heart rate 100- 110s -Will hold Eliquis -Continue metoprolol  Elevated troponin: Trop 30 -->62 -->108.  Likely due to demand ischemia secondary to sepsis. -Trend troponin, -Add aspirin 81 mg -Continue Zetia and metoprolol -Check A1c, FLP -Message sent to Dr. Mariah Milling for cardiology for consult  Acute metabolic encephalopathy: CT head negative.  Possibly due to sepsis -Frequent neuro check  HLD (hyperlipidemia) -Zetia     DVT ppx: SCD Code Status: DNR (patient was DNR before.  CODE STATUS cannot be discussed due to altered mental status today.  Will temporarily order DNR.  This needs to be addressed again when family member is available). Family Communication: not done, no family member is at bed side. There is no contact number listed in Epic.  I tried to have called her home number [phone] several times without success. Disposition Plan:  Anticipate discharge back to previous independent living facility Consults called: sent message to Dr. Mariah Milling of card for consult Admission status: progressive unit as inpt        Status is: Inpatient  Remains inpatient appropriate because:Inpatient level of care appropriate due to severity of illness.  Patient has multiple comorbidities. Now presents with altered mental status, sepsis with unclear source for infection.  Patient also has elevated troponin.  Her presentation is highly complicated.  Given her old age, patient is at high risk of deteriorating.  Will need to be treated in hospital for at least 2 days.   Dispo: The patient is from: Independent living facility              Anticipated d/c is  to: Independent living facility              Anticipated d/c date is: 2 days              Patient currently is not medically stable to d/c.       Date of Service 02/16/2020    Lorretta Harp [facility]   If 7PM-7AM, please contact night-coverage [email or web address] 02/16/2020, 3:02 PM

## 2020-02-17 ENCOUNTER — Encounter: Payer: Self-pay | Admitting: Internal Medicine

## 2020-02-17 ENCOUNTER — Inpatient Hospital Stay (HOSPITAL_COMMUNITY)
Admit: 2020-02-17 | Discharge: 2020-02-17 | Disposition: A | Discharge status: Home / Self Care | Payer: Medicare Other | Attending: Nurse Practitioner | Admitting: Nurse Practitioner

## 2020-02-17 DIAGNOSIS — R6510 Systemic inflammatory response syndrome (SIRS) of non-infectious origin without acute organ dysfunction: Secondary | ICD-10-CM

## 2020-02-17 DIAGNOSIS — I340 Nonrheumatic mitral (valve) insufficiency: Secondary | ICD-10-CM

## 2020-02-17 DIAGNOSIS — I350 Nonrheumatic aortic (valve) stenosis: Secondary | ICD-10-CM

## 2020-02-17 DIAGNOSIS — E782 Mixed hyperlipidemia: Secondary | ICD-10-CM

## 2020-02-17 DIAGNOSIS — I361 Nonrheumatic tricuspid (valve) insufficiency: Secondary | ICD-10-CM

## 2020-02-17 DIAGNOSIS — I272 Pulmonary hypertension, unspecified: Secondary | ICD-10-CM

## 2020-02-17 LAB — LIPID PANEL
Cholesterol: 105 mg/dL (ref 0–200)
HDL: 29 mg/dL — ABNORMAL LOW (ref >40)
LDL Cholesterol: 68 mg/dL (ref 0–99)
Total CHOL/HDL Ratio: 3.6 RATIO
Triglycerides: 42 mg/dL (ref <150)
VLDL: 8 mg/dL (ref 0–40)

## 2020-02-17 LAB — CBC
HCT: 34.8 % — ABNORMAL LOW (ref 36.0–46.0)
Hemoglobin: 11.5 g/dL — ABNORMAL LOW (ref 12.0–15.0)
MCH: 31.9 pg (ref 26.0–34.0)
MCHC: 33 g/dL (ref 30.0–36.0)
MCV: 96.7 fL (ref 80.0–100.0)
Platelets: 240 10*3/uL (ref 150–400)
RBC: 3.6 MIL/uL — ABNORMAL LOW (ref 3.87–5.11)
RDW: 13.4 % (ref 11.5–15.5)
WBC: 18.4 10*3/uL — ABNORMAL HIGH (ref 4.0–10.5)
nRBC: 0 % (ref 0.0–0.2)

## 2020-02-17 LAB — BASIC METABOLIC PANEL
Anion gap: 4 — ABNORMAL LOW (ref 5–15)
BUN: 19 mg/dL (ref 8–23)
CO2: 27 mmol/L (ref 22–32)
Calcium: 8 mg/dL — ABNORMAL LOW (ref 8.9–10.3)
Chloride: 103 mmol/L (ref 98–111)
Creatinine, Ser: 0.97 mg/dL (ref 0.44–1.00)
GFR calc Af Amer: 60 mL/min — ABNORMAL LOW (ref >60)
GFR calc non Af Amer: 51 mL/min — ABNORMAL LOW (ref >60)
Glucose, Bld: 108 mg/dL — ABNORMAL HIGH (ref 70–99)
Potassium: 3.7 mmol/L (ref 3.5–5.1)
Sodium: 134 mmol/L — ABNORMAL LOW (ref 135–145)

## 2020-02-17 LAB — ECHOCARDIOGRAM COMPLETE
Height: 60 in
Weight: 1651.2 oz

## 2020-02-17 LAB — PROCALCITONIN: Procalcitonin: 6.65 ng/mL

## 2020-02-17 MED ORDER — APIXABAN 2.5 MG PO TABS
2.5000 mg | ORAL_TABLET | Freq: Two times a day (BID) | ORAL | Status: DC
  Administered 2020-02-17 – 2020-02-18 (×3): 2.5 mg via ORAL
  Filled 2020-02-17 (×3): qty 1

## 2020-02-17 NOTE — Progress Notes (Addendum)
Pt. is 86yo. widow who lives in an independent living home at [facility]; she came to ED via EMS after experiencing weakness and dizziness and contacting her neighbor Fannie Knee for advice.  Pt. amenable to coming to hospital initially, but now is eager to go home, especially since she suspects further treatment/testing may be delayed by holiday weekend.  Pt. in good spirits and conversational; shared that she has five step-children by her late husband who died in 11-29-2012: all five seem to live in the [place], but two spend the summers in [place].  One of this children, pt.'s dtr., was already on her way to visit for 4th of July and is in town visiting pt. in hospital.  Pt. is somewhat concerned that, per this hospitalization, [facility] may ask her to move from her current home to an assisted living apartment. Pt.'s neighbor Fannie Knee arrived at end of visit; shared her enjoyment of sitting on pt.'s porch at [facility].  Pt. very grateful for [facility] visit and wished [facility] well.  No further needs at this time.    02/17/20 1530  Clinical Encounter Type  Visited With Patient and family together  Visit Type Initial;Psychological support;Social support  Referral From Other (Comment) (Routine Rounding)  Spiritual Encounters  Spiritual Needs Emotional  Stress Factors  Patient Stress Factors Loss of control;Major life changes;Health changes

## 2020-02-17 NOTE — Consult Note (Signed)
Cardiology Consult    Patient ID: [name] MRN: [id number], DOB/AGE: 05/09/1927   Admit date: 02/16/2020 Date of Consult: 02/17/2020  Primary Physician: [name], MD Primary Cardiologist: Julien Nordmann, MD Requesting Provider: L. Pokhrel, MD  Patient Profile    Brandi [name] is a 86 y.o. female with a history of paroxysmal atrial fibrillation on Eliquis, hypertension, hyperlipidemia, mild mitral prolapse with moderate to severe mitral regurgitation, and pulmonary hypertension, who is being seen today for the evaluation of high-sensitivity troponin elevation in the setting of sepsis and altered mental status at the request of Dr. Tyson Babinski.  Past Medical History   Past Medical History:  Diagnosis Date  . Essential hypertension   . Hyperlipidemia   . Mitral valve prolapse   . Moderate to Severe Mitral regurgitation    a. 07/2018 Echo: EF 65-70%. Mild AS. Mild MVP w/ mild MS and mod-sev MR. Sev dil LA. Midly dil RA. PASP 40-64mmHg.  [name] PAF (paroxysmal atrial fibrillation) (HCC)    a. CHA2DS2VASc = 4-->Eliquis 2.5mg  BID; b. 08/2019 Event Monitor: 100% Afib (52-199, avg 96). 4 runs of VT vs afib w/ aberrancy.  [name] PAH (pulmonary artery hypertension) (HCC)    a. 07/2018 Echo: PASP 40-16mmHg in setting of mild MS and mod-sev MR.    Past Surgical History:  Procedure Laterality Date  . BIOPSY BREAST  1980   left ,  benign  . BREAST BIOPSY  1980   normal  . Hospitalized for endocarditis  1987     Allergies  Allergies  Allergen Reactions  . Codeine     REACTION: N \\T \ V  . Statins     REACTION: INTOLERANCE: muscle aches  . Sulfonamide Derivatives     REACTION: N \\T \ V    History of Present Illness    86 year old female with above past medical history including paroxysmal atrial fibrillation on Eliquis, hypertension, hyperlipidemia, mild mitral prolapse with moderate to severe mitral vegetation, and pulmonary hypertension.  She lives at [facility] and is generally fairly  active, able to walk 1 mile without symptoms or limitations.  She was last seen in cardiology clinic in May 2021, at which time she was doing well.  She was in her usual state of health until July 1, when she awoke and felt confused.  She says that she went to a neighbor's home because she felt she needed help and EMS was called.  In the emergency department, she was found to be in sinus rhythm.  Tmax 101.8.  Leukocytosis was noted with a white count of 14.4.  Lactic acid was elevated at 1.4.  Initial procalcitonin was normal at less than 0.10.  Urinalysis, COVID-19 PCR, and flu PCR were negative.  Electrolytes and renal function were within normal limits.  High-sensitivity troponin was elevated at 30 subsequently peaked at 108.  CXR and head CT were unremarkable.  She was admitted and placed on broad spectrum abx given concern for sepsis.  She remained hemodynamically stable overnight and this AM is feeling much better with complete resolution of confusion.  We've been asked to eval 2/2 mild HsTrop elevation.  She denies any recent episodes of chest pain or dyspnea.  ECG w/o acute ST/T changes.  On tele overnight, she did have multiple, frequent, and generally brief runs of SVT/atrial tachycardia.  She did miss her  blocker dose yesterday and received toprol 25mg  (1/2 usual dose) last night.  She is due for 50mg  this AM.  Inpatient Medications    .  aspirin EC  81 mg Oral Daily  . ezetimibe  10 mg Oral Daily  . metoprolol succinate  50 mg Oral Daily  . multivitamin with minerals  1 tablet Oral Daily    Family History    Family History  Problem Relation Age of Onset  . Heart attack Father   . Coronary artery disease Father   . Cancer Sister        esophageal  . Heart disease Brother    She indicated that her mother is deceased. She indicated that her father is deceased. She indicated that her sister is deceased. She indicated that her brother is deceased.   Social History    Social History     Socioeconomic History  . Marital status: Widowed    Spouse name: Not on file  . Number of children: 0  . Years of education: Not on file  . Highest education level: Not on file  Occupational History  . Occupation: retired  Tobacco Use  . Smoking status: Never Smoker  . Smokeless tobacco: Never Used  Vaping Use  . Vaping Use: Never used  Substance and Sexual Activity  . Alcohol use: No  . Drug use: No  . Sexual activity: Not Currently  Other Topics Concern  . Not on file  Social History Narrative   Lives at [facility]   Exercise, walks 7 days a week with aerobic exercise 2 days a week      Social Determinants of Health   Financial Resource Strain: Low Risk   . Difficulty of Paying Living Expenses: Not hard at all  Food Insecurity: No Food Insecurity  . Worried About Programme researcher, broadcasting/film/video in the Last Year: Never true  . Ran Out of Food in the Last Year: Never true  Transportation Needs: No Transportation Needs  . Lack of Transportation (Medical): No  . Lack of Transportation (Non-Medical): No  Physical Activity: Sufficiently Active  . Days of Exercise per Week: 7 days  . Minutes of Exercise per Session: 30 min  Stress: No Stress Concern Present  . Feeling of Stress : Not at all  Social Connections: Unknown  . Frequency of Communication with Friends and Family: More than three times a week  . Frequency of Social Gatherings with Friends and Family: Once a week  . Attends Religious Services: Not on file  . Active Member of Clubs or Organizations: Yes  . Attends Banker Meetings: Not on file  . Marital Status: Widowed  Intimate Partner Violence: Not At Risk  . Fear of Current or Ex-Partner: No  . Emotionally Abused: No  . Physically Abused: No  . Sexually Abused: No     Review of Systems    General:  No chills, fever, night sweats or weight changes.  Cardiovascular:  No chest pain, dyspnea on exertion, edema, orthopnea, palpitations, paroxysmal  nocturnal dyspnea. Dermatological: No rash, lesions/masses Respiratory: No cough, dyspnea Urologic: No hematuria, dysuria Abdominal:   No nausea, vomiting, diarrhea, bright red blood per rectum, melena, or hematemesis Neurologic:  Confusion on 7/1 - now resolved.  No visual changes, wkns. All other systems reviewed and are otherwise negative except as noted above.  Physical Exam    Blood pressure 112/61, pulse 72, temperature (!) 97.5 F (36.4 C), temperature source Oral, resp. rate 18, height 5' (1.524 m), weight 46.8 kg, SpO2 99 %.  General: Pleasant, NAD Psych: Normal affect. Neuro: Alert and oriented X 3. Moves all extremities spontaneously. HEENT: Normal  Neck: Supple without bruits or JVD. Lungs:  Resp regular and unlabored, CTA. Heart: RRR no s3, s4. 3/6 syst murmur @ LLSB  apex. Abdomen: Soft, non-tender, non-distended, BS + x 4.  Extremities: No clubbing, cyanosis or edema. DP/PT/Radials 2+ and equal bilaterally.  Labs    Cardiac Enzymes Recent Labs  Lab 02/16/20 0844 02/16/20 1057 02/16/20 1305 02/16/20 1839  TROPONINIHS 30* 62* 108* 69*      Lab Results  Component Value Date   WBC 18.4 (H) 02/17/2020   HGB 11.5 (L) 02/17/2020   HCT 34.8 (L) 02/17/2020   MCV 96.7 02/17/2020   PLT 240 02/17/2020    Recent Labs  Lab 02/16/20 0844 02/16/20 0844 02/17/20 0433  NA 134*   < > 134*  K 4.1   < > 3.7  CL 99   < > 103  CO2 27   < > 27  BUN 16   < > 19  CREATININE 0.99   < > 0.97  CALCIUM 8.7*   < > 8.0*  PROT 6.9  --   --   BILITOT 1.0  --   --   ALKPHOS 61  --   --   ALT 14  --   --   AST 27  --   --   GLUCOSE 126*   < > 108*   < > = values in this interval not displayed.   Lab Results  Component Value Date   CHOL 105 02/17/2020   HDL 29 (L) 02/17/2020   LDLCALC 68 02/17/2020   TRIG 42 02/17/2020   No results found for: [facility]   Radiology Studies    CT Head Wo Contrast  Result Date: 02/16/2020 CLINICAL DATA:  Headache, acute, normal neuro  exam. Additional history provided: Patient from independent living facility, confusion this morning EXAM: CT HEAD WITHOUT CONTRAST TECHNIQUE: Contiguous axial images were obtained from the base of the skull through the vertex without intravenous contrast. COMPARISON:  No pertinent prior studies available for comparison. FINDINGS: Brain: Moderate generalized cerebral atrophy. Mild ill-defined hypoattenuation within the cerebral white matter is nonspecific, but consistent with chronic small vessel ischemic disease. There is no acute intracranial hemorrhage. No demarcated cortical infarct. No extra-axial fluid collection. No evidence of intracranial mass. No midline shift. Vascular: No hyperdense vessel.  Atherosclerotic calcifications. Skull: Normal. Negative for fracture or focal lesion. Sinuses/Orbits: Visualized orbits show no acute finding. Moderate right sphenoid sinus mucosal thickening with associated chronic reactive osteitis. Additionally, there may be a small right sphenoid sinus air-fluid level. No significant mastoid effusion. IMPRESSION: No CT evidence of acute intracranial abnormality. Moderate generalized parenchymal atrophy with mild chronic small vessel ischemic disease. Right sphenoid sinusitis. Electronically Signed   By: Jackey LogeKyle  Golden DO   On: 02/16/2020 09:01   DG Chest Portable 1 View  Result Date: 02/16/2020 CLINICAL DATA:  Fever. EXAM: PORTABLE CHEST 1 VIEW COMPARISON:  None. FINDINGS: Stable cardiomegaly. No pneumothorax or pleural effusion is noted. Mild bibasilar subsegmental atelectasis is noted. Bony thorax is unremarkable. IMPRESSION: Mild bibasilar subsegmental atelectasis. Aortic Atherosclerosis (ICD10-I70.0). Electronically Signed   By: Lupita RaiderJames  Green Jr M.D.   On: 02/16/2020 11:55    ECG & Cardiac Imaging    RSR, 77, BAE - personally reviewed.  Tele - sinus rhythm w/ freq PACs and frequent, brief runs of atrial tachycardia overnight.  Assessment & Plan    1.  Sepsis:  Pt  presented 7/1 following awakening w/ altered mental status.  She was found to be  febrile w/ elevated lactic acid (1.4).  Initial procalcitonin was nl but subsequently came back @ 6.65 this AM.  WBC 14.4 on arrival, 18.4 this AM.  CXR and UA unremarkable.  Covid/Flu neg.  BC pending.  Feeling much better this AM.  Abx per IM.  2.  AMS:  In setting of #1.  Head CT w/o acute findings.  Resolved.  3.  Elevated HsTroponin:  Mild HsT elevation in the setting of above.   Peak of 108 yesterday afternoon.  No c/p or dypsnea prior to hospitalization (walked a mile 2 days ago).  BP was elevated in ED @ 179/86 w/ HR of 115 at one point.  She has had intermittent runs of atrial tachycardia, which could certainly result in demand ischemia.  Echo pending.  If nl EF, given advanced age and lack of symptoms, would not pursue ischemic eval.  Cont  blocker. Cont ASA for now but would plan to d/c once eliquis is resumed.  4.  PAF:  In sinus this admission, though frequent PACs and runs of atrial tachycardia noted.  Pt does feel palpitations.  She did not take her AM dose of metoprolol yesterday and was given 1/2 her usual dose last night.  Cont toprol xl 50mg  daily.  If she cont to have palpitations/PAT, can look to titrate further as resting HR and BP allow.  Plan to resume eliquis once it is clear that she will not require any invasive procedures (LP prev discussed).  5.  Essential HTN:  Stable this AM.  Cont  blocker.  6.  Moderate to Severe MR:  Symptomatically stable.  Euvolemic on exam.  Cont conservative rx.  She is not interested in surgical eval and we can discuss potential role of mitraclip as outpt.  Signed, , NP 02/17/2020, 11:38 AM  For questions or updates, please contact   Please consult [email or web address] for contact info under Cardiology/STEMI.

## 2020-02-17 NOTE — Plan of Care (Signed)

## 2020-02-17 NOTE — Progress Notes (Addendum)
PROGRESS NOTE  Brandi [name] DOB: [birth year] DOA: 02/16/2020 PCP: [name], [name], MD   LOS: 1 day   Brief narrative: As per HPI,  Brandi [name] is a 86 y.o. female with medical history of HOH, hypertension, hyperlipidemia, atrial fibrillation on Eliquis, pulmonary hypertension, mitral valve prolapse, aortic stenosis, who presented to the hospital with confusion.Per report, pt was noted to be confused unable to  use her credit card. In the ED she knew her own name, but was not oriented to place and time. In the ED, pt was found to have WBC 14.4, lactic acid 1.4, procalcitonin <0.10, troponin 30 -->62, negative urinalysis, negative COVID-19 PCR, Flu pcr negative, electrolytes renal function okay, She was febrile with temperature 101.8, blood pressure 179/86, tachycardia, RR 18, oxygen saturation 95% on room air.  Chest x-ray was negative for infiltration.  CT head was negative for acute intracranial abnormalities.  Patient was then admitted to hospital for further evaluation and treatment.  Assessment/Plan:  Principal Problem:   Acute metabolic encephalopathy Active Problems:   Hyperlipidemia   Essential hypertension   Pulmonary HTN (HCC)   Atrial fibrillation (HCC)   Acquired thrombophilia (HCC)   Elevated troponin   SIRS (systemic inflammatory response syndrome) (HCC)   HLD (hyperlipidemia)   Acute metabolic encephalopathy.  As per history patient was confused with irritable intact.  Patient was coherent at the time of my evaluation.  CT head scan was negative for acute findings.  Patient is nonfocal at the time of my evaluation.  SIRS criteria on presentation with fever, tachycardia and leukocytosis.  T-max of 101.8 F.  Trending up leukocytosis.  Lactate was not elevated.  Source of infection not clear yet.  Urinalysis negative, chest x-ray negative.  Flu was negative.  Will follow blood cultures/urine culture.  Procalcitonin was elevated at 6.6 subsequently.  Initial  procalcitonin was less than 0.10.  Empirically on IV vancomycin and cefepime.  We will continue for now.  Follow fever trend.  COVID-19 test was negative.  Hyperlipidemia Continue Zetia  Essential hypertension Continue metoprolol.  Lasix on hold  Pulmonary HTN  2D echo on 07/20/2018 showed EF 65-70%.  BNP 988.    Continue supportive care  Atrial fibrillation  continue  metoprolol.  Eliquis to resume.  Elevated troponin: Trop 30 -->62 -->108.  Likely due to demand ischemia.  On aspirin 81 mg, Zetia and metoprolol.  Lipid panel noted.  A1c pending.  Cardiology on board and recommend conservative treatment/echo evaluation prior to further discussion  DVT prophylaxis: Place and maintain sequential compression device Start: 02/16/20 1501   Code Status: DO NOT RESUSCITATE  Family Communication: I spoke with his care manager at the independent living facility who was able to provide me the number for the patient's daughter.  Spoke with the patient's daughter Ms. Talbert ForestShirley on the phone and updated her about the clinical condition of the patient.  Status is: Inpatient  Remains inpatient appropriate because:Inpatient level of care appropriate due to severity of illness, metabolic encephalopathy, elevated troponins,  IV antibiotic   Dispo: The patient is from: Independent living facility              Anticipated d/c is to: Independent living facility              Anticipated d/c date is: 1 to 2 days              Patient currently is not medically stable to d/c.    Consultants:  Cardiology  Procedures:  None  Antibiotics:  . Vancomycin and cefepime  Anti-infectives (From admission, onward)   Start     Dose/Rate Route Frequency Ordered Stop   02/18/20 1400  vancomycin (VANCOCIN) IVPB 1000 mg/200 mL premix     Discontinue     1,000 mg 200 mL/hr over 60 Minutes Intravenous Every 48 hours 2020-03-03 1814     02/17/20 1200  ceFEPIme (MAXIPIME) 2 g in sodium chloride 0.9 % 100 mL IVPB      Discontinue     2 g 200 mL/hr over 30 Minutes Intravenous Every 24 hours 2020-03-03 1814     03/03/20 1245  ceFEPIme (MAXIPIME) 2 g in sodium chloride 0.9 % 100 mL IVPB        2 g 200 mL/hr over 30 Minutes Intravenous  Once 2020/03/03 1239 03/03/2020 1456   Mar 03, 2020 1245  metroNIDAZOLE (FLAGYL) IVPB 500 mg        500 mg 100 mL/hr over 60 Minutes Intravenous  Once 03/03/20 1239 Mar 03, 2020 1542   03/03/2020 1245  vancomycin (VANCOCIN) IVPB 1000 mg/200 mL premix        1,000 mg 200 mL/hr over 60 Minutes Intravenous  Once 03-Mar-2020 1239 03-03-20 1816     Subjective: Today, patient was seen and examined at bedside.  Patient states that she feels little better today.  Fever of 101 yesterday.  Alert awake and communicative today denies any shortness of breath, pain, urinary urgency frequency dysuria  Objective: Vitals:   2020-03-03 2011 02/17/20 0455  BP: (!) 135/59 116/69  Pulse: 74 77  Resp: 20 20  Temp: (!) 97.5 F (36.4 C) 99.4 F (37.4 C)  SpO2: 100% 92%    Intake/Output Summary (Last 24 hours) at 02/17/2020 0716 Last data filed at 03/03/20 1816 Gross per 24 hour  Intake 1950 ml  Output --  Net 1950 ml   Filed Weights   03/03/20 0840 03-Mar-2020 2011 02/17/20 0455  Weight: 45.4 kg 46.8 kg 46.8 kg   Body mass index is 20.15 kg/m.   Physical Exam: GENERAL: Patient alert awake and communicative.  Coherent.  Oriented to person and place. HENT: No scleral pallor or icterus. Pupils equally reactive to light. Oral mucosa is moist NECK: is supple, no gross swelling noted. CHEST: Clear to auscultation. No crackles or wheezes.  Diminished breath sounds bilaterally. CVS: S1 and S2 heard, murmer systolic noted ABDOMEN: Soft, non-tender, bowel sounds are present. EXTREMITIES: Trace edema. CNS: Cranial nerves are intact. No focal motor deficits.  No neck rigidity SKIN: warm and dry without rashes.  Data Review: I have personally reviewed the following laboratory data and studies,  CBC: Recent  Labs  Lab 03-Mar-2020 0844 02/17/20 0433  WBC 14.4* 18.4*  NEUTROABS 12.8*  --   HGB 12.8 11.5*  HCT 38.3 34.8*  MCV 96.7 96.7  PLT 275 240   Basic Metabolic Panel: Recent Labs  Lab 2020/03/03 0844 02/17/20 0433  NA 134* 134*  K 4.1 3.7  CL 99 103  CO2 27 27  GLUCOSE 126* 108*  BUN 16 19  CREATININE 0.99 0.97  CALCIUM 8.7* 8.0*   Liver Function Tests: Recent Labs  Lab 2020-03-03 0844  AST 27  ALT 14  ALKPHOS 61  BILITOT 1.0  PROT 6.9  ALBUMIN 3.3*   No results for input(s): LIPASE, AMYLASE in the last 168 hours. No results for input(s): AMMONIA in the last 168 hours. Cardiac Enzymes: No results for input(s): CKTOTAL, CKMB, CKMBINDEX, TROPONINI in the last 168 hours. BNP (  last 3 results) Recent Labs    02/16/20 0844  BNP 988.6*    ProBNP (last 3 results) No results for input(s): PROBNP in the last 8760 hours.  CBG: No results for input(s): GLUCAP in the last 168 hours. Recent Results (from the past 240 hour(s))  Blood culture (routine x 2)     Status: None (Preliminary result)   Collection Time: 02/16/20 11:26 AM   Specimen: BLOOD  Result Value Ref Range Status   Specimen Description BLOOD RAC  Final   Special Requests   Final    BOTTLES DRAWN AEROBIC AND ANAEROBIC Blood Culture adequate volume   Culture   Final    NO GROWTH < 24 HOURS Performed at [facility], [address] [place], [place] [address]    Report Status PENDING  Incomplete  Blood culture (routine x 2)     Status: None (Preliminary result)   Collection Time: 02/16/20 11:26 AM   Specimen: BLOOD  Result Value Ref Range Status   Specimen Description BLOOD RIGHT HAND  Final   Special Requests   Final    BOTTLES DRAWN AEROBIC AND ANAEROBIC Blood Culture results may not be optimal due to an inadequate volume of blood received in culture bottles   Culture   Final    NO GROWTH < 24 HOURS Performed at [facility], [address], [place], [place] [address]    Report  Status PENDING  Incomplete  SARS Coronavirus 2 by RT PCR (hospital order, performed in [facility] hospital lab) Nasopharyngeal Nasopharyngeal Swab     Status: None   Collection Time: 02/16/20 12:16 PM   Specimen: Nasopharyngeal Swab  Result Value Ref Range Status   SARS Coronavirus 2 NEGATIVE NEGATIVE Final    Comment: (NOTE) SARS-CoV-2 target nucleic acids are NOT DETECTED.  The SARS-CoV-2 RNA is generally detectable in upper and lower respiratory specimens during the acute phase of infection. The lowest concentration of SARS-CoV-2 viral copies this assay can detect is 250 copies / mL. A negative result does not preclude SARS-CoV-2 infection and should not be used as the sole basis for treatment or other patient management decisions.  A negative result may occur with improper specimen collection / handling, submission of specimen other than nasopharyngeal swab, presence of viral mutation(s) within the areas targeted by this assay, and inadequate number of viral copies (<250 copies / mL). A negative result must be combined with clinical observations, patient history, and epidemiological information.  Fact Sheet for Patients:   BoilerBrush.com.cy  Fact Sheet for Healthcare Providers: https://pope.com/  This test is not yet approved or  cleared by the [place] FDA and has been authorized for detection and/or diagnosis of SARS-CoV-2 by FDA under an Emergency Use Authorization (EUA).  This EUA will remain in effect (meaning this test can be used) for the duration of the COVID-19 declaration under Section 564(b)(1) of the Act, 21 U.S.C. section 360bbb-3(b)(1), unless the authorization is terminated or revoked sooner.  Performed at [facility], [address] [place], [place] [address]   Group A Strep by PCR     Status: None   Collection Time: 02/16/20  6:38 PM   Specimen: Throat; Sterile Swab  Result Value Ref Range  Status   Group A Strep by PCR NOT DETECTED NOT DETECTED Final    Comment: Performed at [facility], [address] [place], [place] [address]     Studies: CT Head Wo Contrast  Result Date: 02/16/2020 CLINICAL DATA:  Headache, acute, normal neuro exam. Additional history provided: Patient from independent living facility, confusion this morning EXAM: CT HEAD WITHOUT CONTRAST TECHNIQUE: Contiguous axial images were obtained from the base of the skull through the vertex without intravenous contrast. COMPARISON:  No pertinent prior studies available for comparison. FINDINGS: Brain: Moderate generalized cerebral atrophy. Mild ill-defined hypoattenuation within the cerebral white matter is nonspecific, but consistent with chronic small vessel ischemic disease. There is no acute intracranial hemorrhage. No demarcated cortical infarct. No extra-axial fluid collection. No evidence of intracranial mass. No midline shift. Vascular: No hyperdense vessel.  Atherosclerotic calcifications. Skull: Normal. Negative for fracture or focal lesion. Sinuses/Orbits: Visualized orbits show no acute finding. Moderate right sphenoid sinus mucosal thickening with associated chronic reactive osteitis. Additionally, there may be a small right sphenoid sinus air-fluid level. No significant mastoid effusion. IMPRESSION: No CT evidence of acute intracranial abnormality. Moderate generalized parenchymal atrophy with mild chronic small vessel ischemic disease. Right sphenoid sinusitis. Electronically Signed   By: Jackey Loge DO   On: 02/16/2020 09:01   DG Chest Portable 1 View  Result Date: 02/16/2020 CLINICAL DATA:  Fever. EXAM: PORTABLE CHEST 1 VIEW COMPARISON:  None. FINDINGS: Stable cardiomegaly. No pneumothorax or pleural effusion is noted. Mild bibasilar subsegmental atelectasis is noted. Bony thorax is unremarkable. IMPRESSION: Mild bibasilar subsegmental atelectasis. Aortic Atherosclerosis (ICD10-I70.0). Electronically  Signed   By: Lupita Raider M.D.   On: 02/16/2020 11:55      Joycelyn Das, MD  [facility] 02/17/2020

## 2020-02-17 NOTE — Progress Notes (Signed)
*  PRELIMINARY RESULTS* Echocardiogram 2D Echocardiogram has been performed.  Joanette Gula Jevante Hollibaugh 02/17/2020, 2:35 PM

## 2020-02-17 NOTE — Consult Note (Signed)
Pharmacy Antibiotic Note  Brandi [name] is a 86 y.o. female admitted on 02/16/2020 with sepsis.  Pharmacy has been consulted for vancomycin and cefepime dosing.  Patient received vancomycin 1g IV and cefepime 2g IV x 1 dose in ED.  Plan: Continue Vancomycin 1g IV every 48 hours (CrCl 20-51ml/min) based on  Antimicrobial Dosing Guidelines.   Continue Cefepime 2g IV every 24 hours based on current CrCl <61ml/min  Pharmacy will monitor and adjust dose as needed.   Height: 5' (152.4 cm) Weight: 46.8 kg (103 lb 3.2 oz) IBW/kg (Calculated) : 45.5  Temp (24hrs), Avg:99 F (37.2 C), Min:97.5 F (36.4 C), Max:101.8 F (38.8 C)  Recent Labs  Lab 02/16/20 0844 02/16/20 1126 02/16/20 1217 02/17/20 0433  WBC 14.4*  --   --  18.4*  CREATININE 0.99  --   --  0.97  LATICACIDVEN  --  1.4 1.0  --     Estimated Creatinine Clearance: 27.7 mL/min (by C-G formula based on SCr of 0.97 mg/dL).    Allergies  Allergen Reactions  . Codeine     REACTION: N \\T \ V  . Statins     REACTION: INTOLERANCE: muscle aches  . Sulfonamide Derivatives     REACTION: N \\T \ V    Antimicrobials this admission: 7/1 vancomycin >>  7/1 cefepime >>   Microbiology results: 7/1 BCx: pending 7/1 UCx: pending  FLU/COVID NEG, Group A Strep NEG  Thank you for allowing pharmacy to be a part of this patient's care.  Albina Billet, PharmD, BCPS Clinical Pharmacist 02/17/2020 8:07 AM

## 2020-02-17 NOTE — Evaluation (Signed)
Occupational Therapy Evaluation Patient Details Name: Brandi [name] MRN: [id number] DOB: 1926/11/17 Today's Date: 02/17/2020    History of Present Illness Brandi [name] is a 86 y.o. female with medical history significant of HOH, hypertension, hyperlipidemia, atrial fibrillation on Eliquis, pulmonary hypertension, mitral valve prolapse, aortic stenosis, who presents with confusion.   Clinical Impression   [name] was seen for OT evaluation this date. Prior to hospital admission, pt was Independent in I/ADLs and mobility including walking a mile each day. Pt lives alone at [facility]. Pt presents to acute OT demonstrating impaired ADL performance, functional cognition, and functional mobility 2/2 decreased task sequencing, functional strength/balance deficits, and decreased activity tolerance. Pt currently requires SBA toothbrushing standing sinkside + VCs for sequencing. SUPERVISION for LB access in sitting. CGA + RW + VCs for ADL t/f. Pt demonstrated impulsivity t/o and required VCs for safe RW technique - unable to self-correct posterior LOB c BUE support on RW when RN applied minimal tug on gown to open and visualize rear. Pt would benefit from skilled OT to address noted impairments and functional limitations (see below for any additional details) in order to maximize safety and independence while minimizing falls risk and caregiver burden. Upon hospital discharge, recommend HHOT to maximize pt safety and return to functional independence during meaningful occupations of daily life.     Follow Up Recommendations  Home health OT;Supervision - Intermittent    Equipment Recommendations  None recommended by OT    Recommendations for Other Services       Precautions / Restrictions Precautions Precautions: Fall Precaution Comments: pt slightly impulsive Restrictions Weight Bearing Restrictions: No      Mobility Bed Mobility Overal bed mobility: Needs Assistance Bed Mobility: Supine  to Sit     Supine to sit: Supervision     General bed mobility comments: Pt received and left up in chair  Transfers Overall transfer level: Needs assistance Equipment used: Rolling walker (2 wheeled) Transfers: Sit to/from Stand Sit to Stand: Min guard         General transfer comment: CGA for balance - VCs to use RW      Balance Overall balance assessment: Needs assistance Sitting-balance support: Feet supported;No upper extremity supported Sitting balance-Leahy Scale: Good Sitting balance - Comments: no LOB noted while seated EOB    Standing balance support: Bilateral upper extremity supported Standing balance-Leahy Scale: Fair Standing balance comment: Posterior LOB c BUE support on RW when RN applied minimal tug on gown to open and visualize rear - MIN A from OT assisted to correct                           ADL either performed or assessed with clinical judgement   ADL Overall ADL's : Needs assistance/impaired                                       General ADL Comments: SBA toothbrushing standing sinkside + VCs for sequencing. SUPERVISION for LB access in sitting. CGA + RW + VCs for ADL t/f     Vision Baseline Vision/History: Wears glasses Wears Glasses: At all times       Perception     Praxis      Pertinent Vitals/Pain Pain Assessment: No/denies pain     Hand Dominance Right   Extremity/Trunk Assessment Upper Extremity Assessment Upper Extremity Assessment:  Generalized weakness   Lower Extremity Assessment Lower Extremity Assessment: Generalized weakness       Communication Communication Communication: No difficulties (Tangential speech )   Cognition Arousal/Alertness: Awake/alert Behavior During Therapy: WFL for tasks assessed/performed Overall Cognitive Status: No family/caregiver present to determine baseline cognitive functioning                                 General Comments: Pt states DOB,  year, and location correctly however unable to state month, date, or name of hospital. Pt states reason she is at hospital as "I have trouble with my eating and get light headed" then later able to state she came via [facility] 2/2 confusion.    General Comments       Exercises Exercises: Other exercises Other Exercises Other Exercises: Pt educated re: OT role, d/c recs, falls prevention, medication mgmt, ECS, safe RW technique Other Exercises: LBD, tooth brushing, hand washing, sitting/standing balance/tolerance, sit<>stand,    Shoulder Instructions      Home Living Family/patient expects to be discharged to:: Private residence Living Arrangements: Alone   Type of Home: Independent living facility Home Access: Stairs to enter Entrance Stairs-Number of Steps: 1 Entrance Stairs-Rails: Right (right side entering facility) Home Layout: One level     Bathroom Shower/Tub: Producer, television/film/video: Standard Bathroom Accessibility: Yes   Home Equipment: Environmental consultant - 4 wheels;Walker - 2 wheels   Additional Comments: Pt reports she does not use RW      Prior Functioning/Environment Level of Independence: Independent        Comments: Pt reported independent c I/ADLs including medication management however when asked what medications she takes she reported "none" then when asked to clarify pt stated she has started Eliquis and when asked if she has any trouble remembering to take it pt stated she takes it c her other medications         OT Problem List: Decreased strength;Decreased activity tolerance;Impaired balance (sitting and/or standing);Decreased cognition;Decreased safety awareness;Decreased knowledge of use of DME or AE      OT Treatment/Interventions: Self-care/ADL training;Therapeutic exercise;Energy conservation;DME and/or AE instruction;Patient/family education;Balance training;Cognitive remediation/compensation;Therapeutic activities    OT Goals(Current goals can be  found in the care plan section) Acute Rehab OT Goals Patient Stated Goal: to go home and get back to walking OT Goal Formulation: With patient Time For Goal Achievement: 03/02/20 Potential to Achieve Goals: Good ADL Goals Pt Will Perform Lower Body Dressing: with modified independence;sit to/from stand (c LRAD PRN) Additional ADL Goal #1: Pt will Independently verbalize plan to implement x3 falls prevention strategies Additional ADL Goal #2: Pt will Independently verbalize plans to implement x3 medication management strategies.  OT Frequency: Min 1X/week   Barriers to D/C: Decreased caregiver support          Co-evaluation              AM-PAC OT "6 Clicks" Daily Activity     Outcome Measure Help from another person eating meals?: None Help from another person taking care of personal grooming?: A Little Help from another person toileting, which includes using toliet, bedpan, or urinal?: A Little Help from another person bathing (including washing, rinsing, drying)?: A Little Help from another person to put on and taking off regular upper body clothing?: None Help from another person to put on and taking off regular lower body clothing?: A Little 6 Click Score: 20   End  of Session Equipment Utilized During Treatment: Rolling walker  Activity Tolerance: Patient tolerated treatment well Patient left: in chair;with call bell/phone within reach;with chair alarm set  OT Visit Diagnosis: Unsteadiness on feet (R26.81);Other abnormalities of gait and mobility (R26.89)                Time: 0092-3300 OT Time Calculation (min): 23 min Charges:  OT General Charges $OT Visit: 1 Visit OT Evaluation $OT Eval Moderate Complexity: 1 Mod OT Treatments $Self Care/Home Management : 8-22 mins   Kathie Dike, M.S. OTR/L  02/17/20, 1:11 PM

## 2020-02-17 NOTE — Evaluation (Addendum)
Physical Therapy Evaluation Patient Details Name: Brandi [name] MRN: [id number] DOB: Jan 26, 1927 Today's Date: 02/17/2020   History of Present Illness  Brandi [name] is a 86 y.o. female with medical history significant of HOH, hypertension, hyperlipidemia, atrial fibrillation on Eliquis, pulmonary hypertension, mitral valve prolapse, aortic stenosis, who presents with confusion.  Clinical Impression  Pt lying in bed upon arrival to room and excited to work with PT stating that she wasn't able to get up and move around when she was in the ER. Pt performed bed mobility with supervision with HOB elevated and performed transfers initially with SBA then unsteady on her feet once upright and required CGA. Pt ambulated 250 feet using RW with SBA however demonstrated R lateral deviations requiring CGA for safety. Pt quick and slightly impulsive with her movements indicating decreased safety. Attempted ambulation with no AD and pt staggering to R requiring min A for balance. Pt with decrease safety awareness and decreased balance with some generalized weakness. Recommend skilled PT to address aforementioned deficits and HHPT to optimize return to PLOF and maximize functional mobility and independence.     Follow Up Recommendations Home health PT    Equipment Recommendations  Rolling walker with 5" wheels;3in1 (PT)    Recommendations for Other Services       Precautions / Restrictions Precautions Precautions: Fall Precaution Comments: pt slightly impulsive Restrictions Weight Bearing Restrictions: No      Mobility  Bed Mobility Overal bed mobility: Needs Assistance Bed Mobility: Supine to Sit     Supine to sit: Supervision     General bed mobility comments: Pt received and left up in chair  Transfers Overall transfer level: Needs assistance Equipment used: Rolling walker (2 wheeled) Transfers: Sit to/from Stand Sit to Stand: Min guard         General transfer comment: CGA for  balance - VCs to use RW    Ambulation/Gait Ambulation/Gait assistance: Min guard;Supervision;Min assist Gait Distance (Feet): 350 Feet Assistive device: Rolling walker (2 wheeled);None Gait Pattern/deviations: Step-through pattern;Staggering right;Drifts right/left Gait velocity: 10' in 6"   General Gait Details: Ambulated 250 feet using RW with CGA initially then progressed to SBA. Pt with lateral drifts to R requiring CGA for safety. Attempted ambulation without AD and required min A for staggering to R several times. Pt slightly impulsive with movements requiring verbal cues for safety.  Stairs            Wheelchair Mobility    Modified Rankin (Stroke Patients Only)       Balance Overall balance assessment: Needs assistance Sitting-balance support: Feet supported;No upper extremity supported Sitting balance-Leahy Scale: Good Sitting balance - Comments: no LOB noted while seated EOB    Standing balance support: Bilateral upper extremity supported Standing balance-Leahy Scale: Fair Standing balance comment: Posterior LOB c BUE support on RW when RN applied minimal tug on gown to open and visualize rear - MIN A from OT assisted to correct                             Pertinent Vitals/Pain Pain Assessment: No/denies pain    Home Living Family/patient expects to be discharged to:: Private residence Living Arrangements: Alone   Type of Home: Independent living facility Home Access: Stairs to enter Entrance Stairs-Rails: Right (right side entering facility) Entrance Stairs-Number of Steps: 1 Home Layout: One level Home Equipment: Walker - 4 wheels Additional Comments: Pt reports she does not use RW  Prior Function Level of Independence: Independent         Comments: Pt reported independent c I/ADLs including medication management however when asked what medications she takes she reported "none" then when asked to clarify pt stated she has started  Eliquis and when asked if she has any trouble remembering to take it pt stated she takes it c her other medications      Hand Dominance   Dominant Hand: Right    Extremity/Trunk Assessment   Upper Extremity Assessment Upper Extremity Assessment: Generalized weakness    Lower Extremity Assessment Lower Extremity Assessment: Generalized weakness       Communication   Communication: No difficulties (Tangential speech )  Cognition Arousal/Alertness: Awake/alert Behavior During Therapy: WFL for tasks assessed/performed Overall Cognitive Status: No family/caregiver present to determine baseline cognitive functioning                                 General Comments: Pt states DOB, year, and location correctly however unable to state month, date, or name of hospital. Pt states reason she is at hospital as "I have trouble with my eating and get light headed" then later able to state she came via [facility] 2/2 confusion.       General Comments      Exercises Other Exercises Other Exercises: Pt educated re: OT role, d/c recs, falls prevention, medication mgmt, ECS, safe RW technique Other Exercises: LBD, tooth brushing, hand washing, sitting/standing balance/tolerance, sit<>stand,    Assessment/Plan    PT Assessment Patient needs continued PT services  PT Problem List Decreased strength;Decreased balance;Decreased knowledge of use of DME;Decreased safety awareness       PT Treatment Interventions DME instruction;Gait training;Functional mobility training;Therapeutic activities;Therapeutic exercise;Stair training;Balance training;Patient/family education    PT Goals (Current goals can be found in the Care Plan section)  Acute Rehab PT Goals Patient Stated Goal: to go home and get back to walking PT Goal Formulation: With patient Time For Goal Achievement: 03/02/20 Potential to Achieve Goals: Good Additional Goals Additional Goal #1: Pt will perform bed mobility and  transfers independently. (to return to PLOF)    Frequency Min 2X/week   Barriers to discharge        Co-evaluation               AM-PAC PT "6 Clicks" Mobility  Outcome Measure Help needed turning from your back to your side while in a flat bed without using bedrails?: None Help needed moving from lying on your back to sitting on the side of a flat bed without using bedrails?: None Help needed moving to and from a bed to a chair (including a wheelchair)?: A Little Help needed standing up from a chair using your arms (e.g., wheelchair or bedside chair)?: A Little Help needed to walk in hospital room?: A Little Help needed climbing 3-5 steps with a railing? : A Little 6 Click Score: 20    End of Session Equipment Utilized During Treatment: Gait belt Activity Tolerance: Patient tolerated treatment well Patient left: in chair;with call bell/phone within reach;with chair alarm set;with family/visitor present Nurse Communication: Mobility status PT Visit Diagnosis: Unsteadiness on feet (R26.81);Muscle weakness (generalized) (M62.81)    Time: 5277-8242 PT Time Calculation (min) (ACUTE ONLY): 29 min   Charges:   PT Evaluation $PT Eval Moderate Complexity: 1 Mod PT Treatments $Therapeutic Exercise: 8-22 mins       Frederich Chick, SPT  Edson Snowball  Cosandra Plouffe 02/17/2020, 1:30 PM

## 2020-02-18 DIAGNOSIS — I480 Paroxysmal atrial fibrillation: Secondary | ICD-10-CM

## 2020-02-18 DIAGNOSIS — I248 Other forms of acute ischemic heart disease: Secondary | ICD-10-CM

## 2020-02-18 DIAGNOSIS — A419 Sepsis, unspecified organism: Secondary | ICD-10-CM

## 2020-02-18 DIAGNOSIS — I471 Supraventricular tachycardia: Secondary | ICD-10-CM

## 2020-02-18 LAB — COMPREHENSIVE METABOLIC PANEL
ALT: 14 U/L (ref 0–44)
AST: 25 U/L (ref 15–41)
Albumin: 2.5 g/dL — ABNORMAL LOW (ref 3.5–5.0)
Alkaline Phosphatase: 58 U/L (ref 38–126)
Anion gap: 6 (ref 5–15)
BUN: 24 mg/dL — ABNORMAL HIGH (ref 8–23)
CO2: 26 mmol/L (ref 22–32)
Calcium: 8.1 mg/dL — ABNORMAL LOW (ref 8.9–10.3)
Chloride: 106 mmol/L (ref 98–111)
Creatinine, Ser: 0.88 mg/dL (ref 0.44–1.00)
GFR calc Af Amer: >60 mL/min (ref >60)
GFR calc non Af Amer: 58 mL/min — ABNORMAL LOW (ref >60)
Glucose, Bld: 111 mg/dL — ABNORMAL HIGH (ref 70–99)
Potassium: 3.9 mmol/L (ref 3.5–5.1)
Sodium: 138 mmol/L (ref 135–145)
Total Bilirubin: 0.7 mg/dL (ref 0.3–1.2)
Total Protein: 5.4 g/dL — ABNORMAL LOW (ref 6.5–8.1)

## 2020-02-18 LAB — CBC
HCT: 37.1 % (ref 36.0–46.0)
Hemoglobin: 12.1 g/dL (ref 12.0–15.0)
MCH: 32 pg (ref 26.0–34.0)
MCHC: 32.6 g/dL (ref 30.0–36.0)
MCV: 98.1 fL (ref 80.0–100.0)
Platelets: 249 10*3/uL (ref 150–400)
RBC: 3.78 MIL/uL — ABNORMAL LOW (ref 3.87–5.11)
RDW: 13.4 % (ref 11.5–15.5)
WBC: 10.1 10*3/uL (ref 4.0–10.5)
nRBC: 0 % (ref 0.0–0.2)

## 2020-02-18 LAB — URINE CULTURE: Culture: <10000 — AB

## 2020-02-18 LAB — PROCALCITONIN: Procalcitonin: 4.61 ng/mL

## 2020-02-18 LAB — MAGNESIUM: Magnesium: 2 mg/dL (ref 1.7–2.4)

## 2020-02-18 LAB — PHOSPHORUS: Phosphorus: 2.1 mg/dL — ABNORMAL LOW (ref 2.5–4.6)

## 2020-02-18 LAB — HEMOGLOBIN A1C
Hgb A1c MFr Bld: 5.1 % (ref 4.8–5.6)
Mean Plasma Glucose: 100 mg/dL

## 2020-02-18 MED ORDER — VANCOMYCIN HCL 750 MG/150ML IV SOLN
750.0000 mg | INTRAVENOUS | Status: DC
  Filled 2020-02-18: qty 150

## 2020-02-18 MED ORDER — AMOXICILLIN-POT CLAVULANATE 875-125 MG PO TABS: 1.0000 | ORAL_TABLET | Freq: Two times a day (BID) | ORAL | 0 refills | Status: AC

## 2020-02-18 MED ORDER — POTASSIUM & SODIUM PHOSPHATES 280-160-250 MG PO PACK
1.0000 | PACK | Freq: Three times a day (TID) | ORAL | Status: DC
  Administered 2020-02-18: 1 via ORAL
  Filled 2020-02-18 (×3): qty 1

## 2020-02-18 MED ORDER — SODIUM CHLORIDE 0.9 % IV SOLN
2.0000 g | Freq: Two times a day (BID) | INTRAVENOUS | Status: DC
  Administered 2020-02-18: 2 g via INTRAVENOUS
  Filled 2020-02-18 (×2): qty 2

## 2020-02-18 NOTE — Progress Notes (Signed)
Progress Note  Patient Name: Brandi [name] Date of Encounter: 02/18/2020  [facility] Cardiologist: Julien Nordmann, MD   Subjective   Feels well and is eager to be discharged.  No chest pain, shortness of breath, palpitations, or lightheadedness.  States that she is back to her baseline.  Inpatient Medications    Scheduled Meds: . apixaban  2.5 mg Oral BID  . aspirin EC  81 mg Oral Daily  . ezetimibe  10 mg Oral Daily  . metoprolol succinate  50 mg Oral Daily  . multivitamin with minerals  1 tablet Oral Daily   Continuous Infusions: . ceFEPime (MAXIPIME) IV Stopped (02/17/20 1315)  . vancomycin     PRN Meds: acetaminophen, ondansetron (ZOFRAN) IV   Vital Signs    Vitals:   02/17/20 1528 02/17/20 1930 02/18/20 0306 02/18/20 0733  BP: (!) 151/66 (!) 115/59 (!) 169/63 (!) 144/65  Pulse: (!) 59 71 73 61  Resp: 16 20 20 17   Temp: 98.1 F (36.7 C) 97.6 F (36.4 C) 98.7 F (37.1 C) 97.8 F (36.6 C)  TempSrc: Oral Oral Oral Oral  SpO2: 98% 99% 96% 94%  Weight:   47.2 kg   Height:        Intake/Output Summary (Last 24 hours) at 02/18/2020 0910 Last data filed at 02/18/2020 0305 Gross per 24 hour  Intake 340 ml  Output 800 ml  Net -460 ml   Last 3 Weights 02/18/2020 02/17/2020 02/16/2020  Weight (lbs) 104 lb 103 lb 3.2 oz 103 lb 1.6 oz  Weight (kg) 47.174 kg 46.811 kg 46.766 kg      Telemetry    Sinus rhythm with frequent PAC's and PVC's.  SVT run lasting 70 seconds yesterday afternoon. - Personally Reviewed  ECG    02/17/20: NSR with PAC's and PVC's.  Otherwise, no significant abnormality. - Personally Reviewed  Physical Exam   GEN: No acute distress.   Neck: No JVD Cardiac: RRR with frequent extrasystoles.  2/6 systolic murmur. Respiratory: Mildly diminished breath sounds throughout. GI: Soft, nontender, non-distended  MS: No edema; No deformity. Neuro:  Nonfocal  Psych: Normal affect   Labs    High Sensitivity Troponin:   Recent Labs  Lab  02/16/20 0844 02/16/20 1057 02/16/20 1305 02/16/20 1839  TROPONINIHS 30* 62* 108* 69*      Chemistry Recent Labs  Lab 02/16/20 0844 02/17/20 0433 02/18/20 0655  NA 134* 134* 138  K 4.1 3.7 3.9  CL 99 103 106  CO2 27 27 26   GLUCOSE 126* 108* 111*  BUN 16 19 24*  CREATININE 0.99 0.97 0.88  CALCIUM 8.7* 8.0* 8.1*  PROT 6.9  --  5.4*  ALBUMIN 3.3*  --  2.5*  AST 27  --  25  ALT 14  --  14  ALKPHOS 61  --  58  BILITOT 1.0  --  0.7  GFRNONAA 50* 51* 58*  GFRAA 58* 60* >60  ANIONGAP 8 4* 6     Hematology Recent Labs  Lab 02/16/20 0844 02/17/20 0433 02/18/20 0655  WBC 14.4* 18.4* 10.1  RBC 3.96 3.60* 3.78*  HGB 12.8 11.5* 12.1  HCT 38.3 34.8* 37.1  MCV 96.7 96.7 98.1  MCH 32.3 31.9 32.0  MCHC 33.4 33.0 32.6  RDW 13.2 13.4 13.4  PLT 275 240 249    BNP Recent Labs  Lab 02/16/20 0844  BNP 988.6*     DDimer No results for input(s): DDIMER in the last 168 hours.   Radiology  DG Chest Portable 1 View  Result Date: 02/16/2020 CLINICAL DATA:  Fever. EXAM: PORTABLE CHEST 1 VIEW COMPARISON:  None. FINDINGS: Stable cardiomegaly. No pneumothorax or pleural effusion is noted. Mild bibasilar subsegmental atelectasis is noted. Bony thorax is unremarkable. IMPRESSION: Mild bibasilar subsegmental atelectasis. Aortic Atherosclerosis (ICD10-I70.0). Electronically Signed   By: Lupita Raider M.D.   On: 02/16/2020 11:55   ECHOCARDIOGRAM COMPLETE  Result Date: 02/17/2020    ECHOCARDIOGRAM REPORT   Patient Name:   Brandi [name] [facility] Date of Exam: 02/17/2020 Medical Rec #:  [id number]    Height:       60.0 in Accession #:    [id number]   Weight:       103.2 lb Date of Birth:  Sep 17, 1926   BSA:          1.409 m Patient Age:    86 years     BP:           133/75 mmHg Patient Gender: F            HR:           80 bpm. Exam Location:  [facility] Procedure: 2D Echo, Color Doppler and Cardiac Doppler Indications:     R01.2 Abnormal Heart Sounds Nec  History:         Patient has prior history of  Echocardiogram examinations.                  Mitral Valve Disease; Risk Factors:Dyslipidemia.  Sonographer:     Humphrey Rolls RDCS (AE) Referring Phys:  [id number] Dois Davenport [facility] Diagnosing Phys: Julien Nordmann MD IMPRESSIONS  1. Thicken of anterioe MV leafet Vegation can not be excluded. Probable just calcium build up.  2. Left ventricular ejection fraction, by estimation, is 60 to 65%. The left ventricle has normal function. The left ventricle has no regional wall motion abnormalities. There is mild left ventricular hypertrophy. Left ventricular diastolic parameters are consistent with Grade I diastolic dysfunction (impaired relaxation).  3. Right ventricular systolic function is normal. The right ventricular size is normal.  4. Left atrial size was moderately dilated.  5. 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 No evidence of mitral stenosis.  6. The aortic valve is grossly normal. Aortic valve regurgitation is not visualized. Mild aortic valve stenosis. FINDINGS  Left Ventricle: Left ventricular ejection fraction, by estimation, is 60 to 65%. The left ventricle has normal function. The left ventricle has no regional wall motion abnormalities. The left ventricular internal cavity size was normal in size. There is  mild left ventricular hypertrophy. Left ventricular diastolic parameters are consistent with Grade I diastolic dysfunction (impaired relaxation). Right Ventricle: The right ventricular size is normal. No increase in right ventricular wall thickness. Right ventricular systolic function is normal. Left Atrium: Left atrial size was moderately dilated. Right Atrium: Right atrial size was normal in size. Pericardium: There is no evidence of pericardial effusion. Mitral Valve: The mitral valve is myxomatous. There is moderate prolapse of of the mitral valve. There is moderate thickening of the mitral valve  leaflet(s). There is moderate calcification of the mitral valve leaflet(s). Normal mobility of the mitral valve leaflets. Moderate mitral annular calcification. Moderate mitral valve regurgitation. No evidence of mitral valve stenosis. MV peak gradient, 15.7 mmHg. The mean mitral valve gradient is 4.0 mmHg. Tricuspid Valve: The tricuspid valve is normal in structure. Tricuspid valve regurgitation is mild . No evidence of  tricuspid stenosis. Aortic Valve: The aortic valve is grossly normal. Aortic valve regurgitation is not visualized. Mild aortic stenosis is present. Aortic valve mean gradient measures 11.0 mmHg. Aortic valve peak gradient measures 21.3 mmHg. Aortic valve area, by VTI measures 1.35 cm. Pulmonic Valve: The pulmonic valve was grossly normal. Pulmonic valve regurgitation is trivial. No evidence of pulmonic stenosis. Aorta: The aortic root is normal in size and structure. Venous: The inferior vena cava is normal in size with greater than 50% respiratory variability, suggesting right atrial pressure of 3 mmHg. IAS/Shunts: No atrial level shunt detected by color flow Doppler. Additional Comments: Thicken of anterioe MV leafet Vegation can not be excluded. Probable just calcium build up.  LEFT VENTRICLE PLAX 2D LVIDd:         3.63 cm  Diastology LVIDs:         2.00 cm  LV e' lateral:   3.81 cm/s LV PW:         1.40 cm  LV E/e' lateral: 44.6 LV IVS:        0.67 cm  LV e' medial:    3.59 cm/s LVOT diam:     1.90 cm  LV E/e' medial:  47.4 LV SV:         53 LV SV Index:   38 LVOT Area:     2.84 cm  LEFT ATRIUM           Index LA diam:      4.90 cm 3.48 cm/m LA Vol (A2C): 24.2 ml 17.17 ml/m LA Vol (A4C): 77.2 ml 54.79 ml/m  AORTIC VALVE                    PULMONIC VALVE AV Area (Vmax):    1.46 cm     PV Vmax:       1.38 m/s AV Area (Vmean):   1.44 cm     PV Vmean:      96.400 cm/s AV Area (VTI):     1.35 cm     PV VTI:        0.222 m AV Vmax:           231.00 cm/s  PV Peak grad:  7.6 mmHg AV Vmean:           152.500 cm/s PV Mean grad:  4.0 mmHg AV VTI:            0.392 m AV Peak Grad:      21.3 mmHg AV Mean Grad:      11.0 mmHg LVOT Vmax:         119.00 cm/s LVOT Vmean:        77.400 cm/s LVOT VTI:          0.187 m LVOT/AV VTI ratio: 0.48  AORTA Ao Root diam: 2.90 cm MITRAL VALVE MV Area (PHT): 2.60 cm     SHUNTS MV Peak grad:  15.7 mmHg    Systemic VTI:  0.19 m MV Mean grad:  4.0 mmHg     Systemic Diam: 1.90 cm MV Vmax:       1.98 m/s MV Vmean:      94.8 cm/s MV Decel Time: 292 msec MV E velocity: 170.00 cm/s Julien Nordmann MD Electronically signed by Julien Nordmann MD Signature Date/Time: 02/17/2020/4:22:18 PM    Final     Cardiac Studies   TTE (02/17/20): 1. Thicken of anterioe MV leafet Vegation can not be excluded. Probable  just calcium build up.  2.  Left ventricular ejection fraction, by estimation, is 60 to 65%. The  left ventricle has normal function. The left ventricle has no regional  wall motion abnormalities. There is mild left ventricular hypertrophy.  Left ventricular diastolic parameters  are consistent with Grade I diastolic dysfunction (impaired relaxation).  3. Right ventricular systolic function is normal. The right ventricular  size is normal.  4. Left atrial size was moderately dilated.  5. The mitral valve is myxomatous. Prolapse of the anterior leaflet  (posterior leaflet not well visualized) Moderate mitral valve  regurgitation. No evidence of mitral stenosis.  6. The aortic valve is grossly normal. Aortic valve regurgitation is not  visualized. Mild aortic valve stenosis.   Patient Profile     86 y.o. female with a history of paroxysmal atrial fibrillation on Eliquis, hypertension, hyperlipidemia, mild mitral prolapse with moderate to severe mitral regurgitation, and pulmonary hypertension, admitted with altered mental status in the setting of sepsis and found to have mildly elevated HS-TnI.  Assessment & Plan    Demand ischemia: Brandi [name] denies angina and  shortness of breath.  HS-TnI was mildly elevated, peaking on 7/1 at 108.  Echo showed normal LVEF.  I suspect elevated troponin reflects supply-demand mismatch in the setting of sepsis.  No plans for ischemia evaluation at this time.  Favor discontinuation of aspirin to lower bleeding risk, as the patient is chronically anticoagulated with apixaban.  Paroxysmal atrial fibrillation and supraventricular tachycardia: No a-fib noted, though 70-second run of PSVT occurred yesterday.  Patient denies symptoms.  Frequent supraventricular and ventricular ectopy noted on telemetry, with resting HR near 60 bpm.  Continue metoprolol succinate 50 mg daily; borderline low resting HR precludes further escalation at this time.  Continue apixaban 2.5 mg BID, which is appropriately dosed for weight and age.  Sepsis: Patient asymptomatic today and afebrile for > 24 hours.  WBC count improved (18.4 -> 10.1).  Patient remains on vancomycin and cefepime.  Blood cultures without growth to date.  Echo notable for some thickening of the aortic valve; favor calcification over vegetation.  Ongoing management per primary team.  Unless bacteremia develops or persistent fevers become a problem, I recommend against TEE at this time.  Moderate to severe MR: Patient appears euvolemic and denies HF symptoms.  MR reported as moderate on yesterday's echo.  Continue blood pressure control.  Continue outpatient follow-up.  [facility] will sign off.   Medication Recommendations:  See above Other recommendations (labs, testing, etc):  None Follow up as an outpatient:  Follow-up with Dr. Mariah MillingGollan or APP in 2-4 weeks.  For questions or updates, please contact [facility] Please consult [email or web address] for contact info under [facility].     Signed, Yvonne Kendallhristopher Kerly Rigsbee, MD  02/18/2020, 9:10 AM

## 2020-02-18 NOTE — Plan of Care (Signed)

## 2020-02-18 NOTE — Consult Note (Signed)
Pharmacy Antibiotic Note  Brandi [name] is a 86 y.o. female admitted on 02/16/2020 with sepsis.  Pharmacy has been consulted for vancomycin and cefepime dosing.   Plan: Vancomycin 1g q48H changed vancomycin 1000 mg x 1 on 7/3 followed by vancomycin 750 mg daily per [facility] nomogram. Treating sepsis with unclear source.   Adjust cefepime 2g IV every 24 hours to cefepime 2 g q12H based on renal function.   Pharmacy will monitor and adjust dose as needed.   Height: 5' (152.4 cm) Weight: 47.2 kg (104 lb) IBW/kg (Calculated) : 45.5  Temp (24hrs), Avg:98.1 F (36.7 C), Min:97.6 F (36.4 C), Max:98.7 F (37.1 C)  Recent Labs  Lab 02/16/20 0844 02/16/20 1126 02/16/20 1217 02/17/20 0433 02/18/20 0655  WBC 14.4*  --   --  18.4* 10.1  CREATININE 0.99  --   --  0.97 0.88  LATICACIDVEN  --  1.4 1.0  --   --     Estimated Creatinine Clearance: 30.5 mL/min (by C-G formula based on SCr of 0.88 mg/dL).    Allergies  Allergen Reactions  . Codeine     REACTION: N \\T \ V  . Statins     REACTION: INTOLERANCE: muscle aches  . Sulfonamide Derivatives     REACTION: N \\T \ V    Antimicrobials this admission: 7/1 vancomycin >>  7/1 cefepime >>   Microbiology results: 7/1 BCx: pending 7/1 UCx: <10,000 COLONIES/mL INSIGNIFICANT GROWTH   FLU/COVID NEG, Group A Strep NEG  Thank you for allowing pharmacy to be a part of this patient's care.  Ronnald Ramp, PharmD, BCPS Clinical Pharmacist 02/18/2020 11:40 AM

## 2020-02-18 NOTE — TOC Transition Note (Signed)
Transition of Care [facility]) - CM/SW Discharge Note   Patient Details  Name: Brandi [name] MRN: [id number] Date of Birth: 10-10-1926  [facility] of [facility]) CM/SW Contact:  Eilleen Kempf, LCSW Phone Number: 02/18/2020, 2:35 PM   Clinical Narrative:    Patient is being discharged today and we return to [facility]. Patient's daughter is here and will transport.          Patient Goals and CMS Choice        Discharge Placement                       Discharge Plan and Services                                     Social Determinants of Health (SDOH) Interventions     Readmission Risk Interventions No flowsheet data found.

## 2020-02-18 NOTE — Discharge Summary (Addendum)
Physician Discharge Summary  Brandi [name] Brandi [name]:[id number] DOB: January 19, 1927 DOA: 02/16/2020  PCP: Brandi Shams, MD  Admit date: 02/16/2020 Discharge date: 02/18/2020  Admitted From: Home  Discharge disposition: Home with home health   Recommendations for Outpatient Follow-Up:   . Follow up with your primary care provider in one week.  . Check CBC, BMP, magnesium in the next visit . Follow-up with Dr. Mariah [name] cardiology in 3 to 4 weeks  Discharge Diagnosis:   Principal Problem:   Acute metabolic encephalopathy Active Problems:   Hyperlipidemia   Essential hypertension   Pulmonary HTN (HCC)   Atrial fibrillation (HCC)   Acquired thrombophilia (HCC)   Elevated troponin   SIRS (systemic inflammatory response syndrome) (HCC)   HLD (hyperlipidemia)   Discharge Condition: Improved.  Diet recommendation: Low sodium, heart healthy.    Wound care: None.  Code status: DNR   History of Present Illness:  Brandi Ocampo Hogenis a 86 y.o.femalewith medical history ofHOH,hypertension, hyperlipidemia, atrial fibrillation on Eliquis, pulmonary hypertension, mitral valve prolapse, aortic stenosis, who presented to the hospital with confusion.Per report, pt was noted to be confused unable to  use her credit card.In the ED, she knew her own name, but was not oriented to place and time. In the ED, pt was found to have WBC 14.4, lactic acid 1.4, procalcitonin <0.10,troponin 30-->62,negative urinalysis,negativeCOVID-19 PCR,Flu pcr negative,electrolytes renal function okay, She was febrile with temperature 101.8, blood pressure 179/86, tachycardia, RR 18, oxygen saturation 95% on room air. Chest x-ray was negative for infiltration. CT head was negative for acute intracranial abnormalities. Patient was then admitted to hospital for further evaluation and treatment.   Hospital Course:   Following conditions were addressed during hospitalization as listed below,  Acute metabolic  encephalopathy.    Resolved. CT head scan was negative for acute findings.    Patient was non-focal.  SIRS criteria on presentation with fever, tachycardia and leukocytosis.  T-max of 101.8 F.    Leukocytosis has resolved.  Lactate was within normal limits.  Patient did not have further episodes fever.   Urinalysis negative, chest x-ray negative.  Flu was negative.   Procalcitonin was mildly elevated but no other source of infection.  Blood culture was negative in 2 days.  Urine culture was insignificant. Empirically received IV vancomycin and cefepime on admission.  We will continue 3 more days of Augmentin on discharge to complete 5-day course of empiric antibiotic.  COVID-19 test was negative.  Hyperlipidemia Continue Zetia  Essential hypertension Continue metoprolol.  Lasix on hold  Pulmonary HTN 2D echo on 07/20/2018 showed EF 65-70%. BNP 988.    2D echocardiogram on 02/17/2020 showed left ventricular ejection fraction of 60 to 65% with grade 1 diastolic dysfunction.  Elevated troponin: Trop 30 -->62 -->108.Likely due to demand ischemia.  On aspirin 81 mg, Zetia and metoprolol.  Lipid panel noted.    Hemoglobin A1c of 5.1. Cardiology saw the patient and recommended conservative treatment.  Paroxysmal atrial fibrillation with supraventricular tachycardia.  On metoprolol succinate.  Continue Eliquis.  Has moderate mitral regurgitation on 2D echocardiogram.  Cardiology advised follow-up in 2 to 4 weeks.  Disposition.  At this time, patient is stable for disposition home with home PT, OT.  Was advised follow-up with primary care physician in 1 week and with cardiology in 3 to 4 weeks.  I spoke with the patient's daughter Ms. Brandi [name] on the phone and updated her about the clinical condition of the patient and the plan for disposition home.  Medical Consultants:    Cardiology  Procedures:    2D echocardiogram Subjective:   Today, patient feels better no fevers, chills rigors.  No  shortness of breath chest pain palpitation.  She is at her baseline.  Discharge Exam:   Vitals:   02/18/20 0306 02/18/20 0733  BP: (!) 169/63 (!) 144/65  Pulse: 73 61  Resp: 20 17  Temp: 98.7 F (37.1 C) 97.8 F (36.6 C)  SpO2: 96% 94%   Vitals:   02/17/20 1528 02/17/20 1930 02/18/20 0306 02/18/20 0733  BP: (!) 151/66 (!) 115/59 (!) 169/63 (!) 144/65  Pulse: (!) 59 71 73 61  Resp: Temp: 98.1 F (36.7 C) 97.6 F (36.4 C) 98.7 F (37.1 C) 97.8 F (36.6 C)  TempSrc: Oral Oral Oral Oral  SpO2: 98% 99% 96% 94%  Weight:   47.2 kg   Height:       General: Alert awake, not in obvious distress, thinly built HENT: pupils equally reacting to light,  No scleral pallor or icterus noted. Oral mucosa is moist.  Chest:  Clear breath sounds.  Diminished breath sounds bilaterally. No crackles or wheezes.  CVS: S1 &S2 heard, systolic murmur noted Abdomen: Soft, nontender, nondistended.  Bowel sounds are heard.   Extremities: No cyanosis, clubbing or edema.  Peripheral pulses are palpable. Psych: Alert, awake and communicative, normal mood CNS:  No cranial nerve deficits.  Power equal in all extremities.   Skin: Warm and dry.  No rashes noted.  The results of significant diagnostics from this hospitalization (including imaging, microbiology, ancillary and laboratory) are listed below for reference.     Diagnostic Studies:   ECHOCARDIOGRAM COMPLETE  Result Date: 02/17/2020    ECHOCARDIOGRAM REPORT   Patient Name:   Brandi [name] [facility] Date of Exam: 02/17/2020 Medical Rec #:  [id number]    Height:       60.0 in Accession #:    [id number]   Weight:       103.2 lb Date of Birth:  [birth year]   BSA:          1.409 m Patient Age:    86 years     BP:           133/75 mmHg Patient Gender: F            HR:           80 bpm. Exam Location:  [facility] Procedure: 2D Echo, Color Doppler and Cardiac Doppler Indications:     R01.2 Abnormal Heart Sounds Nec  History:         Patient has prior history of  Echocardiogram examinations.                  Mitral Valve Disease; Risk Factors:Dyslipidemia.  Sonographer:     Humphrey Rolls RDCS (AE) Referring Phys:  [id number] [facility] Diagnosing Phys: Julien Nordmann MD IMPRESSIONS  1. Thicken of anterioe MV leafet Vegation can not be excluded. Probable just calcium build up.  2. Left ventricular ejection fraction, by estimation, is 60 to 65%. The left ventricle has normal function. The left ventricle has no regional wall motion abnormalities. There is mild left ventricular hypertrophy. Left ventricular diastolic parameters are consistent with Grade I diastolic dysfunction (impaired relaxation).  3. Right ventricular systolic function is normal. The right ventricular size is normal.  4. Left atrial size was moderately dilated.  5. The mitral valve is myxomatous. Prolapse of the anterior leaflet (posterior leaflet  not well visualized) Moderate mitral valve regurgitation. No evidence of mitral stenosis.  6. The aortic valve is grossly normal. Aortic valve regurgitation is not visualized. Mild aortic valve stenosis. FINDINGS  Left Ventricle: Left ventricular ejection fraction, by estimation, is 60 to 65%. The left ventricle has normal function. The left ventricle has no regional wall motion abnormalities. The left ventricular internal cavity size was normal in size. There is  mild left ventricular hypertrophy. Left ventricular diastolic parameters are consistent with Grade I diastolic dysfunction (impaired relaxation). Right Ventricle: The right ventricular size is normal. No increase in right ventricular wall thickness. Right ventricular systolic function is normal. Left Atrium: Left atrial size was moderately dilated. Right Atrium: Right atrial size was normal in size. Pericardium: There is no evidence of pericardial effusion. Mitral Valve: The mitral valve is myxomatous. There is moderate prolapse of of the mitral valve. There is moderate thickening of the mitral valve  leaflet(s). There is moderate calcification of the mitral valve leaflet(s). Normal mobility of the mitral valve leaflets. Moderate mitral annular calcification. Moderate mitral valve regurgitation. No evidence of mitral valve stenosis. MV peak gradient, 15.7 mmHg. The mean mitral valve gradient is 4.0 mmHg. Tricuspid Valve: The tricuspid valve is normal in structure. Tricuspid valve regurgitation is mild . No evidence of tricuspid stenosis. Aortic Valve: The aortic valve is grossly normal. Aortic valve regurgitation is not visualized. Mild aortic stenosis is present. Aortic valve mean gradient measures 11.0 mmHg. Aortic valve peak gradient measures 21.3 mmHg. Aortic valve area, by VTI measures 1.35 cm. Pulmonic Valve: The pulmonic valve was grossly normal. Pulmonic valve regurgitation is trivial. No evidence of pulmonic stenosis. Aorta: The aortic root is normal in size and structure. Venous: The inferior vena cava is normal in size with greater than 50% respiratory variability, suggesting right atrial pressure of 3 mmHg. IAS/Shunts: No atrial level shunt detected by color flow Doppler. Additional Comments: Thicken of anterioe MV leafet Vegation can not be excluded. Probable just calcium build up.  LEFT VENTRICLE PLAX 2D LVIDd:         3.63 cm  Diastology LVIDs:         2.00 cm  LV e' lateral:   3.81 cm/s LV PW:         1.40 cm  LV E/e' lateral: 44.6 LV IVS:        0.67 cm  LV e' medial:    3.59 cm/s LVOT diam:     1.90 cm  LV E/e' medial:  47.4 LV SV:         53 LV SV Index:   38 LVOT Area:     2.84 cm  LEFT ATRIUM           Index LA diam:      4.90 cm 3.48 cm/m LA Vol (A2C): 24.2 ml 17.17 ml/m LA Vol (A4C): 77.2 ml 54.79 ml/m  AORTIC VALVE                    PULMONIC VALVE AV Area (Vmax):    1.46 cm     PV Vmax:       1.38 m/s AV Area (Vmean):   1.44 cm     PV Vmean:      96.400 cm/s AV Area (VTI):     1.35 cm     PV VTI:        0.222 m AV Vmax:           231.00 cm/s  PV  Peak grad:  7.6 mmHg AV Vmean:           152.500 cm/s PV Mean grad:  4.0 mmHg AV VTI:            0.392 m AV Peak Grad:      21.3 mmHg AV Mean Grad:      11.0 mmHg LVOT Vmax:         119.00 cm/s LVOT Vmean:        77.400 cm/s LVOT VTI:          0.187 m LVOT/AV VTI ratio: 0.48  AORTA Ao Root diam: 2.90 cm MITRAL VALVE MV Area (PHT): 2.60 cm     SHUNTS MV Peak grad:  15.7 mmHg    Systemic VTI:  0.19 m MV Mean grad:  4.0 mmHg     Systemic Diam: 1.90 cm MV Vmax:       1.98 m/s MV Vmean:      94.8 cm/s MV Decel Time: 292 msec MV E velocity: 170.00 cm/s Julien Nordmann MD Electronically signed by Julien Nordmann MD Signature Date/Time: 02/17/2020/4:22:18 PM    Final      Labs:   Basic Metabolic Panel: Recent Labs  Lab 02/16/20 0844 02/16/20 0844 02/17/20 0433 02/18/20 0655  NA 134*  --  134* 138  K 4.1   < > 3.7 3.9  CL 99  --  103 106  CO2 27  --  27 26  GLUCOSE 126*  --  108* 111*  BUN 16  --  19 24*  CREATININE 0.99  --  0.97 0.88  CALCIUM 8.7*  --  8.0* 8.1*  MG  --   --   --  2.0  PHOS  --   --   --  2.1*   < > = values in this interval not displayed.   GFR Estimated Creatinine Clearance: 30.5 mL/min (by C-G formula based on SCr of 0.88 mg/dL). Liver Function Tests: Recent Labs  Lab 02/16/20 0844 02/18/20 0655  AST 27 25  ALT 14 14  ALKPHOS 61 58  BILITOT 1.0 0.7  PROT 6.9 5.4*  ALBUMIN 3.3* 2.5*   No results for input(s): LIPASE, AMYLASE in the last 168 hours. No results for input(s): AMMONIA in the last 168 hours. Coagulation profile No results for input(s): INR, PROTIME in the last 168 hours.  CBC: Recent Labs  Lab 02/16/20 0844 02/17/20 0433 02/18/20 0655  WBC 14.4* 18.4* 10.1  NEUTROABS 12.8*  --   --   HGB 12.8 11.5* 12.1  HCT 38.3 34.8* 37.1  MCV 96.7 96.7 98.1  PLT 275 240 249   Cardiac Enzymes: No results for input(s): CKTOTAL, CKMB, CKMBINDEX, TROPONINI in the last 168 hours. BNP: Invalid input(s): POCBNP CBG: No results for input(s): GLUCAP in the last 168 hours. D-Dimer No results  for input(s): DDIMER in the last 72 hours. Hgb A1c Recent Labs    02/17/20 0433  HGBA1C 5.1   Lipid Profile Recent Labs    02/17/20 0433  CHOL 105  HDL 29*  LDLCALC 68  TRIG 42  CHOLHDL 3.6   Thyroid function studies No results for input(s): TSH, T4TOTAL, T3FREE, THYROIDAB in the last 72 hours.  Invalid input(s): FREET3 Anemia work up No results for input(s): VITAMINB12, FOLATE, FERRITIN, TIBC, IRON, RETICCTPCT in the last 72 hours. Microbiology Recent Results (from the past 240 hour(s))  Urine culture     Status: Abnormal   Collection Time: 02/16/20 10:57 AM   Specimen: Urine,  Random  Result Value Ref Range Status   Specimen Description   Final    URINE, RANDOM Performed at [facility], [address], [place], [place] [address]    Special Requests   Final    NONE Performed at [facility], [address] [place], [place] [address]    Culture (A)  Final    <10,000 COLONIES/mL INSIGNIFICANT GROWTH Performed at [facility], [address], [place], [place] [address]    Report Status 02/18/2020 FINAL  Final  Blood culture (routine x 2)     Status: None (Preliminary result)   Collection Time: 02/16/20 11:26 AM   Specimen: BLOOD  Result Value Ref Range Status   Specimen Description BLOOD RAC  Final   Special Requests   Final    BOTTLES DRAWN AEROBIC AND ANAEROBIC Blood Culture adequate volume   Culture   Final    NO GROWTH 2 DAYS Performed at [facility], [address], [place], [place] [address]    Report Status PENDING  Incomplete  Blood culture (routine x 2)     Status: None (Preliminary result)   Collection Time: 02/16/20 11:26 AM   Specimen: BLOOD  Result Value Ref Range Status   Specimen Description BLOOD RIGHT HAND  Final   Special Requests   Final    BOTTLES DRAWN AEROBIC AND ANAEROBIC Blood Culture results may not be optimal due to an inadequate volume of blood received in culture bottles   Culture   Final     NO GROWTH 2 DAYS Performed at [facility], [address] [place], [place] [address]    Report Status PENDING  Incomplete  SARS Coronavirus 2 by RT PCR (hospital order, performed in [facility] hospital lab) Nasopharyngeal Nasopharyngeal Swab     Status: None   Collection Time: 02/16/20 12:16 PM   Specimen: Nasopharyngeal Swab  Result Value Ref Range Status   SARS Coronavirus 2 NEGATIVE NEGATIVE Final    Comment: (NOTE) SARS-CoV-2 target nucleic acids are NOT DETECTED.  The SARS-CoV-2 RNA is generally detectable in upper and lower respiratory specimens during the acute phase of infection. The lowest concentration of SARS-CoV-2 viral copies this assay can detect is 250 copies / mL. A negative result does not preclude SARS-CoV-2 infection and should not be used as the sole basis for treatment or other patient management decisions.  A negative result may occur with improper specimen collection / handling, submission of specimen other than nasopharyngeal swab, presence of viral mutation(s) within the areas targeted by this assay, and inadequate number of viral copies (<250 copies / mL). A negative result must be combined with clinical observations, patient history, and epidemiological information.  Fact Sheet for Patients:   BoilerBrush.com.cy  Fact Sheet for Healthcare Providers: https://pope.com/  This test is not yet approved or  cleared by the [place] FDA and has been authorized for detection and/or diagnosis of SARS-CoV-2 by FDA under an Emergency Use Authorization (EUA).  This EUA will remain in effect (meaning this test can be used) for the duration of the COVID-19 declaration under Section 564(b)(1) of the Act, 21 U.S.C. section 360bbb-3(b)(1), unless the authorization is terminated or revoked sooner.  Performed at [facility], [address] [place], [place] [address]   Group A Strep by PCR      Status: None   Collection Time: 02/16/20  6:38 PM   Specimen: Throat; Sterile Swab  Result Value Ref Range Status   Group  A Strep by PCR NOT DETECTED NOT DETECTED Final    Comment: Performed at [facility], [address] [place], [place] [address]     Discharge Instructions:   Discharge Instructions    Call MD for:   Complete by: As directed    Worsening symptoms   Call MD for:  temperature >100.4   Complete by: As directed    Diet - low sodium heart healthy   Complete by: As directed    Discharge instructions   Complete by: As directed    Follow-up with your primary care provider in 1 week.  Check blood work at that time.  Complete the course of antibiotic.  Seek medical attention for worsening symptoms.   Increase activity slowly   Complete by: As directed      Allergies as of 02/18/2020      Reactions   Codeine    REACTION: N \\T \ V   Statins    REACTION: INTOLERANCE: muscle aches   Sulfonamide Derivatives    REACTION: N \\T \ V      Medication List    TAKE these medications   amoxicillin-clavulanate 875-125 MG tablet Commonly known as: Augmentin Take 1 tablet by mouth 2 (two) times daily for 3 days.   apixaban 2.5 MG Tabs tablet Commonly known as: ELIQUIS Take 1 tablet (2.5 mg total) by mouth 2 (two) times daily.   ezetimibe 10 MG tablet Commonly known as: ZETIA TAKE 1 TABLET BY MOUTH  DAILY   furosemide 20 MG tablet Commonly known as: LASIX TAKE 1 TABLET BY MOUTH  DAILY AS NEEDED FOR FLUID  RETENTION What changed: See the new instructions.   metoprolol succinate 50 MG 24 hr tablet Commonly known as: TOPROL-XL TAKE 1 TABLET BY MOUTH  DAILY WITH OR IMMEDIATELY  FOLLOWING A MEAL What changed: See the new instructions.   multivitamin tablet Take 1 tablet by mouth daily.   potassium chloride 10 MEQ tablet Commonly known as: KLOR-CON Take 1 tablet (10 mEq total) by mouth daily as needed.         Time coordinating discharge: 39  minutes  Signed:  Lani Havlik  [facility] 02/18/2020, 12:09 PM

## 2020-02-18 NOTE — Progress Notes (Signed)
Discharge instructions reviewed with patient and given to family members,  Daughter Erskine Squibb. All questions answered. Pt remains afebrile and VS stable upon D/C. Patient D/C from 254 via wc accompanied by family member with belongings.

## 2020-02-18 NOTE — Discharge Instructions (Signed)
Confusion °Confusion is the inability to think with the usual speed or clarity. People who are confused often describe their thinking as cloudy or unclear. Confusion can also include feeling disoriented. This means you are unaware of where you are or who you are. You may also not know the date or time. When confused, you may have difficulty remembering, paying attention, or making decisions. Some people also act aggressively when they are confused. °In some cases, confusion may come on quickly. In other cases, it may develop slowly over time. How quickly confusion comes on depends on the cause. °Confusion may be caused by: °· Head injury (concussion). °· Seizures. °· Stroke. °· Fever. °· Brain tumor. °· Decrease in brain function due to a vascular or neurologic condition (dementia). °· Emotions, like rage or terror. °· Inability to know what is real and what is not (hallucinations). °· Infections, such as a urinary tract infection (UTI). °· Using too much alcohol, drugs, or medicines. °· Loss of fluid (dehydration) or an imbalance of salts in the body (electrolytes). °· Lack of sleep. °· Low blood sugar (diabetes). °· Low levels of oxygen. This comes from conditions such as chronic lung disorders. °· Side effects of medicines, or taking medicines that affect other medicines (drug interactions). °· Lack of certain nutrients, especially niacin, thiamine, vitamin C, or vitamin B. °· Sudden drop in body temperature (hypothermia). °· Change in routine, such as traveling or being hospitalized. °Follow these instructions at home: °Pay attention to your symptoms. Tell your health care provider about any changes or if you develop new symptoms. Follow these instructions to control or treat symptoms. Ask a family member or friend for help if needed. °Medicines °· Take over-the-counter and prescription medicines only as told by your health care provider. °· Ask your health care provider about changing or stopping any medicines  that may be causing your confusion. °· Avoid pain medicines or sleep medicines until you have fully recovered. °· Use a pillbox or an alarm to help you take the right medicines at the right time. °Lifestyle ° °· Eat a balanced diet that includes fruits and vegetables. °· Get enough sleep. For most adults, this is 7-9 hours each night. °· Do not drink alcohol. °· Do not become isolated. Spend time with other people and make plans for your days. °· Do not drive until your health care provider says that it is safe to do so. °· Do not use any products that contain nicotine or tobacco, such as cigarettes and e-cigarettes. If you need help quitting, ask your health care provider. °· Stop other activities that may increase your chances of getting hurt. These may include some work duties, sports activities, swimming, or bike riding. Ask your health care provider what activities are safe for you. °What caregivers can do °· Find out if the person is confused. Ask the person to state his or her name, age, and the date. If the person is unsure or answers incorrectly, he or she may be confused. °· Always introduce yourself, no matter how well the person knows you. °· Remind the person of his or her location. Do this often. °· Place a calendar and clock near the person who is confused. °· Talk about current events and plans for the day. °· Keep the environment calm, quiet, and peaceful. °· Help the person do the things that he or she is unable to do. These include: °? Taking medicines. °? Keeping follow-up visits with his or her health care   provider. ? Helping with household duties, including meal preparation. ? Running errands.  Get help if you need it. There are several support groups for caregivers.  If the person you are helping needs more support, consider day care, extended care programs, or a skilled nursing facility. The person's health care provider may be able to help evaluate these options. General  instructions  Monitor yourself for any conditions you may have. These may include: ? Checking your blood glucose levels, if you have diabetes. ? Watching your weight, if you are overweight. ? Monitoring your blood pressure, if you have hypertension. ? Monitoring your body temperature, if you have a fever.  Keep all follow-up visits as told by your health care provider. This is important. Contact a health care provider if:  Your symptoms get worse. Get help right away if you:  Feel that you are not able to care for yourself.  Develop severe headaches, repeated vomiting, seizures, blackouts, or slurred speech.  Have increasing confusion, weakness, numbness, restlessness, or personality changes.  Develop a loss of balance, have marked dizziness, feel uncoordinated, or fall.  Develop severe anxiety, or you have delusions or hallucinations. These symptoms may represent a serious problem that is an emergency. Do not wait to see if the symptoms will go away. Get medical help right away. Call your local emergency services (911 in the [place] Do not drive yourself to the hospital. Summary  Confusion is the inability to think with the usual speed or clarity. People who are confused often describe their thinking as cloudy or unclear.  Confusion can also include having difficulty remembering, paying attention, or making decisions.  Confusion may come on quickly or develop slowly over time, depending on the cause. There are many different causes of confusion.  Ask for help from family members or friends if you are unable to take care of yourself. This information is not intended to replace advice given to you by your health care provider. Make sure you discuss any questions you have with your health care provider. Document Revised: 08/06/2017 Document Reviewed: 08/06/2017 Elsevier Patient Education  2020 [employer]   Sepsis, Self Care, Adult Sepsis is a serious illness that may require  intensive care in the hospital. The following information explains what you need to know in order to manage your condition after you are discharged from the hospital. What are the risks? After being treated for sepsis and discharged from the hospital, you may be at a higher risk for certain problems. These problems may be physical or mental. Physical problems:  Weakness and tiredness.  Shortness of breath.  Pain in many areas of the body.  Difficulty walking.  Dry, itchy skin.  Lack of appetite. This may lead to weight loss.  Organ failure. Mental problems:  Difficulty sleeping.  Depression.  Confusion.  Anxiety and worry caused by having gone through a bad experience (post-traumatic stress disorder,PTSD).  Low self-esteem. Follow these instructions at home: Medicines  Take over-the-counter and prescription medicines only as told by your health care provider.  If you were prescribed an antibiotic, antiviral, or antifungal medicine, take it as told by your health care provider. Do not stop taking the medicine even if you start to feel better. Eating and drinking   Eat a healthy diet that includes plenty of vegetables, fruits, whole grains, low-fat dairy products, and lean protein. Ask your health care provider if you should avoid certain foods.  Drink enough fluid to keep your urine pale yellow. Alcohol use  Do not drink alcohol if: ? Your health care provider tells you not to drink. ? You are pregnant, may be pregnant, or are planning to become pregnant.  If you drink alcohol, limit how much you use to: ? 0-1 drink a day for women. ? 0-2 drinks a day for men.  Be aware of how much alcohol is in your drink. In the [place] one drink equals one 12 oz bottle of beer (355 mL), one 5 oz glass of wine (148 mL), or one 1 oz glass of hard liquor (44 mL). Activity  Rest and gradually return to your normal activities. Ask your health care provider what activities are safe  for you.  Avoid sitting for a long time without moving. Get up to take short walks every 1-2 hours. This is important to improve blood flow and breathing. Ask for help if you feel weak or unsteady.  Try to set small, achievable goals each week, such as dressing yourself, bathing, or walking up the stairs. It may take a while to rebuild your strength.  Try to exercise regularly if you feel healthy enough to do so. Ask your health care provider what exercises are safe for you. Preventing infection   Keep your vaccinations up to date. Get the flu shot every year.  Wash your hands often using soap and water. Use hand sanitizer if soap and water are not available.  Practice good hygiene. Keep cuts clean and covered until healed. Managing stress Talk with your health care provider or counselor about ways to reduce stress. He or she may suggest:  Meditation, muscle relaxation, and breathing exercises.  Talk therapy.  Spending time on hobbies and activities that you enjoy. General instructions  Get the right amount and quality of sleep. Most adults need 7-9 hours of sleep each night. To help with sleep: ? Keep your bedroom cool and dark. ? Do not eat a heavy meal within one hour of bedtime. ? Do not drink alcohol or caffeinated drinks before bed. ? Avoid screen time, such as television, computers, tablets, or cell phones before bed.  Do not use any products that contain nicotine or tobacco, such as cigarettes, e-cigarettes, and chewing tobacco. If you need help quitting, ask your health care provider.  Talk to trusted family members and friends about your condition. Explain your symptoms to them, and let them know that you are working with a health care provider to treat your condition. This can provide you with one way to get support and guidance.  Keep all follow-up visits as told by your health care provider. This is important. Questions to ask your health care provider:  What  physical and emotional changes do I need to report?  Do I need to have someone with me all the time?  Is it safe for me to drive? Contact a health care provider if you:  Do not feel like you are getting better or regaining strength.  Have muscle or joint pain.  Frequently feel tired.  Are having trouble coping with your recovery.  Have nightmares, or trouble falling asleep or staying asleep.  Feel sad, down, or depressed more often than not, every day for more than 2 weeks.  Have difficulty concentrating.  Feel irritable or you cry for no reason. Get help right away if you:  Have difficulty breathing.  Have a rapid or skipping heartbeat.  Become confused or disoriented.  See, hear, or feel things that do not exist (hallucinations).  Have a high  fever.  Have an infection that is getting worse or not getting better.  You have thoughts of hurting yourself or others. If you ever feel like you may hurt yourself or others, or have thoughts about taking your own life, get help right away. You can go to your nearest emergency department or call:  Your local emergency services (911 in the [place]  A suicide crisis helpline, such as the National Suicide Prevention Lifeline at 970-770-3009. This is open 24 hours a day. Summary  Sepsis is a serious illness that may require intensive care in a hospital. You may experience long-term health effects after you are discharged from the hospital.  Try to set small, achievable goals each week, such as dressing yourself, bathing, or walking up the stairs. It may take a while to rebuild your strength.  Keep all follow-up visits as told by your health care provider. This is important.  Know what symptoms you should get help right away for. This information is not intended to replace advice given to you by your health care provider. Make sure you discuss any questions you have with your health care provider. Document Revised: 03/12/2018  Document Reviewed: 03/12/2018 Elsevier Patient Education  2020 [employer].

## 2020-02-21 ENCOUNTER — Telehealth: Payer: Self-pay

## 2020-02-21 LAB — CULTURE, BLOOD (ROUTINE X 2)
Culture: NO GROWTH
Culture: NO GROWTH
Special Requests: ADEQUATE

## 2020-02-21 NOTE — Telephone Encounter (Signed)
Unable to reach patient for transition of care. No answer. Left a message stating my reason for calling and that I will call back tomorrow. Currently scheduled with PMD 02/27/20 @ 11:00.  Keep all scheduled appointments.

## 2020-02-22 NOTE — Telephone Encounter (Signed)
Transition Care Management Follow-up Telephone Call  Date of discharge and from where: 02/18/20 from [facility]  How have you been since you were released from the hospital? "My daughter in law is staying with me and monitoring my vital signs. V/S are within normal limits."  Denies confusion, dizziness, pain, fever, chills, shortness of breath, palpitations and all other symptoms. Patient alert and oriented x3, aware of who nurse is by name and previous awv appointments. Denies home with HH and states," I would rather not have it."  Any questions or concerns? Yes , patient states, "The hospital was pushing me to go to rehab, I declined before but will go now if I need to, maybe they know something I don't. I can discuss this with Dr. Darrick Huntsman at my appointment on Monday."   Items Reviewed:  Did the pt receive and understand the discharge instructions provided? Yes , increase activity slowly.   Medications obtained and verified? Yes . Antibiotic complete today. Taking all scheduled medications, no issues.  Any new allergies since your discharge? No   Dietary orders reviewed? Low sodium, heart healthy  Do you have support at home? Yes , daughter in law  Functional Questionnaire: (I = Independent and D = Dependent) ADLs: i  Bathing/Dressing- i  Meal Prep- d- daughter in law  Eating- i  Maintaining continence- i  Transferring/Ambulation- i  Managing Meds- i  Follow up appointments reviewed:   PCP Hospital f/u appt confirmed? Yes  Scheduled to see Dr. Darrick Huntsman on 02/27/20 @ 11:00.  Specialist Hospital f/u appt confirmed? No  Agrees to schedule with Cardiology in about 3-4 weeks, per discharge note.  Patient recalls speaking with her cardiologist during hospital stay.    Are transportation arrangements needed? No   If their condition worsens, is the pt aware to call PCP or go to the Emergency Dept.? Yes  Was the patient provided with contact information for the PCP's office or ED? Yes  Was  to pt encouraged to call back with questions or concerns? Yes

## 2020-02-23 NOTE — Progress Notes (Signed)
Prolia has been scheduled.

## 2020-02-27 ENCOUNTER — Encounter: Payer: Self-pay | Admitting: Internal Medicine

## 2020-02-27 ENCOUNTER — Other Ambulatory Visit: Payer: Self-pay

## 2020-02-27 ENCOUNTER — Ambulatory Visit (INDEPENDENT_AMBULATORY_CARE_PROVIDER_SITE_OTHER): Payer: Medicare Other | Admitting: Internal Medicine

## 2020-02-27 VITALS — BP 122/80 | HR 66 | Temp 98.3°F | Ht 60.0 in | Wt 106.0 lb

## 2020-02-27 DIAGNOSIS — R634 Abnormal weight loss: Secondary | ICD-10-CM | POA: Diagnosis not present

## 2020-02-27 DIAGNOSIS — M810 Age-related osteoporosis without current pathological fracture: Secondary | ICD-10-CM

## 2020-02-27 DIAGNOSIS — G9341 Metabolic encephalopathy: Secondary | ICD-10-CM | POA: Diagnosis not present

## 2020-02-27 DIAGNOSIS — Z09 Encounter for follow-up examination after completed treatment for conditions other than malignant neoplasm: Secondary | ICD-10-CM

## 2020-02-27 MED ORDER — DENOSUMAB 60 MG/ML ~~LOC~~ SOSY
60.0000 mg | PREFILLED_SYRINGE | SUBCUTANEOUS | Status: AC
  Administered 2020-02-27: 60 mg via SUBCUTANEOUS

## 2020-02-27 NOTE — Assessment & Plan Note (Signed)
Secondary to isolation during pandemic.  Improving

## 2020-02-27 NOTE — Patient Instructions (Addendum)
IT APPEARS THAT YOU HAD A MILD SINUS INFECTION.  THE AUGMENTIN HAS RESOLVED THIS   Continued Daily use of Probiotics for  a total of   3 weeks is advised to reduce risk of C dificile colitis.    Your blood pressure is currently fine.  Continue the metoprolol  For your constipation:  Consider using glycerin suppository for problems with passing stool  IDEALLY YOU NEED 60 ounces of water daily MINIMUM .  WORK UP TO IT GRADUALLY

## 2020-02-27 NOTE — Progress Notes (Signed)
Subjective:  Patient ID: Brandi [name], female    DOB: November 17, 1926  Age: 86 y.o. MRN: [id number]  CC: The primary encounter diagnosis was Age-related osteoporosis without current pathological fracture. Diagnoses of Abnormal intentional weight loss, Hospital discharge follow-up, and Acute metabolic encephalopathy were also pertinent to this visit.  HPI Brandi [name] presents for hospital follow up  This visit occurred during the SARS-CoV-2 public health emergency.  Safety protocols were in place, including screening questions prior to the visit, additional usage of staff PPE, and extensive cleaning of exam room while observing appropriate contact time as indicated for disinfecting solutions.     Brandi [name] is a 86 yr old healthy, mentally competent widow who lives independently at [facility].  She was admitted to [facility] on July 1 with delirium/metabolic encephalopathy,  SIRS criteria met.   .  Infectious workup included chest x ray,  blood cultures,  Covid/Flu screen and UA . Head CT was also done to rule out stroke.   WBC, troponin I  and BNP were both elevated . EKG no acute changes and ECHO as well.  CT normal for age  except for right sphenoid sinusitis on CT brain .  (no symptoms )  EMPIRIC VANCOMYCIN AND CEFEPIME WERE STARTED BUT STOPPED and she was discharged on July 3 with empiric Augmentin   Patient remembers waking up that morning oriented to place and day, but not sure "what she was supposed to do next" so she went next door to her neighbor and neighbor called 911   Step daughter has accompanied her today. Has been staying with her since the discharge.  Patient and family member agree that she has made a full recovery and is  Back to normal today.  Constipated. Now taking miralax and metamucil.   Walked 1 mile briskly today with no problem.  Has a new DNR form for signature ("they kept my old one.")  Outpatient Medications Prior to Visit  Medication Sig Dispense Refill  . apixaban (ELIQUIS)  2.5 MG TABS tablet Take 1 tablet (2.5 mg total) by mouth 2 (two) times daily. 180 tablet 2  . ezetimibe (ZETIA) 10 MG tablet TAKE 1 TABLET BY MOUTH  DAILY (Patient taking differently: Take 10 mg by mouth daily. ) 90 tablet 3  . furosemide (LASIX) 20 MG tablet TAKE 1 TABLET BY MOUTH  DAILY AS NEEDED FOR FLUID  RETENTION (Patient taking differently: Take 20 mg by mouth daily. ) 90 tablet 2  . metoprolol succinate (TOPROL-XL) 50 MG 24 hr tablet TAKE 1 TABLET BY MOUTH  DAILY WITH OR IMMEDIATELY  FOLLOWING A MEAL (Patient taking differently: Take 50 mg by mouth daily. ) 90 tablet 1  . Multiple Vitamin (MULTIVITAMIN) tablet Take 1 tablet by mouth daily.      . potassium chloride (KLOR-CON) 10 MEQ tablet Take 1 tablet (10 mEq total) by mouth daily as needed. 30 tablet 1   No facility-administered medications prior to visit.    Review of Systems;  Patient denies headache, fevers, malaise, unintentional weight loss, skin rash, eye pain, sinus congestion and sinus pain, sore throat, dysphagia,  hemoptysis , cough, dyspnea, wheezing, chest pain, palpitations, orthopnea, edema, abdominal pain, nausea, melena, diarrhea, constipation, flank pain, dysuria, hematuria, urinary  Frequency, nocturia, numbness, tingling, seizures,  Focal weakness, Loss of consciousness,  Tremor, insomnia, depression, anxiety, and suicidal ideation.      Objective:  BP 122/80   Pulse 66   Temp 98.3 F (36.8 C) (Oral)  Ht 5' (1.524 m)   Wt 106 lb (48.1 kg)   SpO2 98%   BMI 20.70 kg/m   BP Readings from Last 3 Encounters:  02/27/20 122/80  02/18/20 138/65  02/08/20 (!) 120/52    Wt Readings from Last 3 Encounters:  02/27/20 106 lb (48.1 kg)  02/18/20 104 lb (47.2 kg)  02/08/20 105 lb 3.2 oz (47.7 kg)    General appearance: alert, cooperative and appears stated age Ears: normal TM's and external ear canals both ears Throat: lips, mucosa, and tongue normal; teeth and gums normal Neck: no adenopathy, no carotid  bruit, supple, symmetrical, trachea midline and thyroid not enlarged, symmetric, no tenderness/mass/nodules Back: symmetric, no curvature. ROM normal. No CVA tenderness. Lungs: clear to auscultation bilaterally Heart: regular rate and rhythm, S1, S2 normal, no murmur, click, rub or gallop Abdomen: soft, non-tender; bowel sounds normal; no masses,  no organomegaly Pulses: 2+ and symmetric Skin: Skin color, texture, turgor normal. No rashes or lesions Lymph nodes: Cervical, supraclavicular, and axillary nodes normal.  Lab Results  Component Value Date   HGBA1C 5.1 02/17/2020   HGBA1C 5.5 05/22/2017    Lab Results  Component Value Date   CREATININE 0.88 02/18/2020   CREATININE 0.97 02/17/2020   CREATININE 0.99 02/16/2020    Lab Results  Component Value Date   WBC 10.1 02/18/2020   HGB 12.1 02/18/2020   HCT 37.1 02/18/2020   PLT 249 02/18/2020   GLUCOSE 111 (H) 02/18/2020   CHOL 105 02/17/2020   TRIG 42 02/17/2020   HDL 29 (L) 02/17/2020   LDLDIRECT 91.0 10/31/2016   LDLCALC 68 02/17/2020   ALT 14 02/18/2020   AST 25 02/18/2020   NA 138 02/18/2020   K 3.9 02/18/2020   CL 106 02/18/2020   CREATININE 0.88 02/18/2020   BUN 24 (H) 02/18/2020   CO2 26 02/18/2020   TSH 1.51 02/06/2020   HGBA1C 5.1 02/17/2020    ECHOCARDIOGRAM COMPLETE  Result Date: 02/17/2020    ECHOCARDIOGRAM REPORT   Patient Name:   Brandi [name] [facility] Date of Exam: 02/17/2020 Medical Rec #:  [id number]    Height:       60.0 in Accession #:    [id number]   Weight:       103.2 lb Date of Birth:  03-19-1927   BSA:          1.409 m Patient Age:    83 years     BP:           133/75 mmHg Patient Gender: F            HR:           80 bpm. Exam Location:  [facility] Procedure: 2D Echo, Color Doppler and Cardiac Doppler Indications:     R01.2 Abnormal Heart Sounds Nec  History:         Patient has prior history of Echocardiogram examinations.                  Mitral Valve Disease; Risk Factors:Dyslipidemia.  Sonographer:     Charmayne Sheer RDCS (AE) Referring Phys:  [place] Diagnosing Phys: Ida Rogue MD IMPRESSIONS  1. Thicken of anterioe MV leafet Vegation can not be excluded. Probable just calcium build up.  2. Left ventricular ejection fraction, by estimation, is 60 to 65%. The left ventricle has normal function. The left ventricle has no regional wall motion abnormalities. There is mild left ventricular hypertrophy. Left ventricular diastolic parameters are  consistent with Grade I diastolic dysfunction (impaired relaxation).  3. Right ventricular systolic function is normal. The right ventricular size is normal.  4. Left atrial size was moderately dilated.  5. 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 No evidence of mitral stenosis.  6. The aortic valve is grossly normal. Aortic valve regurgitation is not visualized. Mild aortic valve stenosis. FINDINGS  Left Ventricle: Left ventricular ejection fraction, by estimation, is 60 to 65%. The left ventricle has normal function. The left ventricle has no regional wall motion abnormalities. The left ventricular internal cavity size was normal in size. There is  mild left ventricular hypertrophy. Left ventricular diastolic parameters are consistent with Grade I diastolic dysfunction (impaired relaxation). Right Ventricle: The right ventricular size is normal. No increase in right ventricular wall thickness. Right ventricular systolic function is normal. Left Atrium: Left atrial size was moderately dilated. Right Atrium: Right atrial size was normal in size. Pericardium: There is no evidence of pericardial effusion. Mitral Valve: The mitral valve is myxomatous. There is moderate prolapse of of the mitral valve. There is moderate thickening of the mitral valve leaflet(s). There is moderate calcification of the mitral valve leaflet(s). Normal mobility of the mitral valve  leaflets. Moderate mitral annular calcification. Moderate mitral valve regurgitation. No evidence of mitral valve stenosis. MV peak gradient, 15.7 mmHg. The mean mitral valve gradient is 4.0 mmHg. Tricuspid Valve: The tricuspid valve is normal in structure. Tricuspid valve regurgitation is mild . No evidence of tricuspid stenosis. Aortic Valve: The aortic valve is grossly normal. Aortic valve regurgitation is not visualized. Mild aortic stenosis is present. Aortic valve mean gradient measures 11.0 mmHg. Aortic valve peak gradient measures 21.3 mmHg. Aortic valve area, by VTI measures 1.35 cm. Pulmonic Valve: The pulmonic valve was grossly normal. Pulmonic valve regurgitation is trivial. No evidence of pulmonic stenosis. Aorta: The aortic root is normal in size and structure. Venous: The inferior vena cava is normal in size with greater than 50% respiratory variability, suggesting right atrial pressure of 3 mmHg. IAS/Shunts: No atrial level shunt detected by color flow Doppler. Additional Comments: Thicken of anterioe MV leafet Vegation can not be excluded. Probable just calcium build up.  LEFT VENTRICLE PLAX 2D LVIDd:         3.63 cm  Diastology LVIDs:         2.00 cm  LV e' lateral:   3.81 cm/s LV PW:         1.40 cm  LV E/e' lateral: 44.6 LV IVS:        0.67 cm  LV e' medial:    3.59 cm/s LVOT diam:     1.90 cm  LV E/e' medial:  47.4 LV SV:         53 LV SV Index:   38 LVOT Area:     2.84 cm  LEFT ATRIUM           Index LA diam:      4.90 cm 3.48 cm/m LA Vol (A2C): 24.2 ml 17.17 ml/m LA Vol (A4C): 77.2 ml 54.79 ml/m  AORTIC VALVE                    PULMONIC VALVE AV Area (Vmax):    1.46 cm     PV Vmax:       1.38 m/s AV Area (Vmean):   1.44 cm     PV Vmean:  96.400 cm/s AV Area (VTI):     1.35 cm     PV VTI:        0.222 m AV Vmax:           231.00 cm/s  PV Peak grad:  7.6 mmHg AV Vmean:          152.500 cm/s PV Mean grad:  4.0 mmHg AV VTI:            0.392 m AV Peak Grad:      21.3 mmHg AV Mean Grad:       11.0 mmHg LVOT Vmax:         119.00 cm/s LVOT Vmean:        77.400 cm/s LVOT VTI:          0.187 m LVOT/AV VTI ratio: 0.48  AORTA Ao Root diam: 2.90 cm MITRAL VALVE MV Area (PHT): 2.60 cm     SHUNTS MV Peak grad:  15.7 mmHg    Systemic VTI:  0.19 m MV Mean grad:  4.0 mmHg     Systemic Diam: 1.90 cm MV Vmax:       1.98 m/s MV Vmean:      94.8 cm/s MV Decel Time: 292 msec MV E velocity: 170.00 cm/s Ida Rogue MD Electronically signed by Ida Rogue MD Signature Date/Time: 02/17/2020/4:22:18 PM    Final     Assessment & Plan:   Problem List Items Addressed This Visit      Unprioritized   Osteoporosis - Primary   Relevant Medications   denosumab (PROLIA) injection 60 mg   Acute metabolic encephalopathy    Trigger appears to have been sinusitis.  Now resolved,  Mental status is back to baseline.       Abnormal intentional weight loss    Secondary to isolation during pandemic.  Improving       Hospital discharge follow-up    Patient is stable post discharge and has no new issues or questions about discharge plans at the visit today for hospital follow up. All labs , imaging studies and progress notes from admission were reviewed with patient today           I am having Brandi Katayama. [name] maintain her multivitamin, furosemide, ezetimibe, apixaban, potassium chloride, and metoprolol succinate. We administered denosumab. We will continue to administer denosumab.  Meds ordered this encounter  Medications  . denosumab (PROLIA) injection 60 mg    There are no discontinued medications.  Follow-up: Return in about 6 months (around 08/29/2020).   Crecencio Mc, MD

## 2020-02-28 DIAGNOSIS — Z09 Encounter for follow-up examination after completed treatment for conditions other than malignant neoplasm: Secondary | ICD-10-CM | POA: Insufficient documentation

## 2020-02-28 NOTE — Assessment & Plan Note (Signed)
Patient is stable post discharge and has no new issues or questions about discharge plans at the visit today for hospital follow up. All labs , imaging studies and progress notes from admission were reviewed with patient today   

## 2020-02-28 NOTE — Assessment & Plan Note (Signed)
Trigger appears to have been sinusitis.  Now resolved,  Mental status is back to baseline.

## 2020-03-09 ENCOUNTER — Encounter: Payer: Self-pay | Admitting: Internal Medicine

## 2020-03-09 ENCOUNTER — Other Ambulatory Visit: Payer: Self-pay

## 2020-03-09 ENCOUNTER — Ambulatory Visit (INDEPENDENT_AMBULATORY_CARE_PROVIDER_SITE_OTHER): Payer: Medicare Other | Admitting: Internal Medicine

## 2020-03-09 VITALS — BP 128/72 | HR 51 | Temp 97.6°F | Resp 14 | Ht 60.0 in | Wt 101.6 lb

## 2020-03-09 DIAGNOSIS — R634 Abnormal weight loss: Secondary | ICD-10-CM | POA: Diagnosis not present

## 2020-03-09 LAB — CBC WITH DIFFERENTIAL/PLATELET
Basophils Absolute: 0.1 10*3/uL (ref 0.0–0.1)
Basophils Relative: 1 % (ref 0.0–3.0)
Eosinophils Absolute: 0.2 10*3/uL (ref 0.0–0.7)
Eosinophils Relative: 2.5 % (ref 0.0–5.0)
HCT: 40.7 % (ref 36.0–46.0)
Hemoglobin: 13.6 g/dL (ref 12.0–15.0)
Lymphocytes Relative: 20.3 % (ref 12.0–46.0)
Lymphs Abs: 1.3 10*3/uL (ref 0.7–4.0)
MCHC: 33.3 g/dL (ref 30.0–36.0)
MCV: 97.1 fl (ref 78.0–100.0)
Monocytes Absolute: 0.6 10*3/uL (ref 0.1–1.0)
Monocytes Relative: 9.4 % (ref 3.0–12.0)
Neutro Abs: 4.2 10*3/uL (ref 1.4–7.7)
Neutrophils Relative %: 66.8 % (ref 43.0–77.0)
Platelets: 268 10*3/uL (ref 150.0–400.0)
RBC: 4.19 Mil/uL (ref 3.87–5.11)
RDW: 14.8 % (ref 11.5–15.5)
WBC: 6.3 10*3/uL (ref 4.0–10.5)

## 2020-03-09 LAB — COMPREHENSIVE METABOLIC PANEL
ALT: 14 U/L (ref 0–35)
AST: 27 U/L (ref 0–37)
Albumin: 3.6 g/dL (ref 3.5–5.2)
Alkaline Phosphatase: 75 U/L (ref 39–117)
BUN: 18 mg/dL (ref 6–23)
CO2: 30 mEq/L (ref 19–32)
Calcium: 9.8 mg/dL (ref 8.4–10.5)
Chloride: 102 mEq/L (ref 96–112)
Creatinine, Ser: 0.97 mg/dL (ref 0.40–1.20)
GFR: 53.88 mL/min — ABNORMAL LOW (ref >60.00)
Glucose, Bld: 123 mg/dL — ABNORMAL HIGH (ref 70–99)
Potassium: 4.5 mEq/L (ref 3.5–5.1)
Sodium: 137 mEq/L (ref 135–145)
Total Bilirubin: 0.6 mg/dL (ref 0.2–1.2)
Total Protein: 6.4 g/dL (ref 6.0–8.3)

## 2020-03-09 LAB — TSH: TSH: 2.25 u[IU]/mL (ref 0.35–4.50)

## 2020-03-09 LAB — PHOSPHORUS: Phosphorus: 3.5 mg/dL (ref 2.3–4.6)

## 2020-03-09 NOTE — Patient Instructions (Signed)
DO NOT WORRY!  Your weight loss is explained by:  1) Increased exercise  2) decreased food intake  3) water weight that you gained while you were in the hospital   Increase your protein and fat intake using the Ensure shakes,  Peanut butter,, and cheese!

## 2020-03-09 NOTE — Progress Notes (Signed)
Subjective:  Patient ID: Brandi [name], female    DOB: 07/15/1927  Age: 86 y.o. MRN: [id number]  CC: The primary encounter diagnosis was Unintentional weight loss. A diagnosis of Abnormal intentional weight loss was also pertinent to this visit.  HPI Mertie Haslem [facility] presents for evaluation of weight loss  This visit occurred during the SARS-CoV-2 public health emergency.  Safety protocols were in place, including screening questions prior to the visit, additional usage of staff PPE, and extensive cleaning of exam room while observing appropriate contact time as indicated for disinfecting solutions.   86 yr old female recently hospitalized for AMS,  Infectious and ischemic etiologies ruled out . Seen for hospital follow up last week presents with concern of progressive weight loss since discharge,  She reports that she is averaging 1 lb daily per patient  Weights reviewed:  108 lbs average weight for the last 2 years.  105 lbs on June 23 (last OV prior to admission to [facility] on July 1),  106 lbs at hospital follow up.,  101.6 today   She is "actually feeling good."  Walked a mile yesterday,  Has begun participating in exercise classes and energy level is back to normal discharge.  Appetite is also improving.    Outpatient Medications Prior to Visit  Medication Sig Dispense Refill  . apixaban (ELIQUIS) 2.5 MG TABS tablet Take 1 tablet (2.5 mg total) by mouth 2 (two) times daily. 180 tablet 2  . ezetimibe (ZETIA) 10 MG tablet TAKE 1 TABLET BY MOUTH  DAILY (Patient taking differently: Take 10 mg by mouth daily. ) 90 tablet 3  . furosemide (LASIX) 20 MG tablet TAKE 1 TABLET BY MOUTH  DAILY AS NEEDED FOR FLUID  RETENTION (Patient taking differently: Take 20 mg by mouth daily. ) 90 tablet 2  . metoprolol succinate (TOPROL-XL) 50 MG 24 hr tablet TAKE 1 TABLET BY MOUTH  DAILY WITH OR IMMEDIATELY  FOLLOWING A MEAL (Patient taking differently: Take 50 mg by mouth daily. ) 90 tablet 1  . Multiple Vitamin  (MULTIVITAMIN) tablet Take 1 tablet by mouth daily.      . potassium chloride (KLOR-CON) 10 MEQ tablet Take 1 tablet (10 mEq total) by mouth daily as needed. 30 tablet 1   Facility-Administered Medications Prior to Visit  Medication Dose Route Frequency Provider Last Rate Last Admin  . denosumab (PROLIA) injection 60 mg  60 mg Subcutaneous Q6 months [name], MD   60 mg at 02/27/20 1106    Review of Systems;  Patient denies headache, fevers, malaise, unintentional weight loss, skin rash, eye pain, sinus congestion and sinus pain, sore throat, dysphagia,  hemoptysis , cough, dyspnea, wheezing, chest pain, palpitations, orthopnea, edema, abdominal pain, nausea, melena, diarrhea, constipation, flank pain, dysuria, hematuria, urinary  Frequency, nocturia, numbness, tingling, seizures,  Focal weakness, Loss of consciousness,  Tremor, insomnia, depression, anxiety, and suicidal ideation.      Objective:  BP 128/72 (BP Location: Left Arm, Patient Position: Sitting, Cuff Size: Normal)   Pulse 51   Temp 97.6 F (36.4 C) (Oral)   Resp 14   Ht 5' (1.524 m)   Wt 101 lb 9.6 oz (46.1 kg)   SpO2 99%   BMI 19.84 kg/m   BP Readings from Last 3 Encounters:  03/09/20 128/72  02/27/20 122/80  02/18/20 138/65    Wt Readings from Last 3 Encounters:  03/09/20 101 lb 9.6 oz (46.1 kg)  02/27/20 106 lb (48.1 kg)  02/18/20 104 lb (  47.2 kg)    General appearance: alert, cooperative and appears stated age Ears: normal TM's and external ear canals both ears Throat: lips, mucosa, and tongue normal; teeth and gums normal Neck: no adenopathy, no carotid bruit, supple, symmetrical, trachea midline and thyroid not enlarged, symmetric, no tenderness/mass/nodules Back: symmetric, no curvature. ROM normal. No CVA tenderness. Lungs: clear to auscultation bilaterally Heart: regular rate and rhythm, S1, S2 normal, no murmur, click, rub or gallop Abdomen: soft, non-tender; bowel sounds normal; no masses,   no organomegaly Pulses: 2+ and symmetric Skin: Skin color, texture, turgor normal. No rashes or lesions Lymph nodes: Cervical, supraclavicular, and axillary nodes normal.  Lab Results  Component Value Date   HGBA1C 5.1 02/17/2020   HGBA1C 5.5 05/22/2017    Lab Results  Component Value Date   CREATININE 0.97 03/09/2020   CREATININE 0.88 02/18/2020   CREATININE 0.97 02/17/2020    Lab Results  Component Value Date   WBC 6.3 03/09/2020   HGB 13.6 03/09/2020   HCT 40.7 03/09/2020   PLT 268.0 03/09/2020   GLUCOSE 123 (H) 03/09/2020   CHOL 105 02/17/2020   TRIG 42 02/17/2020   HDL 29 (L) 02/17/2020   LDLDIRECT 91.0 10/31/2016   LDLCALC 68 02/17/2020   ALT 14 03/09/2020   AST 27 03/09/2020   NA 137 03/09/2020   K 4.5 03/09/2020   CL 102 03/09/2020   CREATININE 0.97 03/09/2020   BUN 18 03/09/2020   CO2 30 03/09/2020   TSH 2.25 03/09/2020   HGBA1C 5.1 02/17/2020       Assessment & Plan:   Problem List Items Addressed This Visit      Unprioritized   Abnormal intentional weight loss    The weight lo the ss is exaggerated by recent hospitalization with weight inflated by IV fluids.  Reassurance provided that,  based on her dietary intake and high activity level ,  She is consuming fewer calories than she is burning        Other Visit Diagnoses    Unintentional weight loss    -  Primary   Relevant Orders   TSH (Completed)   Phosphorus (Completed)   Comprehensive metabolic panel (Completed)   CBC with Differential/Platelet (Completed)      I provided  30 minutes of  face-to-face time during this encounter reviewing patient's current problems and past surgeries, labs and imaging studies, providing counseling on the above mentioned problems , and coordination  of care .  I am having Clarissia Mckeen. Canevari maintain her multivitamin, furosemide, ezetimibe, apixaban, potassium chloride, and metoprolol succinate. We will continue to administer denosumab.  No orders of the  defined types were placed in this encounter.   There are no discontinued medications.  Follow-up: No follow-ups on file.   [name], MD

## 2020-03-11 NOTE — Assessment & Plan Note (Signed)
The weight lo the ss is exaggerated by recent hospitalization with weight inflated by IV fluids.  Reassurance provided that,  based on her dietary intake and high activity level ,  She is consuming fewer calories than she is burning

## 2020-03-16 ENCOUNTER — Telehealth: Payer: Self-pay | Admitting: Internal Medicine

## 2020-03-16 NOTE — Telephone Encounter (Signed)
LMTCB

## 2020-03-16 NOTE — Telephone Encounter (Signed)
Pt would like a call back regarding her lab results

## 2020-03-16 NOTE — Telephone Encounter (Signed)
Spoke with pt in regards to her lab results. Pt gave a verbal understanding.  

## 2020-03-23 DIAGNOSIS — H35372 Puckering of macula, left eye: Secondary | ICD-10-CM | POA: Diagnosis not present

## 2020-03-23 DIAGNOSIS — H2511 Age-related nuclear cataract, right eye: Secondary | ICD-10-CM | POA: Diagnosis not present

## 2020-04-10 ENCOUNTER — Telehealth: Payer: Self-pay | Admitting: Cardiovascular Disease

## 2020-04-10 NOTE — Telephone Encounter (Signed)
Please call regarding Eliquis. States this rx is almost $50 and she will not pay this amount. Patient would like alternative

## 2020-04-10 NOTE — Telephone Encounter (Signed)
Left voicemail message to call back  

## 2020-04-11 NOTE — Telephone Encounter (Signed)
Patient returning call to discuss  ?

## 2020-04-11 NOTE — Telephone Encounter (Signed)
Left voicemail message to call back  

## 2020-04-12 NOTE — Telephone Encounter (Signed)
Patient calling in again stating she recently picked a Rx for Eliquis for 90 pills however the price was $400 opposed to $130 she used to pay. Patient did order the medication (90 pills) through [facility]. Patient cannot afford to pay this and is wondering if there is another option or financial assistance/copay card.  Patient states the price is very prohibitive for her.  Patient would like to speak with nurse

## 2020-04-13 DIAGNOSIS — H2512 Age-related nuclear cataract, left eye: Secondary | ICD-10-CM | POA: Diagnosis not present

## 2020-04-13 DIAGNOSIS — I4891 Unspecified atrial fibrillation: Secondary | ICD-10-CM | POA: Diagnosis not present

## 2020-04-24 ENCOUNTER — Other Ambulatory Visit: Payer: Self-pay

## 2020-04-24 ENCOUNTER — Encounter: Payer: Self-pay | Admitting: Ophthalmology

## 2020-04-27 ENCOUNTER — Other Ambulatory Visit
Admission: RE | Admit: 2020-04-27 | Discharge: 2020-04-27 | Disposition: A | Discharge status: Home / Self Care | Payer: Medicare Other | Source: Ambulatory Visit | Attending: Ophthalmology | Admitting: Ophthalmology

## 2020-04-27 ENCOUNTER — Other Ambulatory Visit: Payer: Self-pay

## 2020-04-27 DIAGNOSIS — Z20822 Contact with and (suspected) exposure to covid-19: Secondary | ICD-10-CM | POA: Diagnosis not present

## 2020-04-27 DIAGNOSIS — Z01812 Encounter for preprocedural laboratory examination: Secondary | ICD-10-CM | POA: Insufficient documentation

## 2020-04-27 LAB — SARS CORONAVIRUS 2 (TAT 6-24 HRS): SARS Coronavirus 2: NEGATIVE

## 2020-04-27 NOTE — Discharge Instructions (Signed)

## 2020-04-27 NOTE — Telephone Encounter (Signed)
Patient returning call.

## 2020-04-27 NOTE — Telephone Encounter (Signed)
No answer. Left message to call back.   

## 2020-04-29 ENCOUNTER — Other Ambulatory Visit: Payer: Self-pay | Admitting: Cardiovascular Disease

## 2020-04-30 NOTE — Telephone Encounter (Signed)
Pt's age 86, wt 47.2, SCr 0.97, CrCl 28.72, last ov w/ TG 01/02/20.  Reduced dosage of Eliquis is appropriate due to pt's age & wt.

## 2020-04-30 NOTE — Telephone Encounter (Signed)
Refill Request.  

## 2020-05-01 ENCOUNTER — Ambulatory Visit: Payer: Medicare Other | Admitting: Anesthesiology

## 2020-05-01 ENCOUNTER — Encounter
Admission: RE | Disposition: A | Discharge status: Home / Self Care | Payer: Self-pay | Source: Home / Self Care | Attending: Ophthalmology

## 2020-05-01 ENCOUNTER — Encounter: Payer: Self-pay | Admitting: Ophthalmology

## 2020-05-01 ENCOUNTER — Other Ambulatory Visit: Payer: Self-pay

## 2020-05-01 ENCOUNTER — Ambulatory Visit
Admission: RE | Admit: 2020-05-01 | Discharge: 2020-05-01 | Disposition: A | Discharge status: Home / Self Care | Payer: Medicare Other | Attending: Ophthalmology | Admitting: Ophthalmology

## 2020-05-01 DIAGNOSIS — I10 Essential (primary) hypertension: Secondary | ICD-10-CM | POA: Insufficient documentation

## 2020-05-01 DIAGNOSIS — H2512 Age-related nuclear cataract, left eye: Secondary | ICD-10-CM | POA: Insufficient documentation

## 2020-05-01 DIAGNOSIS — H25812 Combined forms of age-related cataract, left eye: Secondary | ICD-10-CM | POA: Diagnosis not present

## 2020-05-01 HISTORY — PX: CATARACT EXTRACTION W/PHACO: SHX586

## 2020-05-01 SURGERY — PHACOEMULSIFICATION, CATARACT, WITH IOL INSERTION
Anesthesia: Monitor Anesthesia Care | Site: Eye | Laterality: Left

## 2020-05-01 MED ORDER — ONDANSETRON HCL 4 MG/2ML IJ SOLN: 4.0000 mg | Freq: Once | INTRAMUSCULAR | Status: DC | PRN

## 2020-05-01 MED ORDER — BRIMONIDINE TARTRATE-TIMOLOL 0.2-0.5 % OP SOLN
OPHTHALMIC | Status: DC | PRN
  Administered 2020-05-01: 1 [drp] via OPHTHALMIC

## 2020-05-01 MED ORDER — ARMC OPHTHALMIC DILATING DROPS
1.0000 "application " | OPHTHALMIC | Status: DC | PRN
  Administered 2020-05-01 (×3): 1 via OPHTHALMIC

## 2020-05-01 MED ORDER — TETRACAINE HCL 0.5 % OP SOLN
1.0000 [drp] | OPHTHALMIC | Status: DC | PRN
  Administered 2020-05-01 (×3): 1 [drp] via OPHTHALMIC

## 2020-05-01 MED ORDER — FENTANYL CITRATE (PF) 100 MCG/2ML IJ SOLN
INTRAMUSCULAR | Status: DC | PRN
Start: 2020-05-01 — End: 2020-05-01
  Administered 2020-05-01: 50 ug via INTRAVENOUS

## 2020-05-01 MED ORDER — LIDOCAINE HCL (PF) 2 % IJ SOLN
INTRAOCULAR | Status: DC | PRN
  Administered 2020-05-01: 2 mL

## 2020-05-01 MED ORDER — EPINEPHRINE PF 1 MG/ML IJ SOLN
INTRAOCULAR | Status: DC | PRN
  Administered 2020-05-01: 62 mL via OPHTHALMIC

## 2020-05-01 MED ORDER — NA CHONDROIT SULF-NA HYALURON 40-17 MG/ML IO SOLN
INTRAOCULAR | Status: DC | PRN
  Administered 2020-05-01: 1 mL via INTRAOCULAR

## 2020-05-01 MED ORDER — ACETAMINOPHEN 325 MG PO TABS: 325.0000 mg | ORAL_TABLET | ORAL | Status: DC | PRN

## 2020-05-01 MED ORDER — ACETAMINOPHEN 160 MG/5ML PO SOLN: 325.0000 mg | ORAL | Status: DC | PRN

## 2020-05-01 MED ORDER — MOXIFLOXACIN HCL 0.5 % OP SOLN
OPHTHALMIC | Status: DC | PRN
  Administered 2020-05-01: 0.2 mL via OPHTHALMIC

## 2020-05-01 SURGICAL SUPPLY — 19 items
CANNULA ANT/CHMB 27G (MISCELLANEOUS) ×2 IMPLANT
CANNULA ANT/CHMB 27GA (MISCELLANEOUS) ×6 IMPLANT
GLOVE SURG LX 8.0 MICRO (GLOVE) ×2
GLOVE SURG LX STRL 8.0 MICRO (GLOVE) ×1 IMPLANT
GLOVE SURG TRIUMPH 8.0 PF LTX (GLOVE) ×3 IMPLANT
GOWN STRL REUS W/ TWL LRG LVL3 (GOWN DISPOSABLE) ×2 IMPLANT
GOWN STRL REUS W/TWL LRG LVL3 (GOWN DISPOSABLE) ×6
LENS IOL TECNIS EYHANCE 23.0 ×2 IMPLANT
MARKER SKIN DUAL TIP RULER LAB (MISCELLANEOUS) ×3 IMPLANT
NDL FILTER BLUNT 18X1 1/2 (NEEDLE) ×1 IMPLANT
NEEDLE FILTER BLUNT 18X 1/2SAF (NEEDLE) ×2
NEEDLE FILTER BLUNT 18X1 1/2 (NEEDLE) ×1 IMPLANT
PACK EYE AFTER SURG (MISCELLANEOUS) ×3 IMPLANT
PACK OPTHALMIC (MISCELLANEOUS) ×3 IMPLANT
PACK PORFILIO (MISCELLANEOUS) ×3 IMPLANT
SYR 3ML LL SCALE MARK (SYRINGE) ×3 IMPLANT
SYR TB 1ML LUER SLIP (SYRINGE) ×3 IMPLANT
WATER STERILE IRR 250ML POUR (IV SOLUTION) ×3 IMPLANT
WIPE NON LINTING 3.25X3.25 (MISCELLANEOUS) ×3 IMPLANT

## 2020-05-01 NOTE — H&P (Signed)
All labs reviewed. Abnormal studies sent to patients PCP when indicated.  Previous H&P reviewed, patient examined, there are NO CHANGES.  [address] Porfilio9/14/202111:42 AM

## 2020-05-01 NOTE — Anesthesia Preprocedure Evaluation (Signed)
Anesthesia Evaluation  Patient identified by MRN, date of birth, ID band Patient awake    Reviewed: Allergy & Precautions, NPO status   Airway Mallampati: II  TM Distance: >3 FB     Dental   Pulmonary    breath sounds clear to auscultation       Cardiovascular hypertension, + dysrhythmias (PAF) + Valvular Problems/Murmurs (mod-severe MR)  Rhythm:Regular Rate:Normal  HLD  TTE 02/17/2020 1. Thicken of anterioe MV leafet Vegation can not be excluded. Probable  just calcium build up.  2. Left ventricular ejection fraction, by estimation, is 60 to 65%. The  left ventricle has normal function. The left ventricle has no regional  wall motion abnormalities. There is mild left ventricular hypertrophy.  Left ventricular diastolic parameters  are consistent with Grade I diastolic dysfunction (impaired relaxation).  3. Right ventricular systolic function is normal. The right ventricular  size is normal.  4. Left atrial size was moderately dilated.  5. The mitral valve is myxomatous. Prolapse of the anterior leaflet  (posterior leaflet not well visualized) Moderate mitral valve  regurgitation. No evidence of mitral stenosis.  6. The aortic valve is grossly normal. Aortic valve regurgitation is not  visualized. Mild aortic valve stenosis.    Neuro/Psych    GI/Hepatic   Endo/Other    Renal/GU      Musculoskeletal   Abdominal   Peds  Hematology   Anesthesia Other Findings   Reproductive/Obstetrics                             Anesthesia Physical  Anesthesia Plan  ASA: III  Anesthesia Plan: MAC   Post-op Pain Management:    Induction: Intravenous  PONV Risk Score and Plan: TIVA, Midazolam and Treatment may vary due to age or medical condition  Airway Management Planned: Natural Airway and Nasal Cannula  Additional Equipment:   Intra-op Plan:   Post-operative Plan:   Informed  Consent: I have reviewed the patients History and Physical, chart, labs and discussed the procedure including the risks, benefits and alternatives for the proposed anesthesia with the patient or authorized representative who has indicated his/her understanding and acceptance.       Plan Discussed with: CRNA  Anesthesia Plan Comments:         Anesthesia Quick Evaluation  

## 2020-05-01 NOTE — Transfer of Care (Signed)
Immediate Anesthesia Transfer of Care Note  Patient: Brandi [name] [facility]  Procedure(s) Performed: CATARACT EXTRACTION PHACO AND INTRAOCULAR LENS PLACEMENT (IOC) LEFT 6.81 00:48.8 (Left Eye)  Patient Location: PACU  Anesthesia Type: MAC  Level of Consciousness: awake, alert  and patient cooperative  Airway and Oxygen Therapy: Patient Spontanous Breathing and Patient connected to supplemental oxygen  Post-op Assessment: Post-op Vital signs reviewed, Patient's Cardiovascular Status Stable, Respiratory Function Stable, Patent Airway and No signs of Nausea or vomiting  Post-op Vital Signs: Reviewed and stable  Complications: No complications documented.

## 2020-05-01 NOTE — Anesthesia Procedure Notes (Signed)
Procedure Name: MAC Date/Time: 05/01/2020 11:56 AM Performed by: Jeannene Patella, CRNA Pre-anesthesia Checklist: Patient identified, Emergency Drugs available, Suction available, Timeout performed and Patient being monitored Patient Re-evaluated:Patient Re-evaluated prior to induction Oxygen Delivery Method: Nasal cannula Placement Confirmation: positive ETCO2

## 2020-05-01 NOTE — Anesthesia Postprocedure Evaluation (Signed)
Anesthesia Post Note  Patient: Brandi [name] [facility]  Procedure(s) Performed: CATARACT EXTRACTION PHACO AND INTRAOCULAR LENS PLACEMENT (IOC) LEFT 6.81 00:48.8 (Left Eye)     Patient location during evaluation: PACU Anesthesia Type: MAC Level of consciousness: awake Pain management: pain level controlled Vital Signs Assessment: post-procedure vital signs reviewed and stable Respiratory status: respiratory function stable Cardiovascular status: stable Postop Assessment: no apparent nausea or vomiting Anesthetic complications: no   No complications documented.  Veda Canning

## 2020-05-01 NOTE — Op Note (Signed)
PREOPERATIVE DIAGNOSIS:  Nuclear sclerotic cataract of the left eye.   POSTOPERATIVE DIAGNOSIS:  Nuclear sclerotic cataract of the left eye.   OPERATIVE PROCEDURE:@   SURGEON:  Galen Manila, MD.   ANESTHESIA:  Anesthesiologist: Jola Babinski, MD CRNA: Jinny Blossom, CRNA  1.      Managed anesthesia care. 2.     0.65ml of Shugarcaine was instilled following the paracentesis   COMPLICATIONS:  None.   TECHNIQUE:   Stop and chop   DESCRIPTION OF PROCEDURE:  The patient was examined and consented in the preoperative holding area where the aforementioned topical anesthesia was applied to the left eye and then brought back to the Operating Room where the left eye was prepped and draped in the usual sterile ophthalmic fashion and a lid speculum was placed. A paracentesis was created with the side port blade and the anterior chamber was filled with viscoelastic. A near clear corneal incision was performed with the steel keratome. A continuous curvilinear capsulorrhexis was performed with a cystotome followed by the capsulorrhexis forceps. Hydrodissection and hydrodelineation were carried out with BSS on a blunt cannula. The lens was removed in a stop and chop  technique and the remaining cortical material was removed with the irrigation-aspiration handpiece. The capsular bag was inflated with viscoelastic and the Technis ZCB00 lens was placed in the capsular bag without complication. The remaining viscoelastic was removed from the eye with the irrigation-aspiration handpiece. The wounds were hydrated. The anterior chamber was flushed with BSS and the eye was inflated to physiologic pressure. 0.104ml Vigamox was placed in the anterior chamber. The wounds were found to be water tight. The eye was dressed with Combigan. The patient was given protective glasses to wear throughout the day and a shield with which to sleep tonight. The patient was also given drops with which to begin a drop regimen today  and will follow-up with me in one day. Implant Name Type Inv. Item Serial No. Manufacturer Lot No. LRB No. Used Action  LENS II EYHANCE 23.0 - [id number]  LENS II EYHANCE 23.0 [id number] JOHNSON   Left 1 Implanted    Procedure(s): CATARACT EXTRACTION PHACO AND INTRAOCULAR LENS PLACEMENT (IOC) LEFT 6.81 00:48.8 (Left)  Electronically signed: Galen Manila 05/01/2020 12:08 PM

## 2020-05-02 ENCOUNTER — Encounter: Payer: Self-pay | Admitting: Ophthalmology

## 2020-05-02 NOTE — Telephone Encounter (Signed)
Attempted to call the patient. No answer- I left a message to please call back.  

## 2020-05-08 ENCOUNTER — Other Ambulatory Visit: Payer: Self-pay

## 2020-05-08 ENCOUNTER — Ambulatory Visit (INDEPENDENT_AMBULATORY_CARE_PROVIDER_SITE_OTHER): Payer: Medicare Other

## 2020-05-08 DIAGNOSIS — Z23 Encounter for immunization: Secondary | ICD-10-CM

## 2020-05-08 NOTE — Telephone Encounter (Signed)
Please submit patient assistance paperwork for patient.   [email or web address]

## 2020-05-08 NOTE — Telephone Encounter (Signed)
She can apply for BMS patient assistance. She will have had to spend 3% on her annual household income on out-of pocket prescription expenses in order to qualify.   [email or web address]  I called and spoke with patient who states that she really doesn't need patient assistance. States she can afford it, she just hates paying money for a drug she really doesn't want to take." But the Doctor thinks she needs it. Thinks she is going to have a stroke. " Reviewed the risk of stroke with patient. She states she will pay the money for the Eliquis.

## 2020-05-08 NOTE — Telephone Encounter (Signed)
Spoke with patient and she states that monthly cost would be $133.25 and for 3 months it would be $398.74. Advised that she needs to remain on medication to reduce her risk of stroke. She verbalized understanding and advised that I would see if our pharmacy team or social worker team would be able to assist with any possible resources. She verbalized understanding with no further questions at this time.

## 2020-05-08 NOTE — Telephone Encounter (Signed)
Spoke with patient and reviewed cost of her medication and she states her pharmacy is mail order. Advised to call her [facility] mail order to see what the cost would be for one month or three month supply. Discussed that whichever she can afford then to please check with them and let me know which is needed to be sent in. She verbalized understanding with no further questions at this time.

## 2020-05-15 ENCOUNTER — Encounter: Payer: Self-pay | Admitting: Ophthalmology

## 2020-05-15 ENCOUNTER — Other Ambulatory Visit: Payer: Self-pay

## 2020-05-15 DIAGNOSIS — I4891 Unspecified atrial fibrillation: Secondary | ICD-10-CM | POA: Diagnosis not present

## 2020-05-15 DIAGNOSIS — H2511 Age-related nuclear cataract, right eye: Secondary | ICD-10-CM | POA: Diagnosis not present

## 2020-05-18 ENCOUNTER — Other Ambulatory Visit
Admission: RE | Admit: 2020-05-18 | Discharge: 2020-05-18 | Disposition: A | Discharge status: Home / Self Care | Payer: Medicare Other | Source: Ambulatory Visit | Attending: Ophthalmology | Admitting: Ophthalmology

## 2020-05-18 ENCOUNTER — Other Ambulatory Visit: Payer: Self-pay

## 2020-05-18 DIAGNOSIS — Z20822 Contact with and (suspected) exposure to covid-19: Secondary | ICD-10-CM | POA: Insufficient documentation

## 2020-05-18 DIAGNOSIS — Z01812 Encounter for preprocedural laboratory examination: Secondary | ICD-10-CM | POA: Insufficient documentation

## 2020-05-18 NOTE — Discharge Instructions (Signed)

## 2020-05-19 LAB — SARS CORONAVIRUS 2 (TAT 6-24 HRS): SARS Coronavirus 2: NEGATIVE

## 2020-05-22 ENCOUNTER — Other Ambulatory Visit: Payer: Self-pay

## 2020-05-22 ENCOUNTER — Encounter
Admission: RE | Disposition: A | Discharge status: Home / Self Care | Payer: Self-pay | Source: Home / Self Care | Attending: Ophthalmology

## 2020-05-22 ENCOUNTER — Encounter: Payer: Self-pay | Admitting: Ophthalmology

## 2020-05-22 ENCOUNTER — Ambulatory Visit: Payer: Medicare Other | Admitting: Anesthesiology

## 2020-05-22 ENCOUNTER — Ambulatory Visit
Admission: RE | Admit: 2020-05-22 | Discharge: 2020-05-22 | Disposition: A | Discharge status: Home / Self Care | Payer: Medicare Other | Attending: Ophthalmology | Admitting: Ophthalmology

## 2020-05-22 DIAGNOSIS — E785 Hyperlipidemia, unspecified: Secondary | ICD-10-CM | POA: Insufficient documentation

## 2020-05-22 DIAGNOSIS — I480 Paroxysmal atrial fibrillation: Secondary | ICD-10-CM | POA: Diagnosis not present

## 2020-05-22 DIAGNOSIS — Z7901 Long term (current) use of anticoagulants: Secondary | ICD-10-CM | POA: Diagnosis not present

## 2020-05-22 DIAGNOSIS — Z882 Allergy status to sulfonamides status: Secondary | ICD-10-CM | POA: Diagnosis not present

## 2020-05-22 DIAGNOSIS — I10 Essential (primary) hypertension: Secondary | ICD-10-CM | POA: Diagnosis not present

## 2020-05-22 DIAGNOSIS — H25811 Combined forms of age-related cataract, right eye: Secondary | ICD-10-CM | POA: Diagnosis not present

## 2020-05-22 DIAGNOSIS — I2721 Secondary pulmonary arterial hypertension: Secondary | ICD-10-CM | POA: Diagnosis not present

## 2020-05-22 DIAGNOSIS — Z79899 Other long term (current) drug therapy: Secondary | ICD-10-CM | POA: Insufficient documentation

## 2020-05-22 DIAGNOSIS — Z888 Allergy status to other drugs, medicaments and biological substances status: Secondary | ICD-10-CM | POA: Insufficient documentation

## 2020-05-22 DIAGNOSIS — Z8249 Family history of ischemic heart disease and other diseases of the circulatory system: Secondary | ICD-10-CM | POA: Diagnosis not present

## 2020-05-22 DIAGNOSIS — I341 Nonrheumatic mitral (valve) prolapse: Secondary | ICD-10-CM | POA: Insufficient documentation

## 2020-05-22 DIAGNOSIS — H2511 Age-related nuclear cataract, right eye: Secondary | ICD-10-CM | POA: Insufficient documentation

## 2020-05-22 DIAGNOSIS — Z885 Allergy status to narcotic agent status: Secondary | ICD-10-CM | POA: Diagnosis not present

## 2020-05-22 HISTORY — PX: CATARACT EXTRACTION W/PHACO: SHX586

## 2020-05-22 SURGERY — PHACOEMULSIFICATION, CATARACT, WITH IOL INSERTION
Anesthesia: Monitor Anesthesia Care | Site: Eye | Laterality: Right

## 2020-05-22 MED ORDER — ACETAMINOPHEN 325 MG PO TABS: 325.0000 mg | ORAL_TABLET | ORAL | Status: DC | PRN

## 2020-05-22 MED ORDER — ARMC OPHTHALMIC DILATING DROPS
1.0000 "application " | OPHTHALMIC | Status: DC | PRN
  Administered 2020-05-22 (×3): 1 via OPHTHALMIC

## 2020-05-22 MED ORDER — EPINEPHRINE PF 1 MG/ML IJ SOLN
INTRAOCULAR | Status: DC | PRN
  Administered 2020-05-22: 43 mL via OPHTHALMIC

## 2020-05-22 MED ORDER — ONDANSETRON HCL 4 MG/2ML IJ SOLN: 4.0000 mg | Freq: Once | INTRAMUSCULAR | Status: DC | PRN

## 2020-05-22 MED ORDER — TETRACAINE HCL 0.5 % OP SOLN
1.0000 [drp] | OPHTHALMIC | Status: DC | PRN
  Administered 2020-05-22 (×3): 1 [drp] via OPHTHALMIC

## 2020-05-22 MED ORDER — BRIMONIDINE TARTRATE-TIMOLOL 0.2-0.5 % OP SOLN
OPHTHALMIC | Status: DC | PRN
  Administered 2020-05-22: 1 [drp] via OPHTHALMIC

## 2020-05-22 MED ORDER — NA CHONDROIT SULF-NA HYALURON 40-17 MG/ML IO SOLN
INTRAOCULAR | Status: DC | PRN
  Administered 2020-05-22: 1 mL via INTRAOCULAR

## 2020-05-22 MED ORDER — MOXIFLOXACIN HCL 0.5 % OP SOLN
OPHTHALMIC | Status: DC | PRN
  Administered 2020-05-22: 0.2 mL via OPHTHALMIC

## 2020-05-22 MED ORDER — LIDOCAINE HCL (PF) 2 % IJ SOLN
INTRAOCULAR | Status: DC | PRN
  Administered 2020-05-22: 2 mL

## 2020-05-22 MED ORDER — ACETAMINOPHEN 160 MG/5ML PO SOLN: 325.0000 mg | ORAL | Status: DC | PRN

## 2020-05-22 MED ORDER — FENTANYL CITRATE (PF) 100 MCG/2ML IJ SOLN
INTRAMUSCULAR | Status: DC | PRN
Start: 2020-05-22 — End: 2020-05-22
  Administered 2020-05-22: 50 ug via INTRAVENOUS

## 2020-05-22 SURGICAL SUPPLY — 19 items
CANNULA ANT/CHMB 27G (MISCELLANEOUS) ×2 IMPLANT
CANNULA ANT/CHMB 27GA (MISCELLANEOUS) ×4 IMPLANT
GLOVE SURG LX 8.0 MICRO (GLOVE) ×1
GLOVE SURG LX STRL 8.0 MICRO (GLOVE) ×1 IMPLANT
GLOVE SURG TRIUMPH 8.0 PF LTX (GLOVE) ×2 IMPLANT
GOWN STRL REUS W/ TWL LRG LVL3 (GOWN DISPOSABLE) ×2 IMPLANT
GOWN STRL REUS W/TWL LRG LVL3 (GOWN DISPOSABLE) ×4
LENS IOL TECNIS EYHANCE 23.5 (Intraocular Lens) ×1 IMPLANT
MARKER SKIN DUAL TIP RULER LAB (MISCELLANEOUS) ×2 IMPLANT
NDL FILTER BLUNT 18X1 1/2 (NEEDLE) ×1 IMPLANT
NEEDLE FILTER BLUNT 18X 1/2SAF (NEEDLE) ×1
NEEDLE FILTER BLUNT 18X1 1/2 (NEEDLE) ×1 IMPLANT
PACK EYE AFTER SURG (MISCELLANEOUS) ×2 IMPLANT
PACK OPTHALMIC (MISCELLANEOUS) ×2 IMPLANT
PACK PORFILIO (MISCELLANEOUS) ×2 IMPLANT
SYR 3ML LL SCALE MARK (SYRINGE) ×2 IMPLANT
SYR TB 1ML LUER SLIP (SYRINGE) ×2 IMPLANT
WATER STERILE IRR 250ML POUR (IV SOLUTION) ×2 IMPLANT
WIPE NON LINTING 3.25X3.25 (MISCELLANEOUS) ×2 IMPLANT

## 2020-05-22 NOTE — Transfer of Care (Signed)
Immediate Anesthesia Transfer of Care Note  Patient: Brandi [name] [facility]  Procedure(s) Performed: CATARACT EXTRACTION PHACO AND INTRAOCULAR LENS PLACEMENT (IOC) RIGHT 7.41 00:43.3 (Right Eye)  Patient Location: PACU  Anesthesia Type: MAC  Level of Consciousness: awake, alert  and patient cooperative  Airway and Oxygen Therapy: Patient Spontanous Breathing and Patient connected to supplemental oxygen  Post-op Assessment: Post-op Vital signs reviewed, Patient's Cardiovascular Status Stable, Respiratory Function Stable, Patent Airway and No signs of Nausea or vomiting  Post-op Vital Signs: Reviewed and stable  Complications: No complications documented.

## 2020-05-22 NOTE — Op Note (Signed)
PREOPERATIVE DIAGNOSIS:  Nuclear sclerotic cataract of the right eye.   POSTOPERATIVE DIAGNOSIS:  Cataract   OPERATIVE PROCEDURE:@   SURGEON:  Galen Manila, MD.   ANESTHESIA:  Anesthesiologist: Jola Babinski, MD CRNA: Maree Krabbe, CRNA  1.      Managed anesthesia care. 2.      0.70ml of Shugarcaine was instilled in the eye following the paracentesis.   COMPLICATIONS:  None.   TECHNIQUE:   Stop and chop   DESCRIPTION OF PROCEDURE:  The patient was examined and consented in the preoperative holding area where the aforementioned topical anesthesia was applied to the right eye and then brought back to the Operating Room where the right eye was prepped and draped in the usual sterile ophthalmic fashion and a lid speculum was placed. A paracentesis was created with the side port blade and the anterior chamber was filled with viscoelastic. A near clear corneal incision was performed with the steel keratome. A continuous curvilinear capsulorrhexis was performed with a cystotome followed by the capsulorrhexis forceps. Hydrodissection and hydrodelineation were carried out with BSS on a blunt cannula. The lens was removed in a stop and chop  technique and the remaining cortical material was removed with the irrigation-aspiration handpiece. The capsular bag was inflated with viscoelastic and the Technis ZCB00  lens was placed in the capsular bag without complication. The remaining viscoelastic was removed from the eye with the irrigation-aspiration handpiece. The wounds were hydrated. The anterior chamber was flushed with BSS and the eye was inflated to physiologic pressure. 0.19ml of Vigamox was placed in the anterior chamber. The wounds were found to be water tight. The eye was dressed with Combigan. The patient was given protective glasses to wear throughout the day and a shield with which to sleep tonight. The patient was also given drops with which to begin a drop regimen today and will follow-up  with me in one day. Implant Name Type Inv. Item Serial No. Manufacturer Lot No. LRB No. Used Action  LENS II EYHANCE 23.5 - [id number] Intraocular Lens LENS II EYHANCE 23.5 [id number] JOHNSON   Right 1 Implanted   Procedure(s): CATARACT EXTRACTION PHACO AND INTRAOCULAR LENS PLACEMENT (IOC) RIGHT 7.41 00:43.3 (Right)  Electronically signed: Galen Manila 05/22/2020 12:14 PM

## 2020-05-22 NOTE — Anesthesia Postprocedure Evaluation (Signed)
Anesthesia Post Note  Patient: Brandi [name] [facility]  Procedure(s) Performed: CATARACT EXTRACTION PHACO AND INTRAOCULAR LENS PLACEMENT (IOC) RIGHT 7.41 00:43.3 (Right Eye)     Patient location during evaluation: PACU Anesthesia Type: MAC Level of consciousness: awake Pain management: pain level controlled Vital Signs Assessment: post-procedure vital signs reviewed and stable Respiratory status: respiratory function stable Cardiovascular status: stable Postop Assessment: no apparent nausea or vomiting Anesthetic complications: no   No complications documented.  Veda Canning

## 2020-05-22 NOTE — H&P (Signed)
[facility]   Primary Care Physician:  [name], MD Ophthalmologist: Dr. Druscilla Brownie  Pre-Procedure History & Physical: HPI:  Brandi [name] is a 86 y.o. female here for cataract surgery.   Past Medical History:  Diagnosis Date  . Essential hypertension   . Hyperlipidemia   . Mitral valve prolapse   . Moderate to Severe Mitral regurgitation    a. 07/2018 Echo: EF 65-70%. Mild AS. Mild MVP w/ mild MS and mod-sev MR. Sev dil LA. Midly dil RA. PASP 40-70mmHg.  [name] PAF (paroxysmal atrial fibrillation) (HCC)    a. CHA2DS2VASc = 4-->Eliquis 2.5mg  BID; b. 08/2019 Event Monitor: 100% Afib (52-199, avg 96). 4 runs of VT vs afib w/ aberrancy.  [name] PAH (pulmonary artery hypertension) (HCC)    a. 07/2018 Echo: PASP 40-42mmHg in setting of mild MS and mod-sev MR.    Past Surgical History:  Procedure Laterality Date  . BIOPSY BREAST  1980   left ,  benign  . BREAST BIOPSY  1980   normal  . CATARACT EXTRACTION W/PHACO Left 05/01/2020   Procedure: CATARACT EXTRACTION PHACO AND INTRAOCULAR LENS PLACEMENT (IOC) LEFT 6.81 00:48.8;  Surgeon: Galen Manila, MD;  Location: [facility];  Service: Ophthalmology;  Laterality: Left;  . Hospitalized for endocarditis  1987    Prior to Admission medications   Medication Sig Start Date End Date Taking? Authorizing Provider  Calcium Carbonate-Vit D-Min (CALCIUM 1200 PO) Take 600 mg by mouth.   Yes [provider]  ELIQUIS 2.5 MG TABS tablet TAKE 1 TABLET BY MOUTH  TWICE DAILY 04/30/20  Yes Gollan, Tollie Pizza, MD  ezetimibe (ZETIA) 10 MG tablet TAKE 1 TABLET BY MOUTH  DAILY Patient taking differently: Take 10 mg by mouth daily.  08/01/19  Yes [name], MD  furosemide (LASIX) 20 MG tablet TAKE 1 TABLET BY MOUTH  DAILY AS NEEDED FOR FLUID  RETENTION Patient taking differently: Take 20 mg by mouth daily.  10/04/18  Yes Gollan, Tollie Pizza, MD  metoprolol succinate (TOPROL-XL) 50 MG 24 hr tablet TAKE 1 TABLET BY MOUTH  DAILY WITH OR  IMMEDIATELY  FOLLOWING A MEAL Patient taking differently: Take 50 mg by mouth daily.  01/03/20  Yes Antonieta Iba, MD  Multiple Vitamin (MULTIVITAMIN) tablet Take 1 tablet by mouth daily.     Yes [provider]  potassium chloride (KLOR-CON) 10 MEQ tablet Take 1 tablet (10 mEq total) by mouth daily as needed. 01/02/20  Yes Antonieta Iba, MD    Allergies as of 05/03/2020 - Review Complete 05/01/2020  Allergen Reaction Noted  . Codeine    . Statins  10/12/2008  . Sulfonamide derivatives      Family History  Problem Relation Age of Onset  . Heart attack Father   . Coronary artery disease Father   . Cancer Sister        esophageal  . Heart disease Brother     Social History   Socioeconomic History  . Marital status: Widowed    Spouse name: Not on file  . Number of children: 0  . Years of education: Not on file  . Highest education level: Not on file  Occupational History  . Occupation: retired  Tobacco Use  . Smoking status: Never Smoker  . Smokeless tobacco: Never Used  Vaping Use  . Vaping Use: Never used  Substance and Sexual Activity  . Alcohol use: No  . Drug use: No  . Sexual activity: Not Currently  Other Topics Concern  . Not on file  Social History Narrative   Lives at [facility]   Exercise, walks 7 days a week with aerobic exercise 2 days a week      Social Determinants of Health   Financial Resource Strain: Low Risk   . Difficulty of Paying Living Expenses: Not hard at all  Food Insecurity: No Food Insecurity  . Worried About Programme researcher, broadcasting/film/video in the Last Year: Never true  . Ran Out of Food in the Last Year: Never true  Transportation Needs: No Transportation Needs  . Lack of Transportation (Medical): No  . Lack of Transportation (Non-Medical): No  Physical Activity: Sufficiently Active  . Days of Exercise per Week: 7 days  . Minutes of Exercise per Session: 30 min  Stress: No Stress Concern Present  . Feeling of Stress : Not at  all  Social Connections: Unknown  . Frequency of Communication with Friends and Family: More than three times a week  . Frequency of Social Gatherings with Friends and Family: Once a week  . Attends Religious Services: Not on file  . Active Member of Clubs or Organizations: Yes  . Attends Banker Meetings: Not on file  . Marital Status: Widowed  Intimate Partner Violence: Not At Risk  . Fear of Current or Ex-Partner: No  . Emotionally Abused: No  . Physically Abused: No  . Sexually Abused: No    Review of Systems: See HPI, otherwise negative ROS  Physical Exam: BP (!) 165/58   Pulse (!) 52   Temp 97.6 F (36.4 C) (Temporal)   Ht 5' (1.524 m)   Wt 47.2 kg   SpO2 97%   BMI 20.31 kg/m  General:   Alert,  pleasant and cooperative in NAD Head:  Normocephalic and atraumatic. Lungs:  Clear to auscultation.    Heart:  Regular rate and rhythm.   Impression/Plan: Brandi [name] is here for cataract surgery.  Risks, benefits, limitations, and alternatives regarding cataract surgery have been reviewed with the patient.  Questions have been answered.  All parties agreeable.   Galen Manila, MD  05/22/2020, 11:49 AM

## 2020-05-22 NOTE — Anesthesia Procedure Notes (Signed)
Procedure Name: MAC Date/Time: 05/22/2020 11:57 AM Performed by: Cameron Ali, CRNA Pre-anesthesia Checklist: Patient identified, Emergency Drugs available, Suction available, Timeout performed and Patient being monitored Patient Re-evaluated:Patient Re-evaluated prior to induction Oxygen Delivery Method: Nasal cannula Placement Confirmation: positive ETCO2

## 2020-05-22 NOTE — Anesthesia Preprocedure Evaluation (Signed)
Anesthesia Evaluation  Patient identified by MRN, date of birth, ID band Patient awake    Reviewed: Allergy & Precautions, NPO status   Airway Mallampati: II  TM Distance: >3 FB     Dental   Pulmonary    breath sounds clear to auscultation       Cardiovascular hypertension, + dysrhythmias (PAF) + Valvular Problems/Murmurs (mod-severe MR)  Rhythm:Regular Rate:Normal  HLD  TTE 02/17/2020 1. Thicken of anterioe MV leafet Vegation can not be excluded. Probable  just calcium build up.  2. Left ventricular ejection fraction, by estimation, is 60 to 65%. The  left ventricle has normal function. The left ventricle has no regional  wall motion abnormalities. There is mild left ventricular hypertrophy.  Left ventricular diastolic parameters  are consistent with Grade I diastolic dysfunction (impaired relaxation).  3. Right ventricular systolic function is normal. The right ventricular  size is normal.  4. Left atrial size was moderately dilated.  5. The mitral valve is myxomatous. Prolapse of the anterior leaflet  (posterior leaflet not well visualized) Moderate mitral valve  regurgitation. No evidence of mitral stenosis.  6. The aortic valve is grossly normal. Aortic valve regurgitation is not  visualized. Mild aortic valve stenosis.    Neuro/Psych    GI/Hepatic   Endo/Other    Renal/GU      Musculoskeletal   Abdominal   Peds  Hematology   Anesthesia Other Findings   Reproductive/Obstetrics                             Anesthesia Physical  Anesthesia Plan  ASA: III  Anesthesia Plan: MAC   Post-op Pain Management:    Induction: Intravenous  PONV Risk Score and Plan: TIVA, Midazolam and Treatment may vary due to age or medical condition  Airway Management Planned: Natural Airway and Nasal Cannula  Additional Equipment:   Intra-op Plan:   Post-operative Plan:   Informed  Consent: I have reviewed the patients History and Physical, chart, labs and discussed the procedure including the risks, benefits and alternatives for the proposed anesthesia with the patient or authorized representative who has indicated his/her understanding and acceptance.       Plan Discussed with: CRNA  Anesthesia Plan Comments:         Anesthesia Quick Evaluation

## 2020-05-23 ENCOUNTER — Encounter: Payer: Self-pay | Admitting: Ophthalmology

## 2020-07-02 NOTE — Progress Notes (Signed)
Cardiology Office Note  Date:  07/04/2020   ID:  Brandi [name], DOB [birth year], MRN [id number]  PCP:  [name], MD   Chief Complaint  Patient presents with   Follow-up    6 month/ Meds reviewed by the pt. verbally. "doing well."     HPI:  86 year old woman with  anterior leaflet MVP and moderate to severe MR, in 2018 Mild to moderate  aortic valve stenosis Moderate to severe TR, RVSP 54 in 2018 Hyperlipidemia Lives at [facility] who presents for routine followup of her mitral and aortic valve disease, atrial fibrillation   Lives at [facility] by price of eliquis Very rare tachycardia palpitation  Does not take lasix/potassium on a regular basis, very sparingly for ankle swelling  Walks 1 mile daily Indoor gym/track at [facility]  EKG in NSR today event monitor 09/2019 was 100% atrial fib  Sleeping well  Echo 02/2020 reviewed with her EF 60% Moderate MR, mild TR Mild AS  March 2021  back in normal sinus rhythm Tolerating Eliquis low-dose with metoprolol, no complaints  EKG personally reviewed by myself on todays visit NSR with rate 61 bpm, nonspecific T wave ABN III and AVF  Event monitor, February 2021  discussed in detail Rhythm is atrial fibrillation/flutter, 100% burden ranging from 52-199 bpm (avg of 96 bpm). 4 Ventricular Tachycardia (unable to exclude atrial fibrillation with aberrancy) runs occurred, the run with the fastest interval lasting 9 beats with a max rate of 193 bpm, the longest lasting 10 beats with an avg rate of 163 bpm.  No patient triggered Isolated VEs were occasional (1.8%, 32223), VE Couplets were rare (<1.0%, 58), and VE Triplets were rare (<1.0%, 2). Ventricular Bigeminy and Trigeminy were present. Patient triggered events were not associated with significant arrhythmia  Echo 07/2018 Left ventricle: The cavity size was normal. Wall thickness was   increased in a pattern of mild LVH. Systolic function was   vigorous.  The estimated ejection fraction was in the range of 65%   to 70%.  - Aortic valve:  mild stenosis. - Mitral valve:  Mild prolapse. mild stenosis.  moderate to   severe regurgitation directed posteriorly. - Left atrium: The atrium was severely dilated. - Pulmonary arteries: Systolic pressure was moderately increased,   in the range of 40 mm Hg to 45 mm Hg plus central venous/right   atrial pressure.   PMH:   has a past medical history of Essential hypertension, Hyperlipidemia, Mitral valve prolapse, Moderate to Severe Mitral regurgitation, PAF (paroxysmal atrial fibrillation) (HCC), and PAH (pulmonary artery hypertension) (HCC).  PSH:    Past Surgical History:  Procedure Laterality Date   BIOPSY BREAST  1980   left ,  benign   BREAST BIOPSY  1980   normal   CATARACT EXTRACTION W/PHACO Left 05/01/2020   Procedure: CATARACT EXTRACTION PHACO AND INTRAOCULAR LENS PLACEMENT (IOC) LEFT 6.81 00:48.8;  Surgeon: Galen Manila, MD;  Location: [facility];  Service: Ophthalmology;  Laterality: Left;   CATARACT EXTRACTION W/PHACO Right 05/22/2020   Procedure: CATARACT EXTRACTION PHACO AND INTRAOCULAR LENS PLACEMENT (IOC) RIGHT 7.41 00:43.3;  Surgeon: Galen Manila, MD;  Location: [facility];  Service: Ophthalmology;  Laterality: Right;   Hospitalized for endocarditis  1987    Current Outpatient Medications  Medication Sig Dispense Refill   CALCIUM 600 1500 (600 Ca) MG TABS tablet Take by mouth.     Calcium Carbonate-Vit D-Min (CALCIUM 1200 PO) Take 600 mg by mouth.  ELIQUIS 2.5 MG TABS tablet TAKE 1 TABLET BY MOUTH  TWICE DAILY 60 tablet 6   ezetimibe (ZETIA) 10 MG tablet TAKE 1 TABLET BY MOUTH  DAILY (Patient taking differently: Take 10 mg by mouth daily. ) 90 tablet 3   furosemide (LASIX) 20 MG tablet TAKE 1 TABLET BY MOUTH  DAILY AS NEEDED FOR FLUID  RETENTION (Patient taking differently: Take 20 mg by mouth daily. ) 90 tablet 2   metoprolol succinate  (TOPROL-XL) 50 MG 24 hr tablet TAKE 1 TABLET BY MOUTH  DAILY WITH OR IMMEDIATELY  FOLLOWING A MEAL (Patient taking differently: Take 50 mg by mouth daily. ) 90 tablet 1   Multiple Vitamin (MULTIVITAMIN) tablet Take 1 tablet by mouth daily.       potassium chloride (KLOR-CON) 10 MEQ tablet Take 1 tablet (10 mEq total) by mouth daily as needed. 30 tablet 1   Current Facility-Administered Medications  Medication Dose Route Frequency Provider Last Rate Last Admin   denosumab (PROLIA) injection 60 mg  60 mg Subcutaneous Q6 months [name], MD   60 mg at 02/27/20 1106     Allergies:   Codeine, Statins, and Sulfonamide derivatives   Social History:  The patient  reports that she has never smoked. She has never used smokeless tobacco. She reports that she does not drink alcohol and does not use drugs.   Family History:   family history includes Cancer in her sister; Coronary artery disease in her father; Heart attack in her father; Heart disease in her brother.    Review of Systems: Review of Systems  Constitutional: Negative.   HENT: Negative.   Respiratory: Negative.   Cardiovascular: Negative.   Gastrointestinal: Negative.   Musculoskeletal: Negative.   Neurological: Negative.   Psychiatric/Behavioral: Negative.   All other systems reviewed and are negative.   PHYSICAL EXAM: VS:  BP 140/60 (BP Location: Left Arm, Patient Position: Sitting, Cuff Size: Normal)    Pulse 61    Ht 5' (1.524 m)    Wt 101 lb 6 oz (46 kg)    SpO2 98%    BMI 19.80 kg/m  , BMI Body mass index is 19.8 kg/m. Constitutional:  oriented to person, place, and time. No distress.  HENT:  Head: Grossly normal Eyes:  no discharge. No scleral icterus.  Neck: No JVD, no carotid bruits  Cardiovascular: Regular rate and rhythm, 1-2 SEM LSB to axilla Pulmonary/Chest: Clear to auscultation bilaterally, no wheezes or rails Abdominal: Soft.  no distension.  no tenderness.  Musculoskeletal: Normal range of  motion Neurological:  normal muscle tone. Coordination normal. No atrophy Skin: Skin warm and dry Psychiatric: normal affect, pleasant   Recent Labs: 02/16/2020: B Natriuretic Peptide 988.6 02/18/2020: Magnesium 2.0 03/09/2020: ALT 14; BUN 18; Creatinine, Ser 0.97; Hemoglobin 13.6; Platelets 268.0; Potassium 4.5; Sodium 137; TSH 2.25    Lipid Panel Lab Results  Component Value Date   CHOL 105 02/17/2020   HDL 29 (L) 02/17/2020   LDLCALC 68 02/17/2020   TRIG 42 02/17/2020      Wt Readings from Last 3 Encounters:  07/04/20 101 lb 6 oz (46 kg)  05/22/20 104 lb (47.2 kg)  05/01/20 104 lb (47.2 kg)     ASSESSMENT AND PLAN:  Atrial flutter on Eliquis, metoprolol succinate 50 daily, Arrhythmia jan and feb 2021 NSR today, feels well, no swelling  Mixed hyperlipidemia Tolerating Zetia Stable, numbers great  Chronic venous insufficiency  recommend compression hose No edema today  MITRAL REGURGITATION No  SOB Mitral valve prolapse, known Moderate to severe MR, she is aware, Does not want surgery, discussed mitral clip, she prefers to hold off at this time  Aortic valve stenosis Mild aortic valve stenosis on recent echocardiogram stable  Pulmonary HTN Denies SOB Lasix as needed for swelling   Total encounter time more than 25 minutes  Greater than 50% was spent in counseling and coordination of care with the patient    Orders Placed This Encounter  Procedures   EKG 12-Lead     Signed, Dossie Arbour, M.D., Ph.D. 07/04/2020  [facility] [place], [place] [phone]

## 2020-07-03 DIAGNOSIS — Z23 Encounter for immunization: Secondary | ICD-10-CM | POA: Diagnosis not present

## 2020-07-04 ENCOUNTER — Ambulatory Visit (INDEPENDENT_AMBULATORY_CARE_PROVIDER_SITE_OTHER): Payer: Medicare Other | Admitting: Cardiovascular Disease

## 2020-07-04 ENCOUNTER — Encounter: Payer: Self-pay | Admitting: Cardiovascular Disease

## 2020-07-04 ENCOUNTER — Other Ambulatory Visit: Payer: Self-pay

## 2020-07-04 VITALS — BP 140/60 | HR 61 | Ht 60.0 in | Wt 101.4 lb

## 2020-07-04 DIAGNOSIS — I350 Nonrheumatic aortic (valve) stenosis: Secondary | ICD-10-CM | POA: Diagnosis not present

## 2020-07-04 DIAGNOSIS — I4892 Unspecified atrial flutter: Secondary | ICD-10-CM | POA: Diagnosis not present

## 2020-07-04 DIAGNOSIS — E782 Mixed hyperlipidemia: Secondary | ICD-10-CM | POA: Diagnosis not present

## 2020-07-04 DIAGNOSIS — I272 Pulmonary hypertension, unspecified: Secondary | ICD-10-CM | POA: Diagnosis not present

## 2020-07-04 DIAGNOSIS — I10 Essential (primary) hypertension: Secondary | ICD-10-CM

## 2020-07-04 DIAGNOSIS — I872 Venous insufficiency (chronic) (peripheral): Secondary | ICD-10-CM | POA: Diagnosis not present

## 2020-07-04 DIAGNOSIS — I080 Rheumatic disorders of both mitral and aortic valves: Secondary | ICD-10-CM | POA: Diagnosis not present

## 2020-07-04 MED ORDER — EZETIMIBE 10 MG PO TABS
10.0000 mg | ORAL_TABLET | Freq: Every day | ORAL | 3 refills | Status: DC
Start: 2020-07-04 — End: 2021-07-08

## 2020-07-04 MED ORDER — APIXABAN 2.5 MG PO TABS: 2.5000 mg | ORAL_TABLET | Freq: Two times a day (BID) | ORAL | 3 refills | Status: DC

## 2020-07-04 MED ORDER — METOPROLOL SUCCINATE ER 50 MG PO TB24
50.0000 mg | ORAL_TABLET | Freq: Every day | ORAL | 3 refills | Status: DC
Start: 2020-07-04 — End: 2021-09-04

## 2020-07-04 NOTE — Patient Instructions (Signed)
Medication Instructions:  No changes  If you need a refill on your cardiac medications before your next appointment, please call your pharmacy.    Lab work: No new labs needed   If you have labs (blood work) drawn today and your tests are completely normal, you will receive your results only by: . MyChart Message (if you have MyChart) OR . A paper copy in the mail If you have any lab test that is abnormal or we need to change your treatment, we will call you to review the results.   Testing/Procedures: No new testing needed   Follow-Up: At [facility], you and your health needs are our priority.  As part of our continuing mission to provide you with exceptional heart care, we have created designated Provider Care Teams.  These Care Teams include your primary Cardiologist (physician) and Advanced Practice Providers (APPs -  Physician Assistants and Nurse Practitioners) who all work together to provide you with the care you need, when you need it.  . You will need a follow up appointment in 6 months  . Providers on your designated Care Team:   . Christopher Berge, NP . Ryan Dunn, PA-C . Jacquelyn Visser, PA-C  Any Other Special Instructions Will Be Listed Below (If Applicable).  COVID-19 Vaccine Information can be found at: [email or web address] For questions related to vaccine distribution or appointments, please email [email or web address] or call [phone].     

## 2020-07-27 DIAGNOSIS — H903 Sensorineural hearing loss, bilateral: Secondary | ICD-10-CM | POA: Diagnosis not present

## 2020-07-27 DIAGNOSIS — H6123 Impacted cerumen, bilateral: Secondary | ICD-10-CM | POA: Diagnosis not present

## 2020-08-13 ENCOUNTER — Telehealth: Payer: Self-pay

## 2020-08-13 ENCOUNTER — Other Ambulatory Visit: Payer: Self-pay

## 2020-08-13 ENCOUNTER — Ambulatory Visit (INDEPENDENT_AMBULATORY_CARE_PROVIDER_SITE_OTHER): Payer: Medicare Other | Admitting: Internal Medicine

## 2020-08-13 ENCOUNTER — Encounter: Payer: Self-pay | Admitting: Internal Medicine

## 2020-08-13 VITALS — BP 148/60 | HR 63 | Temp 98.1°F | Resp 15 | Ht 60.0 in | Wt 104.6 lb

## 2020-08-13 DIAGNOSIS — D6869 Other thrombophilia: Secondary | ICD-10-CM | POA: Diagnosis not present

## 2020-08-13 DIAGNOSIS — I340 Nonrheumatic mitral (valve) insufficiency: Secondary | ICD-10-CM

## 2020-08-13 DIAGNOSIS — R634 Abnormal weight loss: Secondary | ICD-10-CM

## 2020-08-13 DIAGNOSIS — I350 Nonrheumatic aortic (valve) stenosis: Secondary | ICD-10-CM

## 2020-08-13 DIAGNOSIS — Z7189 Other specified counseling: Secondary | ICD-10-CM

## 2020-08-13 DIAGNOSIS — I480 Paroxysmal atrial fibrillation: Secondary | ICD-10-CM

## 2020-08-13 DIAGNOSIS — E786 Lipoprotein deficiency: Secondary | ICD-10-CM | POA: Diagnosis not present

## 2020-08-13 DIAGNOSIS — M810 Age-related osteoporosis without current pathological fracture: Secondary | ICD-10-CM | POA: Diagnosis not present

## 2020-08-13 NOTE — Telephone Encounter (Signed)
Pt was checking out and she mentioned she is supposed to have a prolia injection in Jan. I let her know that we will be in touch with her to schedule because it is not showing she has an appointment for Prolia. Will you please check on this for patient?

## 2020-08-13 NOTE — Patient Instructions (Addendum)
Good to see you !   No changes today/!  Keep walking and trusting in God  I'll see you in 6 months

## 2020-08-13 NOTE — Telephone Encounter (Signed)
Will you check on this when you return

## 2020-08-13 NOTE — Progress Notes (Signed)
Subjective:  Patient ID: Brandi [name], female    DOB: 1927/04/26  Age: 86 y.o. MRN: [id number]  CC: The primary encounter diagnosis was Age-related osteoporosis without current pathological fracture. Diagnoses of Low HDL (under 40), DNR (do not resuscitate) discussion, Abnormal intentional weight loss, Nonrheumatic aortic valve stenosis, Paroxysmal atrial fibrillation (HCC), Acquired thrombophilia (HCC), and Mitral valve insufficiency, unspecified etiology were also pertinent to this visit.  HPI Brandi [name] presents for follow up on unintentional weight loss, osteoporosis,  Atrial fib/flutter  This visit occurred during the SARS-CoV-2 public health emergency.  Safety protocols were in place, including screening questions prior to the visit, additional usage of staff PPE, and extensive cleaning of exam room while observing appropriate contact time as indicated for disinfecting solutions.   She feels generally well.  She is well dressed and calm. Still walking one mile daily,  Now indoors in the new track at [facility].   Not short of breath   Osteoporosis:  She is tolerating treatment with Prolia  No recent falls.  next dose is Due in January  Sleeping well,  In bed by 9:45.  Reads a Emergency planning/management officer  .  Falls asleep within 30 minutes.   Atrial flutter:  Asymptomatic. Denies dyspnea and orthopnea .  She is Bothered by the cost of Eliquis , but she has had no out of pocket cost yet.  (she is worried about the eventual cost of it when she enters the donut hole). She admits that she will not have any trouble affording it since she pays nothing for her other meds,  Including Prolia  Outpatient Medications Prior to Visit  Medication Sig Dispense Refill  . apixaban (ELIQUIS) 2.5 MG TABS tablet Take 1 tablet (2.5 mg total) by mouth 2 (two) times daily. 180 tablet 3  . CALCIUM 600 1500 (600 Ca) MG TABS tablet Take by mouth.    . Calcium Carbonate-Vit D-Min (CALCIUM 1200 PO) Take 600 mg by mouth.     . ezetimibe (ZETIA) 10 MG tablet Take 1 tablet (10 mg total) by mouth daily. 90 tablet 3  . furosemide (LASIX) 20 MG tablet TAKE 1 TABLET BY MOUTH  DAILY AS NEEDED FOR FLUID  RETENTION (Patient taking differently: Take 20 mg by mouth daily.) 90 tablet 2  . metoprolol succinate (TOPROL-XL) 50 MG 24 hr tablet Take 1 tablet (50 mg total) by mouth daily. Take with or immediately following a meal. 90 tablet 3  . Multiple Vitamin (MULTIVITAMIN) tablet Take 1 tablet by mouth daily.    . potassium chloride (KLOR-CON) 10 MEQ tablet Take 1 tablet (10 mEq total) by mouth daily as needed. 30 tablet 1   Facility-Administered Medications Prior to Visit  Medication Dose Route Frequency Provider Last Rate Last Admin  . denosumab (PROLIA) injection 60 mg  60 mg Subcutaneous Q6 months [name], MD   60 mg at 02/27/20 1106    Review of Systems;  Patient denies headache, fevers, malaise, unintentional weight loss, skin rash, eye pain, sinus congestion and sinus pain, sore throat, dysphagia,  hemoptysis , cough, dyspnea, wheezing, chest pain, palpitations, orthopnea, edema, abdominal pain, nausea, melena, diarrhea, constipation, flank pain, dysuria, hematuria, urinary  Frequency, nocturia, numbness, tingling, seizures,  Focal weakness, Loss of consciousness,  Tremor, insomnia, depression, anxiety, and suicidal ideation.      Objective:  BP (!) 148/60 (BP Location: Left Arm, Patient Position: Sitting, Cuff Size: Normal)   Pulse 63   Temp 98.1 F (36.7 C) (Oral)  Resp 15   Ht 5' (1.524 m)   Wt 104 lb 9.6 oz (47.4 kg)   SpO2 99%   BMI 20.43 kg/m   BP Readings from Last 3 Encounters:  08/13/20 (!) 148/60  07/04/20 140/60  05/22/20 (!) 160/80    Wt Readings from Last 3 Encounters:  08/13/20 104 lb 9.6 oz (47.4 kg)  07/04/20 101 lb 6 oz (46 kg)  05/22/20 104 lb (47.2 kg)    General appearance: alert, cooperative and appears stated age Ears: normal TM's and external ear canals both  ears Throat: lips, mucosa, and tongue normal; teeth and gums normal Neck: no adenopathy, no carotid bruit, supple, symmetrical, trachea midline and thyroid not enlarged, symmetric, no tenderness/mass/nodules Back: symmetric, no curvature. ROM normal. No CVA tenderness. Lungs: clear to auscultation bilaterally Heart: regular rate and rhythm, S1, S2 normal, no murmur, click, rub or gallop Abdomen: soft, non-tender; bowel sounds normal; no masses,  no organomegaly Pulses: 2+ and symmetric Skin: Skin color, texture, turgor normal. No rashes or lesions.  No edema Lymph nodes: Cervical, supraclavicular, and axillary nodes normal.  Lab Results  Component Value Date   HGBA1C 5.1 02/17/2020   HGBA1C 5.5 05/22/2017    Lab Results  Component Value Date   CREATININE 0.97 08/13/2020   CREATININE 0.97 03/09/2020   CREATININE 0.88 02/18/2020    Lab Results  Component Value Date   WBC 6.3 03/09/2020   HGB 13.6 03/09/2020   HCT 40.7 03/09/2020   PLT 268.0 03/09/2020   GLUCOSE 91 08/13/2020   CHOL 180 08/13/2020   TRIG 99.0 08/13/2020   HDL 53.50 08/13/2020   LDLDIRECT 91.0 10/31/2016   LDLCALC 107 (H) 08/13/2020   ALT 22 08/13/2020   AST 30 08/13/2020   NA 139 08/13/2020   K 4.5 08/13/2020   CL 105 08/13/2020   CREATININE 0.97 08/13/2020   BUN 19 08/13/2020   CO2 30 08/13/2020   TSH 2.25 03/09/2020   HGBA1C 5.1 02/17/2020    No results found.  Assessment & Plan:   Problem List Items Addressed This Visit      Unprioritized   Abnormal intentional weight loss    Secondary to overdiuresis.  She has gained 3 lbs since last visit.        Acquired thrombophilia (HCC)    She is tolerating Eliquis for reduction in embolic stroke risk due to atrial fibrillation       Aortic valve stenosis    Asymptomatic,  Continue annual follow up with cardiology       Atrial fibrillation (HCC)    Confirmed with ZIO monitor.  Rate controlled.  Embolic stroke risk managed with Eliquis        Mitral regurgitation    Mild to moderate ASYMPTOMATIC and active. .  No plans for surgery       Osteoporosis - Primary   Relevant Orders   VITAMIN D 25 Hydroxy (Vit-D Deficiency, Fractures) (Completed)   Comprehensive metabolic panel (Completed)    Other Visit Diagnoses    Low HDL (under 40)       Relevant Orders   Lipid panel (Completed)   DNR (do not resuscitate) discussion       Relevant Orders   DNR (Do Not Resuscitate)     I provided  30 minutes of  face-to-face time during this encounter reviewing patient's current problems and past surgeries, labs and imaging studies, providing counseling on the above mentioned problems , and coordination  of care .  I  am having Brandi [name]. Nedd maintain her multivitamin, furosemide, potassium chloride, Calcium Carbonate-Vit D-Min (CALCIUM 1200 PO), Calcium 600, apixaban, ezetimibe, and metoprolol succinate. We will continue to administer denosumab.  No orders of the defined types were placed in this encounter.   There are no discontinued medications.  Follow-up: No follow-ups on file.   [name], MD

## 2020-08-14 LAB — COMPREHENSIVE METABOLIC PANEL
ALT: 22 U/L (ref 0–35)
AST: 30 U/L (ref 0–37)
Albumin: 3.8 g/dL (ref 3.5–5.2)
Alkaline Phosphatase: 78 U/L (ref 39–117)
BUN: 19 mg/dL (ref 6–23)
CO2: 30 mEq/L (ref 19–32)
Calcium: 9.2 mg/dL (ref 8.4–10.5)
Chloride: 105 mEq/L (ref 96–112)
Creatinine, Ser: 0.97 mg/dL (ref 0.40–1.20)
GFR: 51.26 mL/min — ABNORMAL LOW (ref >60.00)
Glucose, Bld: 91 mg/dL (ref 70–99)
Potassium: 4.5 mEq/L (ref 3.5–5.1)
Sodium: 139 mEq/L (ref 135–145)
Total Bilirubin: 0.5 mg/dL (ref 0.2–1.2)
Total Protein: 6.6 g/dL (ref 6.0–8.3)

## 2020-08-14 LAB — LIPID PANEL
Cholesterol: 180 mg/dL (ref 0–200)
HDL: 53.5 mg/dL (ref >39.00)
LDL Cholesterol: 107 mg/dL — ABNORMAL HIGH (ref 0–99)
NonHDL: 126.3
Total CHOL/HDL Ratio: 3
Triglycerides: 99 mg/dL (ref 0.0–149.0)
VLDL: 19.8 mg/dL (ref 0.0–40.0)

## 2020-08-14 LAB — VITAMIN D 25 HYDROXY (VIT D DEFICIENCY, FRACTURES): VITD: 83.04 ng/mL (ref 30.00–100.00)

## 2020-08-14 NOTE — Assessment & Plan Note (Signed)
Mild to moderate ASYMPTOMATIC and active. .  No plans for surgery

## 2020-08-14 NOTE — Assessment & Plan Note (Signed)
Asymptomatic,  Continue annual follow up with cardiology

## 2020-08-14 NOTE — Assessment & Plan Note (Signed)
She is tolerating Eliquis for reduction in embolic stroke risk due to atrial fibrillation .  

## 2020-08-14 NOTE — Assessment & Plan Note (Signed)
Confirmed with ZIO monitor.  Rate controlled.  Embolic stroke risk managed with Eliquis

## 2020-08-14 NOTE — Assessment & Plan Note (Addendum)
Secondary to overdiuresis.  She has gained 3 lbs since last visit.

## 2020-08-21 NOTE — Telephone Encounter (Signed)
Prolia approved and scheduled for 09/03/20

## 2020-08-31 ENCOUNTER — Telehealth: Payer: Self-pay | Admitting: Internal Medicine

## 2020-08-31 NOTE — Telephone Encounter (Signed)
Office called due to weather left message to call and reschedule

## 2020-09-03 ENCOUNTER — Ambulatory Visit: Payer: Medicare Other

## 2020-09-10 ENCOUNTER — Other Ambulatory Visit: Payer: Self-pay

## 2020-09-10 ENCOUNTER — Ambulatory Visit (INDEPENDENT_AMBULATORY_CARE_PROVIDER_SITE_OTHER): Payer: Medicare Other

## 2020-09-10 DIAGNOSIS — M810 Age-related osteoporosis without current pathological fracture: Secondary | ICD-10-CM

## 2020-09-10 MED ORDER — DENOSUMAB 60 MG/ML ~~LOC~~ SOSY
60.0000 mg | PREFILLED_SYRINGE | Freq: Once | SUBCUTANEOUS | Status: AC
  Administered 2020-09-10: 60 mg via SUBCUTANEOUS

## 2020-09-10 NOTE — Progress Notes (Signed)
Patient presented for 6-month Prolia injection SQ to left arm. Patient tolerated well. 

## 2020-09-17 ENCOUNTER — Ambulatory Visit (INDEPENDENT_AMBULATORY_CARE_PROVIDER_SITE_OTHER): Payer: Medicare Other

## 2020-09-17 VITALS — Ht 60.0 in | Wt 104.0 lb

## 2020-09-17 DIAGNOSIS — Z0000 Encounter for general adult medical examination without abnormal findings: Secondary | ICD-10-CM | POA: Diagnosis not present

## 2020-09-17 NOTE — Progress Notes (Signed)
Subjective:   Brandi [name] is a 87 y.o. female who presents for Medicare Annual (Subsequent) preventive examination.  Review of Systems    No ROS.  Medicare Wellness Virtual Visit.   Cardiac Risk Factors include: advanced age (>62men, >74 women);hypertension     Objective:    Today's Vitals   09/17/20 1037  Weight: 104 lb (47.2 kg)  Height: 5' (1.524 m)   Body mass index is 20.31 kg/m.  Advanced Directives 09/17/2020 05/22/2020 05/01/2020 02/16/2020 02/16/2020 09/15/2019 09/10/2018  Does Patient Have a Medical Advance Directive? Yes Yes Yes Yes No Yes Yes  Type of Estate agent of [place];Living will Healthcare Power of [place];Living will Healthcare Power of [place];Living will Out of facility DNR (pink MOST or yellow form) - Healthcare Power of [place];Living will Healthcare Power of [place];Living will  Does patient want to make changes to medical advance directive? No - Patient declined No - Patient declined No - Patient declined No - Patient declined - No - Patient declined No - Patient declined  Copy of Healthcare Power of Attorney in Chart? Yes - validated most recent copy scanned in chart (See row information) No - copy requested No - copy requested - - No - copy requested No - copy requested  Would patient like information on creating a medical advance directive? - - - No - Patient declined No - Guardian declined - -  Pre-existing out of facility DNR order (yellow form or pink MOST form) - - - Physician notified to receive inpatient order - - -    Current Medications (verified) Outpatient Encounter Medications as of 09/17/2020  Medication Sig  . apixaban (ELIQUIS) 2.5 MG TABS tablet Take 1 tablet (2.5 mg total) by mouth 2 (two) times daily.  [name] CALCIUM 600 1500 (600 Ca) MG TABS tablet Take by mouth.  . Calcium Carbonate-Vit D-Min (CALCIUM 1200 PO) Take 600 mg by mouth.  . ezetimibe (ZETIA) 10 MG tablet Take 1 tablet (10 mg total) by mouth daily.  .  furosemide (LASIX) 20 MG tablet TAKE 1 TABLET BY MOUTH  DAILY AS NEEDED FOR FLUID  RETENTION (Patient taking differently: Take 20 mg by mouth daily.)  . metoprolol succinate (TOPROL-XL) 50 MG 24 hr tablet Take 1 tablet (50 mg total) by mouth daily. Take with or immediately following a meal.  . Multiple Vitamin (MULTIVITAMIN) tablet Take 1 tablet by mouth daily.  . potassium chloride (KLOR-CON) 10 MEQ tablet Take 1 tablet (10 mEq total) by mouth daily as needed.   Facility-Administered Encounter Medications as of 09/17/2020  Medication  . denosumab (PROLIA) injection 60 mg    Allergies (verified) Codeine, Statins, and Sulfonamide derivatives   History: Past Medical History:  Diagnosis Date  . Essential hypertension   . Hyperlipidemia   . Mitral valve prolapse   . Moderate to Severe Mitral regurgitation    a. 07/2018 Echo: EF 65-70%. Mild AS. Mild MVP w/ mild MS and mod-sev MR. Sev dil LA. Midly dil RA. PASP 40-85mmHg.  [name] PAF (paroxysmal atrial fibrillation) (HCC)    a. CHA2DS2VASc = 4-->Eliquis 2.5mg  BID; b. 08/2019 Event Monitor: 100% Afib (52-199, avg 96). 4 runs of VT vs afib w/ aberrancy.  [name] PAH (pulmonary artery hypertension) (HCC)    a. 07/2018 Echo: PASP 40-15mmHg in setting of mild MS and mod-sev MR.   Past Surgical History:  Procedure Laterality Date  . BIOPSY BREAST  1980   left ,  benign  . BREAST BIOPSY  1980  normal  . CATARACT EXTRACTION W/PHACO Left 05/01/2020   Procedure: CATARACT EXTRACTION PHACO AND INTRAOCULAR LENS PLACEMENT (IOC) LEFT 6.81 00:48.8;  Surgeon: Galen Manila, MD;  Location: [facility];  Service: Ophthalmology;  Laterality: Left;  . CATARACT EXTRACTION W/PHACO Right 05/22/2020   Procedure: CATARACT EXTRACTION PHACO AND INTRAOCULAR LENS PLACEMENT (IOC) RIGHT 7.41 00:43.3;  Surgeon: Galen Manila, MD;  Location: [facility];  Service: Ophthalmology;  Laterality: Right;  . Hospitalized for endocarditis  1987   Family History   Problem Relation Age of Onset  . Heart attack Father   . Coronary artery disease Father   . Cancer Sister        esophageal  . Heart disease Brother    Social History   Socioeconomic History  . Marital status: Widowed    Spouse name: Not on file  . Number of children: 0  . Years of education: Not on file  . Highest education level: Not on file  Occupational History  . Occupation: retired  Tobacco Use  . Smoking status: Never Smoker  . Smokeless tobacco: Never Used  Vaping Use  . Vaping Use: Never used  Substance and Sexual Activity  . Alcohol use: No  . Drug use: No  . Sexual activity: Not Currently  Other Topics Concern  . Not on file  Social History Narrative   [place]   Exercise, walks 7 days a week with aerobic exercise 2 days a week      Social Determinants of Health   Financial Resource Strain: Low Risk   . Difficulty of Paying Living Expenses: Not hard at all  Food Insecurity: No Food Insecurity  . Worried About Programme researcher, broadcasting/film/video in the Last Year: Never true  . Ran Out of Food in the Last Year: Never true  Transportation Needs: No Transportation Needs  . Lack of Transportation (Medical): No  . Lack of Transportation (Non-Medical): No  Physical Activity: Sufficiently Active  . Days of Exercise per Week: 7 days  . Minutes of Exercise per Session: 30 min  Stress: No Stress Concern Present  . Feeling of Stress : Not at all  Social Connections: Unknown  . Frequency of Communication with Friends and Family: More than three times a week  . Frequency of Social Gatherings with Friends and Family: Once a week  . Attends Religious Services: Not on file  . Active Member of Clubs or Organizations: Yes  . Attends Banker Meetings: Not on file  . Marital Status: Widowed    Tobacco Counseling Counseling given: Not Answered   Clinical Intake:  Pre-visit preparation completed: Yes        Diabetes: No  How often do you need to  have someone help you when you read instructions, pamphlets, or other written materials from your doctor or pharmacy?: 1 - Never   Interpreter Needed?: No      Activities of Daily Living In your present state of health, do you have any difficulty performing the following activities: 09/17/2020 05/22/2020  Hearing? Y N  Vision? N N  Difficulty concentrating or making decisions? N N  Walking or climbing stairs? N N  Dressing or bathing? N N  Doing errands, shopping? N -  Preparing Food and eating ? N -  Using the Toilet? N -  In the past six months, have you accidently leaked urine? N -  Do you have problems with loss of bowel control? N -  Managing your Medications?  N -  Managing your Finances? N -  Housekeeping or managing your Housekeeping? N -  Some recent data might be hidden    Patient Care Team: [name], MD as PCP - General (Internal Medicine) Antonieta Iba, MD as PCP - Cardiology (Cardiology) Antonieta Iba, MD as Consulting Physician (Cardiology)  Indicate any recent Medical Services you may have received from other than [facility] providers in the past year (date may be approximate).     Assessment:   This is a routine wellness examination for Gertie.  I connected with Staysha today by telephone and verified that I am speaking with the correct person using two identifiers. Location patient: home Location provider: work Persons participating in the virtual visit: patient, Engineer, civil (consulting).    I discussed the limitations, risks, security and privacy concerns of performing an evaluation and management service by telephone and the availability of in person appointments. The patient expressed understanding and verbally consented to this telephonic visit.    Interactive audio and video telecommunications were attempted between this provider and patient, however failed, due to patient having technical difficulties OR patient did not have access to video capability.  We continued  and completed visit with audio only.  Some vital signs may be absent or patient reported.   Hearing/Vision screen  Hearing Screening   125Hz  250Hz  500Hz  1000Hz  2000Hz  3000Hz  4000Hz  6000Hz  8000Hz   Right ear:           Left ear:           Comments: Hearing aid, bilateral   Vision Screening Comments: Followed by [facility]  Wears corrective lenses  Virtual visit.  Cataract extracted, bilateral   Dietary issues and exercise activities discussed: Current Exercise Habits: Home exercise routine, Type of exercise: walking, Time (Minutes): 30, Frequency (Times/Week): 7, Weekly Exercise (Minutes/Week): 210, Intensity: Mild  Goals    . Maintain Healthy Lifestyle     Stay active with walking Healthy diet Stay hydrated Continue with brain engaging activities      Depression Screen PHQ 2/9 Scores 09/17/2020 09/15/2019 09/10/2018 05/05/2018 09/09/2017 09/05/2016 07/15/2016  PHQ - 2 Score 0 0 0 0 0 0 0    Fall Risk Fall Risk  09/17/2020 08/13/2020 03/09/2020 02/27/2020 02/08/2020  Falls in the past year? 0 0 0 0 0  Number falls in past yr: 0 - - 0 -  Injury with Fall? - - - 0 -  Follow up Falls evaluation completed Falls evaluation completed Falls evaluation completed Falls evaluation completed Falls evaluation completed    FALL RISK PREVENTION PERTAINING TO THE HOME: Handrails in use when climbing stairs? Yes Home free of loose throw rugs in walkways, pet beds, electrical cords, etc? Yes  Adequate lighting in your home to reduce risk of falls? Yes   ASSISTIVE DEVICES UTILIZED TO PREVENT FALLS: Life alert? No  Use of a cane, walker or w/c? No  Grab bars in the bathroom? Yes  Shower chair or bench in shower? Yes  Elevated toilet seat or a handicapped toilet? No   TIMED UP AND GO: Was the test performed? No . Virtual visit.   Cognitive Function: Patient is alert and oriented x3.  Denies difficulty focusing, making decisions, memory loss.  Enjoys reading 2 newspapers daily,  devotionals.  MMSE/6CIT deferred. Normal by direct communication/observation.  MMSE - Mini Mental State Exam 09/05/2016 09/06/2015  Orientation to time 5 5  Orientation to Place 5 5  Registration 3 3  Attention/ Calculation 5 5  Recall  3 3  Language- name 2 objects 2 2  Language- repeat 1 1  Language- follow 3 step command 3 3  Language- read & follow direction 1 1  Write a sentence 1 1  Copy design 1 1  Total score 30 30     6CIT Screen 09/17/2020 09/15/2019 09/10/2018 09/09/2017  What Year? 0 points 0 points 0 points 0 points  What month? 0 points 0 points 0 points 0 points  What time? 0 points 0 points 0 points 0 points  Count back from 20 - 0 points 0 points 0 points  Months in reverse 2 points 0 points 0 points 0 points  Repeat phrase - - 0 points -  Total Score - - 0 -    Immunizations Immunization History  Administered Date(s) Administered  . 19-influenza Whole 06/11/2017  . Fluad Quad(high Dose 65+) 05/10/2019, 05/08/2020  . H1N1 10/12/2008  . Influenza Split 06/17/2011  . Influenza Whole 06/18/2008, 06/18/2009, 05/18/2010, 06/17/2010  . Influenza-Unspecified 05/18/2013, 05/23/2014, 06/18/2015, 06/03/2018  . Moderna Sars-Covid-2 Vaccination 09/01/2019, 09/29/2019, 07/03/2020  . Pneumococcal Conjugate-13 08/21/2014  . Pneumococcal Polysaccharide-23 08/18/1993, 07/28/2013  . Td 08/18/2005  . Tdap 12/27/2010  . Zoster 08/02/2010  . Zoster Recombinat (Shingrix) 09/21/2017, 06/16/2018   Health Maintenance Health Maintenance  Topic Date Due  . URINE MICROALBUMIN  Never done  . TETANUS/TDAP  12/26/2020  . OPHTHALMOLOGY EXAM  01/14/2021  . INFLUENZA VACCINE  Completed  . DEXA SCAN  Completed  . COVID-19 Vaccine  Completed  . PNA vac Low Risk Adult  Completed    Colorectal cancer screening: No longer required.   Mammogram- 2014.   Bone Density status: Completed 06/08/18. Results reflect: Bone density results: OSTEOPOROSIS. Repeat every 2. Prolia injections every 6  months.  years.  Lung Cancer Screening: (Low Dose CT Chest recommended if Age 38-80 years, 30 pack-year currently smoking OR have quit w/in 15years.) does not qualify.   Hepatitis C Screening: does not qualify.  Vision Screening: Recommended annual ophthalmology exams for early detection of glaucoma and other disorders of the eye. Is the patient up to date with their annual eye exam?  Yes  Who is the provider or what is the name of the office in which the patient attends annual eye exams? [facility]  Dental Screening: Recommended annual dental exams for proper oral hygiene.  Community Resource Referral / Chronic Care Management: CRR required this visit?  No   CCM required this visit?  No      Plan:   Keep all routine maintenance appointments.   Follow up 6/27/2 @ 1:30  I have personally reviewed and noted the following in the patient's chart:   . Medical and social history . Use of alcohol, tobacco or illicit drugs  . Current medications and supplements . Functional ability and status . Nutritional status . Physical activity . Advanced directives . List of other physicians . Hospitalizations, surgeries, and ER visits in previous 12 months . Vitals . Screenings to include cognitive, depression, and falls . Referrals and appointments  In addition, I have reviewed and discussed with patient certain preventive protocols, quality metrics, and best practice recommendations. A written personalized care plan for preventive services as well as general preventive health recommendations were provided to patient via mychart.     Ashok Pall, LPN   12/12/8339

## 2020-09-17 NOTE — Patient Instructions (Addendum)
Brandi [name] , Thank you for taking time to come for your Medicare Wellness Visit. I appreciate your ongoing commitment to your health goals. Please review the following plan we discussed and let me know if I can assist you in the future.   These are the goals we discussed: Goals    . Maintain Healthy Lifestyle     Stay active with walking Healthy diet Stay hydrated Continue with brain engaging activities       This is a list of the screening recommended for you and due dates:  Health Maintenance  Topic Date Due  . Urine Protein Check  Never done  . Tetanus Vaccine  12/26/2020  . Eye exam for diabetics  01/14/2021  . Flu Shot  Completed  . DEXA scan (bone density measurement)  Completed  . COVID-19 Vaccine  Completed  . Pneumonia vaccines  Completed    Immunizations Immunization History  Administered Date(s) Administered  . 19-influenza Whole 06/11/2017  . Fluad Quad(high Dose 65+) 05/10/2019, 05/08/2020  . H1N1 10/12/2008  . Influenza Split 06/17/2011  . Influenza Whole 06/18/2008, 06/18/2009, 05/18/2010, 06/17/2010  . Influenza-Unspecified 05/18/2013, 05/23/2014, 06/18/2015, 06/03/2018  . Moderna Sars-Covid-2 Vaccination 09/01/2019, 09/29/2019  . Pneumococcal Conjugate-13 08/21/2014  . Pneumococcal Polysaccharide-23 08/18/1993, 07/28/2013  . Td 08/18/2005  . Tdap 12/27/2010  . Zoster 08/02/2010  . Zoster Recombinat (Shingrix) 09/21/2017, 06/16/2018   Keep all routine maintenance appointments.   Follow up 02/11/21 @ 1:30  Advanced directives: on file  Conditions/risks identified: none new  Follow up in one year for your annual wellness visit    Preventive Care 65 Years and Older, Female Preventive care refers to lifestyle choices and visits with your health care provider that can promote health and wellness. What does preventive care include?  A yearly physical exam. This is also called an annual well check.  Dental exams once or twice a year.  Routine eye  exams. Ask your health care provider how often you should have your eyes checked.  Personal lifestyle choices, including:  Daily care of your teeth and gums.  Regular physical activity.  Eating a healthy diet.  Avoiding tobacco and drug use.  Limiting alcohol use.  Practicing safe sex.  Taking low-dose aspirin every day.  Taking vitamin and mineral supplements as recommended by your health care provider. What happens during an annual well check? The services and screenings done by your health care provider during your annual well check will depend on your age, overall health, lifestyle risk factors, and family history of disease. Counseling  Your health care provider may ask you questions about your:  Alcohol use.  Tobacco use.  Drug use.  Emotional well-being.  Home and relationship well-being.  Sexual activity.  Eating habits.  History of falls.  Memory and ability to understand (cognition).  Work and work Astronomer.  Reproductive health. Screening  You may have the following tests or measurements:  Height, weight, and BMI.  Blood pressure.  Lipid and cholesterol levels. These may be checked every 5 years, or more frequently if you are over 55 years old.  Skin check.  Lung cancer screening. You may have this screening every year starting at age 80 if you have a 30-pack-year history of smoking and currently smoke or have quit within the past 15 years.  Fecal occult blood test (FOBT) of the stool. You may have this test every year starting at age 12.  Flexible sigmoidoscopy or colonoscopy. You may have a sigmoidoscopy every 5 years or  a colonoscopy every 10 years starting at age 79.  Hepatitis C blood test.  Hepatitis B blood test.  Sexually transmitted disease (STD) testing.  Diabetes screening. This is done by checking your blood sugar (glucose) after you have not eaten for a while (fasting). You may have this done every 1-3 years.  Bone  density scan. This is done to screen for osteoporosis. You may have this done starting at age 92.  Mammogram. This may be done every 1-2 years. Talk to your health care provider about how often you should have regular mammograms. Talk with your health care provider about your test results, treatment options, and if necessary, the need for more tests. Vaccines  Your health care provider may recommend certain vaccines, such as:  Influenza vaccine. This is recommended every year.  Tetanus, diphtheria, and acellular pertussis (Tdap, Td) vaccine. You may need a Td booster every 10 years.  Zoster vaccine. You may need this after age 54.  Pneumococcal 13-valent conjugate (PCV13) vaccine. One dose is recommended after age 50.  Pneumococcal polysaccharide (PPSV23) vaccine. One dose is recommended after age 34. Talk to your health care provider about which screenings and vaccines you need and how often you need them. This information is not intended to replace advice given to you by your health care provider. Make sure you discuss any questions you have with your health care provider. Document Released: 08/31/2015 Document Revised: 04/23/2016 Document Reviewed: 06/05/2015 Elsevier Interactive Patient Education  2017 [place] [employer] Prevention in the Home Falls can cause injuries. They can happen to people of all ages. There are many things you can do to make your home safe and to help prevent falls. What can I do on the outside of my home?  Regularly fix the edges of walkways and driveways and fix any cracks.  Remove anything that might make you trip as you walk through a door, such as a raised step or threshold.  Trim any bushes or trees on the path to your home.  Use bright outdoor lighting.  Clear any walking paths of anything that might make someone trip, such as rocks or tools.  Regularly check to see if handrails are loose or broken. Make sure that both sides of any steps have  handrails.  Any raised decks and porches should have guardrails on the edges.  Have any leaves, snow, or ice cleared regularly.  Use sand or salt on walking paths during winter.  Clean up any spills in your garage right away. This includes oil or grease spills. What can I do in the bathroom?  Use night lights.  Install grab bars by the toilet and in the tub and shower. Do not use towel bars as grab bars.  Use non-skid mats or decals in the tub or shower.  If you need to sit down in the shower, use a plastic, non-slip stool.  Keep the floor dry. Clean up any water that spills on the floor as soon as it happens.  Remove soap buildup in the tub or shower regularly.  Attach bath mats securely with double-sided non-slip rug tape.  Do not have throw rugs and other things on the floor that can make you trip. What can I do in the bedroom?  Use night lights.  Make sure that you have a light by your bed that is easy to reach.  Do not use any sheets or blankets that are too big for your bed. They should not hang down onto  the floor.  Have a firm chair that has side arms. You can use this for support while you get dressed.  Do not have throw rugs and other things on the floor that can make you trip. What can I do in the kitchen?  Clean up any spills right away.  Avoid walking on wet floors.  Keep items that you use a lot in easy-to-reach places.  If you need to reach something above you, use a strong step stool that has a grab bar.  Keep electrical cords out of the way.  Do not use floor polish or wax that makes floors slippery. If you must use wax, use non-skid floor wax.  Do not have throw rugs and other things on the floor that can make you trip. What can I do with my stairs?  Do not leave any items on the stairs.  Make sure that there are handrails on both sides of the stairs and use them. Fix handrails that are broken or loose. Make sure that handrails are as long as  the stairways.  Check any carpeting to make sure that it is firmly attached to the stairs. Fix any carpet that is loose or worn.  Avoid having throw rugs at the top or bottom of the stairs. If you do have throw rugs, attach them to the floor with carpet tape.  Make sure that you have a light switch at the top of the stairs and the bottom of the stairs. If you do not have them, ask someone to add them for you. What else can I do to help prevent falls?  Wear shoes that:  Do not have high heels.  Have rubber bottoms.  Are comfortable and fit you well.  Are closed at the toe. Do not wear sandals.  If you use a stepladder:  Make sure that it is fully opened. Do not climb a closed stepladder.  Make sure that both sides of the stepladder are locked into place.  Ask someone to hold it for you, if possible.  Clearly mark and make sure that you can see:  Any grab bars or handrails.  First and last steps.  Where the edge of each step is.  Use tools that help you move around (mobility aids) if they are needed. These include:  Canes.  Walkers.  Scooters.  Crutches.  Turn on the lights when you go into a dark area. Replace any light bulbs as soon as they burn out.  Set up your furniture so you have a clear path. Avoid moving your furniture around.  If any of your floors are uneven, fix them.  If there are any pets around you, be aware of where they are.  Review your medicines with your doctor. Some medicines can make you feel dizzy. This can increase your chance of falling. Ask your doctor what other things that you can do to help prevent falls. This information is not intended to replace advice given to you by your health care provider. Make sure you discuss any questions you have with your health care provider. Document Released: 05/31/2009 Document Revised: 01/10/2016 Document Reviewed: 09/08/2014 Elsevier Interactive Patient Education  2017 [employer].

## 2020-12-28 ENCOUNTER — Other Ambulatory Visit: Payer: Self-pay | Admitting: Cardiovascular Disease

## 2021-01-01 ENCOUNTER — Ambulatory Visit (INDEPENDENT_AMBULATORY_CARE_PROVIDER_SITE_OTHER): Payer: Medicare Other | Admitting: Cardiovascular Disease

## 2021-01-01 ENCOUNTER — Other Ambulatory Visit: Payer: Self-pay

## 2021-01-01 ENCOUNTER — Encounter: Payer: Self-pay | Admitting: Cardiovascular Disease

## 2021-01-01 VITALS — BP 130/60 | HR 57 | Ht <= 58 in | Wt 104.4 lb

## 2021-01-01 DIAGNOSIS — I272 Pulmonary hypertension, unspecified: Secondary | ICD-10-CM

## 2021-01-01 DIAGNOSIS — I480 Paroxysmal atrial fibrillation: Secondary | ICD-10-CM

## 2021-01-01 DIAGNOSIS — I340 Nonrheumatic mitral (valve) insufficiency: Secondary | ICD-10-CM

## 2021-01-01 DIAGNOSIS — I10 Essential (primary) hypertension: Secondary | ICD-10-CM

## 2021-01-01 DIAGNOSIS — E782 Mixed hyperlipidemia: Secondary | ICD-10-CM

## 2021-01-01 NOTE — Progress Notes (Signed)
Cardiology Office Note  Date:  01/01/2021   ID:  ALVILDA MCKENNA, DOB [birth year], MRN [id number]  PCP:  [name], MD   Chief Complaint  Patient presents with  . 6 month follow up     "doing well." Medications reviewed by the patient verbally.     HPI:  87 year old woman with  anterior leaflet MVP and moderate to severe MR, in 2018 Mild to moderate  aortic valve stenosis Moderate to severe TR, RVSP 54 in 2018 Hyperlipidemia Lives at [facility] who presents for routine followup of her mitral and aortic valve disease, atrial fibrillation   Lives at [facility] 1 mile per day Exercise class 3x a week No sx of shortness of breath, no edema Sleeping ok, no PND/orthopnea  rare tachycardia palpitation Does not take lasix/potassium on a regular basis, very sparingly for ankle swelling  EKG personally reviewed by myself on todays visit NSR rate 57, PACs, PVCs  event monitor 09/2019 was 100% atrial fib  Echo 02/2020 reviewed with her EF 60% Moderate MR, mild TR Mild AS  March 2021  back in normal sinus rhythm Tolerating Eliquis low-dose with metoprolol, no complaints  Event monitor, February 2021  discussed in detail Rhythm is atrial fibrillation/flutter, 100% burden ranging from 52-199 bpm (avg of 96 bpm). 4 Ventricular Tachycardia (unable to exclude atrial fibrillation with aberrancy) runs occurred, the run with the fastest interval lasting 9 beats with a max rate of 193 bpm, the longest lasting 10 beats with an avg rate of 163 bpm.  No patient triggered Isolated VEs were occasional (1.8%, 32223), VE Couplets were rare (<1.0%, 58), and VE Triplets were rare (<1.0%, 2). Ventricular Bigeminy and Trigeminy were present. Patient triggered events were not associated with significant arrhythmia  Echo 07/2018 Left ventricle: The cavity size was normal. Wall thickness was   increased in a pattern of mild LVH. Systolic function was   vigorous. The estimated ejection  fraction was in the range of 65%   to 70%.  - Aortic valve:  mild stenosis. - Mitral valve:  Mild prolapse. mild stenosis.  moderate to   severe regurgitation directed posteriorly. - Left atrium: The atrium was severely dilated. - Pulmonary arteries: Systolic pressure was moderately increased,   in the range of 40 mm Hg to 45 mm Hg plus central venous/right   atrial pressure.   PMH:   has a past medical history of Essential hypertension, Hyperlipidemia, Mitral valve prolapse, Moderate to Severe Mitral regurgitation, PAF (paroxysmal atrial fibrillation) (HCC), and PAH (pulmonary artery hypertension) (HCC).  PSH:    Past Surgical History:  Procedure Laterality Date  . BIOPSY BREAST  1980   left ,  benign  . BREAST BIOPSY  1980   normal  . CATARACT EXTRACTION W/PHACO Left 05/01/2020   Procedure: CATARACT EXTRACTION PHACO AND INTRAOCULAR LENS PLACEMENT (IOC) LEFT 6.81 00:48.8;  Surgeon: Galen Manila, MD;  Location: [facility];  Service: Ophthalmology;  Laterality: Left;  . CATARACT EXTRACTION W/PHACO Right 05/22/2020   Procedure: CATARACT EXTRACTION PHACO AND INTRAOCULAR LENS PLACEMENT (IOC) RIGHT 7.41 00:43.3;  Surgeon: Galen Manila, MD;  Location: [facility];  Service: Ophthalmology;  Laterality: Right;  . Hospitalized for endocarditis  1987    Current Outpatient Medications  Medication Sig Dispense Refill  . apixaban (ELIQUIS) 2.5 MG TABS tablet Take 1 tablet (2.5 mg total) by mouth 2 (two) times daily. 180 tablet 3  . CALCIUM 600 1500 (600 Ca) MG TABS tablet  Take by mouth.    . Calcium Carbonate-Vit D-Min (CALCIUM 1200 PO) Take 600 mg by mouth.    . ezetimibe (ZETIA) 10 MG tablet Take 1 tablet (10 mg total) by mouth daily. 90 tablet 3  . metoprolol succinate (TOPROL-XL) 50 MG 24 hr tablet Take 1 tablet (50 mg total) by mouth daily. Take with or immediately following a meal. 90 tablet 3  . Multiple Vitamin (MULTIVITAMIN) tablet Take 1 tablet by mouth daily.     . potassium chloride (KLOR-CON) 10 MEQ tablet TAKE 1 TABLET BY MOUTH  DAILY 90 tablet 3   Current Facility-Administered Medications  Medication Dose Route Frequency Provider Last Rate Last Admin  . denosumab (PROLIA) injection 60 mg  60 mg Subcutaneous Q6 months [name], MD   60 mg at 02/27/20 1106     Allergies:   Codeine, Statins, and Sulfonamide derivatives   Social History:  The patient  reports that she has never smoked. She has never used smokeless tobacco. She reports that she does not drink alcohol and does not use drugs.   Family History:   family history includes Cancer in her sister; Coronary artery disease in her father; Heart attack in her father; Heart disease in her brother.    Review of Systems: Review of Systems  Constitutional: Negative.   HENT: Negative.   Respiratory: Negative.   Cardiovascular: Negative.   Gastrointestinal: Negative.   Musculoskeletal: Negative.   Neurological: Negative.   Psychiatric/Behavioral: Negative.   All other systems reviewed and are negative.   PHYSICAL EXAM: VS:  Ht 4\' 9"  (1.448 m)   Wt 104 lb 6 oz (47.3 kg)   BMI 22.59 kg/m  , BMI Body mass index is 22.59 kg/m. Constitutional:  oriented to person, place, and time. No distress.  HENT:  Head: Grossly normal Eyes:  no discharge. No scleral icterus.  Neck: No JVD, no carotid bruits  Cardiovascular: Regular rate and rhythm, no murmurs appreciated Pulmonary/Chest: Clear to auscultation bilaterally, no wheezes or rails Abdominal: Soft.  no distension.  no tenderness.  Musculoskeletal: Normal range of motion Neurological:  normal muscle tone. Coordination normal. No atrophy Skin: Skin warm and dry Psychiatric: normal affect, pleasant  Recent Labs: 02/16/2020: B Natriuretic Peptide 988.6 02/18/2020: Magnesium 2.0 03/09/2020: Hemoglobin 13.6; Platelets 268.0; TSH 2.25 08/13/2020: ALT 22; BUN 19; Creatinine, Ser 0.97; Potassium 4.5; Sodium 139    Lipid Panel Lab  Results  Component Value Date   CHOL 180 08/13/2020   HDL 53.50 08/13/2020   LDLCALC 107 (H) 08/13/2020   TRIG 99.0 08/13/2020      Wt Readings from Last 3 Encounters:  01/01/21 104 lb 6 oz (47.3 kg)  09/17/20 104 lb (47.2 kg)  08/13/20 104 lb 9.6 oz (47.4 kg)     ASSESSMENT AND PLAN:  Atrial flutter on Eliquis, metoprolol succinate 50 daily, Arrhythmia jan and feb 2021 NSR today, with ectopy Suggested she monitor pulse at home  Mixed hyperlipidemia Tolerating Zetia Stable, numbers great  Chronic venous insufficiency  recommend compression hose No edema today  MITRAL REGURGITATION No SOB Mitral valve prolapse, known Moderate MR Previous did not want surgery No symptoms  Aortic valve stenosis Mild aortic valve stenosis on recent echocardiogram No sx  Pulmonary HTN With moderate Tricuspid regurg Denies SOB Lasix as needed for swelling, does not want to take daily   Total encounter time more than 25 minutes  Greater than 50% was spent in counseling and coordination of care with the patient  No orders of the defined types were placed in this encounter.    Signed, Dossie Arbour, M.D., Ph.D. 01/01/2021  [facility] [place], [place] [phone]

## 2021-01-01 NOTE — Patient Instructions (Signed)
Medication Instructions:  No changes  If you need a refill on your cardiac medications before your next appointment, please call your pharmacy.    Lab work: No new labs needed   If you have labs (blood work) drawn today and your tests are completely normal, you will receive your results only by: . MyChart Message (if you have MyChart) OR . A paper copy in the mail If you have any lab test that is abnormal or we need to change your treatment, we will call you to review the results.   Testing/Procedures: No new testing needed   Follow-Up: At [facility], you and your health needs are our priority.  As part of our continuing mission to provide you with exceptional heart care, we have created designated Provider Care Teams.  These Care Teams include your primary Cardiologist (physician) and Advanced Practice Providers (APPs -  Physician Assistants and Nurse Practitioners) who all work together to provide you with the care you need, when you need it.  . You will need a follow up appointment in 6 months  . Providers on your designated Care Team:   . Christopher Berge, NP . Ryan Dunn, PA-C . Jacquelyn Visser, PA-C  Any Other Special Instructions Will Be Listed Below (If Applicable).  COVID-19 Vaccine Information can be found at: [email or web address] For questions related to vaccine distribution or appointments, please email [email or web address] or call [phone].     

## 2021-01-02 DIAGNOSIS — H35372 Puckering of macula, left eye: Secondary | ICD-10-CM | POA: Diagnosis not present

## 2021-01-03 DIAGNOSIS — Z23 Encounter for immunization: Secondary | ICD-10-CM | POA: Diagnosis not present

## 2021-01-09 ENCOUNTER — Telehealth: Payer: Self-pay | Admitting: Internal Medicine

## 2021-01-09 NOTE — Telephone Encounter (Signed)
Spoke with pt and she stated that yesterday she tested positive for covid at [facility]. Pt stated that she is coughing but it is getting better. All other symptoms she had have now gone. Pt stated that she is feeling a lot better. Pt wanted to know if you thought she would even need to take the antiviral medication.

## 2021-01-09 NOTE — Telephone Encounter (Signed)
PT is calling in to see if Dr.Tullo will give her permission to take Paxlovid that her Sons Doctor gave her. He advise her to take it within 5 days of having covid with the doctors permission.

## 2021-01-09 NOTE — Telephone Encounter (Signed)
No  She does not need to take the antivral

## 2021-01-09 NOTE — Telephone Encounter (Signed)
Pt is aware and gave a verbal understanding.  

## 2021-01-15 ENCOUNTER — Telehealth: Payer: Self-pay | Admitting: Internal Medicine

## 2021-01-15 NOTE — Telephone Encounter (Signed)
Patient called wanted Dr.Tullo to know that she tested at [facility] and she is still positive for covid

## 2021-01-17 NOTE — Telephone Encounter (Signed)
Yes she will test positive for a while, but she can come out of quarantine as planned

## 2021-01-17 NOTE — Telephone Encounter (Signed)
Pt aware.

## 2021-01-17 NOTE — Telephone Encounter (Signed)
Spoke with pt and she got tested again after the original when she should not have been and it came back positive. Pt is aware that she could possibly test positive for up to 90 days after having covid. Pt stated that she has not been sick the whole time and gets to come out of quarantine tomorrow.

## 2021-02-11 ENCOUNTER — Ambulatory Visit: Payer: Medicare Other | Admitting: Internal Medicine

## 2021-02-12 ENCOUNTER — Ambulatory Visit (INDEPENDENT_AMBULATORY_CARE_PROVIDER_SITE_OTHER): Payer: Medicare Other | Admitting: Internal Medicine

## 2021-02-12 ENCOUNTER — Other Ambulatory Visit: Payer: Self-pay

## 2021-02-12 ENCOUNTER — Encounter: Payer: Self-pay | Admitting: Internal Medicine

## 2021-02-12 VITALS — BP 114/64 | HR 47 | Temp 95.5°F | Resp 15 | Ht <= 58 in | Wt 103.0 lb

## 2021-02-12 DIAGNOSIS — Z79899 Other long term (current) drug therapy: Secondary | ICD-10-CM

## 2021-02-12 DIAGNOSIS — D6869 Other thrombophilia: Secondary | ICD-10-CM

## 2021-02-12 DIAGNOSIS — E782 Mixed hyperlipidemia: Secondary | ICD-10-CM

## 2021-02-12 DIAGNOSIS — I350 Nonrheumatic aortic (valve) stenosis: Secondary | ICD-10-CM

## 2021-02-12 DIAGNOSIS — M810 Age-related osteoporosis without current pathological fracture: Secondary | ICD-10-CM

## 2021-02-12 DIAGNOSIS — R41840 Attention and concentration deficit: Secondary | ICD-10-CM

## 2021-02-12 DIAGNOSIS — R634 Abnormal weight loss: Secondary | ICD-10-CM

## 2021-02-12 DIAGNOSIS — I10 Essential (primary) hypertension: Secondary | ICD-10-CM

## 2021-02-12 DIAGNOSIS — I480 Paroxysmal atrial fibrillation: Secondary | ICD-10-CM | POA: Diagnosis not present

## 2021-02-12 NOTE — Patient Instructions (Addendum)
WASA  crackers  are delicious, low carb and available  in  sourdough  . The are made in [place] so look for them in the "imported" or ethnic section  Premier protein shakes have a delicious chocolate flavor and give you 30 grams protein

## 2021-02-12 NOTE — Assessment & Plan Note (Signed)
She is tolerating Eliquis for reduction in embolic stroke risk due to atrial fibrillation . CBC ordered for today

## 2021-02-12 NOTE — Assessment & Plan Note (Signed)
She has lost 2 lbs in the past 12 months,  8 in the last 4 months.   I have reviewed her diet and recommended that she increase her protein and fat intake while monitoring her carbohydrates.

## 2021-02-12 NOTE — Assessment & Plan Note (Signed)
Mild, by last ECHO in 2019.  Not symptomatic. BP well controlled on minimal meds

## 2021-02-12 NOTE — Progress Notes (Signed)
Subjective:  Patient ID: Brandi [name], female    DOB: Dec 07, 1926  Age: 87 y.o. MRN: [id number]  CC: The primary encounter diagnosis was Mixed hyperlipidemia. Diagnoses of Long-term use of high-risk medication, Nonrheumatic aortic valve stenosis, Abnormal intentional weight loss, Paroxysmal atrial fibrillation (HCC), Acquired thrombophilia (HCC), Age-related osteoporosis without current pathological fracture, Essential hypertension, and Cognitive attention deficit were also pertinent to this visit.  HPI [name] [name] presents for follow up on aortic atherosclerosis , atrial fibrillation and hypertension  complicated by MR, pulmonary hypertension and mild AS   This visit occurred during the SARS-CoV-2 public health emergency.  Safety protocols were in place, including screening questions prior to the visit, additional usage of staff PPE, and extensive cleaning of exam room while observing appropriate contact time as indicated for disinfecting solutions.   Lillien has no complaints today.   Atrial fibrillation:  she saw Dr Mariah Milling in may  no changes to regimen  Was in SR   No recent falls.  Very careful but not afraid.  She is still walking 1 mile daily inside track of facility . Stays busy,  for memory she is daily doing crossword puzzles,   wooden jigsaw puzzles on the DR table (1000 pieces) . Has done about 25,  does them over again (why not?)  Underweight  :  down 8 lbs from 2014.    Only 2 lbs in the last year.  Diet reviewed. Eats 3 meals daily and snacks. Likes chocolate so eats a lot some nights  Occasional insomnia ; mind races.  Not often. Denies anxiety .   No incontinence ,  never had children,  no constipation.  Takes a stool softener nightly , no loose stools in years.    Outpatient Medications Prior to Visit  Medication Sig Dispense Refill   apixaban (ELIQUIS) 2.5 MG TABS tablet Take 1 tablet (2.5 mg total) by mouth 2 (two) times daily. 180 tablet 3   CALCIUM 600 1500 (600 Ca) MG  TABS tablet Take by mouth.     Calcium Carbonate-Vit D-Min (CALCIUM 1200 PO) Take 600 mg by mouth.     ezetimibe (ZETIA) 10 MG tablet Take 1 tablet (10 mg total) by mouth daily. 90 tablet 3   metoprolol succinate (TOPROL-XL) 50 MG 24 hr tablet Take 1 tablet (50 mg total) by mouth daily. Take with or immediately following a meal. 90 tablet 3   Multiple Vitamin (MULTIVITAMIN) tablet Take 1 tablet by mouth daily.     potassium chloride (KLOR-CON) 10 MEQ tablet TAKE 1 TABLET BY MOUTH  DAILY 90 tablet 3   Facility-Administered Medications Prior to Visit  Medication Dose Route Frequency Provider Last Rate Last Admin   denosumab (PROLIA) injection 60 mg  60 mg Subcutaneous Q6 months [name], MD   60 mg at 02/27/20 1106    Review of Systems;  Patient denies headache, fevers, malaise, unintentional weight loss, skin rash, eye pain, sinus congestion and sinus pain, sore throat, dysphagia,  hemoptysis , cough, dyspnea, wheezing, chest pain, palpitations, orthopnea, edema, abdominal pain, nausea, melena, diarrhea, constipation, flank pain, dysuria, hematuria, urinary  Frequency, nocturia, numbness, tingling, seizures,  Focal weakness, Loss of consciousness,  Tremor, insomnia, depression, anxiety, and suicidal ideation.      Objective:  BP 114/64 (BP Location: Left Arm, Patient Position: Sitting, Cuff Size: Normal)   Pulse (!) 47   Temp (!) 95.5 F (35.3 C) (Temporal)   Resp 15   Ht 4\' 9"  (1.448 m)  Wt 103 lb (46.7 kg)   SpO2 99%   BMI 22.29 kg/m   BP Readings from Last 3 Encounters:  02/12/21 114/64  01/01/21 130/60  08/13/20 (!) 148/60    Wt Readings from Last 3 Encounters:  02/12/21 103 lb (46.7 kg)  01/01/21 104 lb 6 oz (47.3 kg)  09/17/20 104 lb (47.2 kg)    General appearance: alert, cooperative and appears stated age Ears: normal TM's and external ear canals both ears Throat: lips, mucosa, and tongue normal; teeth and gums normal Neck: no adenopathy, no carotid bruit,  supple, symmetrical, trachea midline and thyroid not enlarged, symmetric, no tenderness/mass/nodules Back: symmetric, no curvature. ROM normal. No CVA tenderness. Lungs: clear to auscultation bilaterally Heart: regular rate and rhythm, S1, S2 normal, no murmur, click, rub or gallop Abdomen: soft, non-tender; bowel sounds normal; no masses,  no organomegaly Pulses: 2+ and symmetric Skin: Skin color, texture, turgor normal. No rashes or lesions Lymph nodes: Cervical, supraclavicular, and axillary nodes normal.  Lab Results  Component Value Date   HGBA1C 5.1 02/17/2020   HGBA1C 5.5 05/22/2017    Lab Results  Component Value Date   CREATININE 1.02 02/12/2021   CREATININE 0.97 08/13/2020   CREATININE 0.97 03/09/2020    Lab Results  Component Value Date   WBC 6.6 02/12/2021   HGB 13.4 02/12/2021   HCT 40.8 02/12/2021   PLT 190.0 02/12/2021   GLUCOSE 81 02/12/2021   CHOL 173 02/12/2021   TRIG 73.0 02/12/2021   HDL 53.30 02/12/2021   LDLDIRECT 91.0 10/31/2016   LDLCALC 105 (H) 02/12/2021   ALT 15 02/12/2021   AST 30 02/12/2021   NA 140 02/12/2021   K 4.6 02/12/2021   CL 105 02/12/2021   CREATININE 1.02 02/12/2021   BUN 22 02/12/2021   CO2 32 02/12/2021   TSH 2.25 03/09/2020   HGBA1C 5.1 02/17/2020    No results found.  Assessment & Plan:   Problem List Items Addressed This Visit       Unprioritized   Abnormal intentional weight loss    She has lost 2 lbs in the past 12 months,  8 in the last 4 months.   I have reviewed her diet and recommended that she increase her protein and fat intake while monitoring her carbohydrates.        Acquired thrombophilia (HCC)    She is tolerating Eliquis for reduction in embolic stroke risk due to atrial fibrillation . CBC ordered for today        Aortic valve stenosis    Mild, by last ECHO in 2019.  Not symptomatic. BP well controlled on minimal meds        Atrial fibrillation (HCC)    Managed with metoprolol.  Continue  Eliquis for embolic stroke risk mitigation       Cognitive attention deficit    Patient was noted after office visit to display some confusion about whether she drive to the office ,  Noted by our phlebotomist.  We will need to address this and her ability to drive        Essential hypertension    Well controlled on current regimen of metoprolol only . Renal function stable, no changes today.       Hyperlipidemia - Primary   Relevant Orders   Comprehensive metabolic panel (Completed)   Lipid panel (Completed)   Osteoporosis    Managed with prolia.   No history of fractures.  Last DEXA 2019.  Last injection jan 2022.  Other Visit Diagnoses     Long-term use of high-risk medication       Relevant Orders   CBC with Differential/Platelet (Completed)      I provided  30 minutes of  face-to-face time during this encounter reviewing patient's hypertension , cognivite changes, and past surgeries, labs and imaging studies, providing counseling on the above mentioned problems , and coordination  of care .   I am having Shelma Eiben. Mahlum maintain her multivitamin, Calcium Carbonate-Vit D-Min (CALCIUM 1200 PO), Calcium 600, apixaban, ezetimibe, metoprolol succinate, and potassium chloride. We will continue to administer denosumab.  No orders of the defined types were placed in this encounter.   There are no discontinued medications.  Follow-up: Return in about 6 months (around 08/14/2021).   [name], MD

## 2021-02-12 NOTE — Assessment & Plan Note (Signed)
Managed with metoprolol.  Continue Eliquis for embolic stroke risk mitigation

## 2021-02-12 NOTE — Assessment & Plan Note (Signed)
Managed with Zetia.  Tolerating without side effects    Lab Results  Component Value Date   CHOL 180 08/13/2020   HDL 53.50 08/13/2020   LDLCALC 107 (H) 08/13/2020   LDLDIRECT 91.0 10/31/2016   TRIG 99.0 08/13/2020   CHOLHDL 3 08/13/2020

## 2021-02-12 NOTE — Assessment & Plan Note (Signed)
Well controlled on current regimen of metoprolol only . Renal function stable, no changes today.

## 2021-02-12 NOTE — Assessment & Plan Note (Signed)
Managed with prolia.   No history of fractures.  Last DEXA 2019.  Last injection jan 2022.

## 2021-02-13 DIAGNOSIS — R41840 Attention and concentration deficit: Secondary | ICD-10-CM | POA: Insufficient documentation

## 2021-02-13 LAB — LIPID PANEL
Cholesterol: 173 mg/dL (ref 0–200)
HDL: 53.3 mg/dL (ref >39.00)
LDL Cholesterol: 105 mg/dL — ABNORMAL HIGH (ref 0–99)
NonHDL: 119.28
Total CHOL/HDL Ratio: 3
Triglycerides: 73 mg/dL (ref 0.0–149.0)
VLDL: 14.6 mg/dL (ref 0.0–40.0)

## 2021-02-13 LAB — COMPREHENSIVE METABOLIC PANEL
ALT: 15 U/L (ref 0–35)
AST: 30 U/L (ref 0–37)
Albumin: 3.8 g/dL (ref 3.5–5.2)
Alkaline Phosphatase: 63 U/L (ref 39–117)
BUN: 22 mg/dL (ref 6–23)
CO2: 32 mEq/L (ref 19–32)
Calcium: 10.3 mg/dL (ref 8.4–10.5)
Chloride: 105 mEq/L (ref 96–112)
Creatinine, Ser: 1.02 mg/dL (ref 0.40–1.20)
GFR: 48.09 mL/min — ABNORMAL LOW (ref >60.00)
Glucose, Bld: 81 mg/dL (ref 70–99)
Potassium: 4.6 mEq/L (ref 3.5–5.1)
Sodium: 140 mEq/L (ref 135–145)
Total Bilirubin: 0.6 mg/dL (ref 0.2–1.2)
Total Protein: 6.6 g/dL (ref 6.0–8.3)

## 2021-02-13 LAB — CBC WITH DIFFERENTIAL/PLATELET
Basophils Absolute: 0.1 10*3/uL (ref 0.0–0.1)
Basophils Relative: 1.3 % (ref 0.0–3.0)
Eosinophils Absolute: 0.2 10*3/uL (ref 0.0–0.7)
Eosinophils Relative: 2.6 % (ref 0.0–5.0)
HCT: 40.8 % (ref 36.0–46.0)
Hemoglobin: 13.4 g/dL (ref 12.0–15.0)
Lymphocytes Relative: 22.5 % (ref 12.0–46.0)
Lymphs Abs: 1.5 10*3/uL (ref 0.7–4.0)
MCHC: 32.9 g/dL (ref 30.0–36.0)
MCV: 96.8 fl (ref 78.0–100.0)
Monocytes Absolute: 0.7 10*3/uL (ref 0.1–1.0)
Monocytes Relative: 10.5 % (ref 3.0–12.0)
Neutro Abs: 4.2 10*3/uL (ref 1.4–7.7)
Neutrophils Relative %: 63.1 % (ref 43.0–77.0)
Platelets: 190 10*3/uL (ref 150.0–400.0)
RBC: 4.21 Mil/uL (ref 3.87–5.11)
RDW: 14.9 % (ref 11.5–15.5)
WBC: 6.6 10*3/uL (ref 4.0–10.5)

## 2021-02-13 NOTE — Assessment & Plan Note (Addendum)
Patient was noted after office visit to display some confusion about whether she drive to the office ,  Noted by our phlebotomist.  We will need to address this and her ability to drive

## 2021-02-20 ENCOUNTER — Telehealth: Payer: Self-pay | Admitting: Internal Medicine

## 2021-02-20 NOTE — Telephone Encounter (Signed)
Prolia approved patient scheduled. 

## 2021-02-20 NOTE — Telephone Encounter (Signed)
Patient called and would like to schedule her prolia injection. Can this be scheduled?

## 2021-03-11 ENCOUNTER — Other Ambulatory Visit: Payer: Self-pay

## 2021-03-11 ENCOUNTER — Ambulatory Visit (INDEPENDENT_AMBULATORY_CARE_PROVIDER_SITE_OTHER): Payer: Medicare Other | Admitting: *Deleted

## 2021-03-11 DIAGNOSIS — M810 Age-related osteoporosis without current pathological fracture: Secondary | ICD-10-CM

## 2021-03-11 MED ORDER — DENOSUMAB 60 MG/ML ~~LOC~~ SOSY
60.0000 mg | PREFILLED_SYRINGE | Freq: Once | SUBCUTANEOUS | Status: AC
  Administered 2021-03-11: 60 mg via SUBCUTANEOUS

## 2021-03-11 NOTE — Progress Notes (Signed)
Patient presented for 6-month Prolia injection SQ to left arm. Patient tolerated well. 

## 2021-05-09 DIAGNOSIS — Z23 Encounter for immunization: Secondary | ICD-10-CM | POA: Diagnosis not present

## 2021-05-30 DIAGNOSIS — Z23 Encounter for immunization: Secondary | ICD-10-CM | POA: Diagnosis not present

## 2021-06-11 ENCOUNTER — Ambulatory Visit (INDEPENDENT_AMBULATORY_CARE_PROVIDER_SITE_OTHER): Payer: Medicare Other | Admitting: Podiatry

## 2021-06-11 ENCOUNTER — Other Ambulatory Visit: Payer: Self-pay

## 2021-06-11 DIAGNOSIS — B351 Tinea unguium: Secondary | ICD-10-CM

## 2021-06-11 DIAGNOSIS — M79674 Pain in right toe(s): Secondary | ICD-10-CM

## 2021-06-11 NOTE — Progress Notes (Signed)
   HPI: 87 y.o. female presenting today presenting to the office today as a new patient for evaluation of pain and tenderness associated to the right toenail.  Patient states that her toenail is thick and very sensitive in close toed shoes.  She presents for further treatment and evaluation  Past Medical History:  Diagnosis Date   Essential hypertension    Hyperlipidemia    Mitral valve prolapse    Moderate to Severe Mitral regurgitation    a. 07/2018 Echo: EF 65-70%. Mild AS. Mild MVP w/ mild MS and mod-sev MR. Sev dil LA. Midly dil RA. PASP 40-43mmHg.   PAF (paroxysmal atrial fibrillation) (HCC)    a. CHA2DS2VASc = 4-->Eliquis 2.5mg  BID; b. 08/2019 Event Monitor: 100% Afib (52-199, avg 96). 4 runs of VT vs afib w/ aberrancy.   PAH (pulmonary artery hypertension) (HCC)    a. 07/2018 Echo: PASP 40-29mmHg in setting of mild MS and mod-sev MR.     Physical Exam: General: The patient is alert and oriented x3 in no acute distress.  Dermatology: Hyperkeratotic dystrophic nail noted to the nail plate of the right third toe  Vascular: Palpable pedal pulses bilaterally. No edema or erythema noted. Capillary refill within normal limits.  Neurological: Epicritic and protective threshold grossly intact bilaterally.   Musculoskeletal Exam: No pedal deformities noted  Assessment: 1.  Onikul dystrophic nail third digit right foot   Plan of Care:  1. Patient evaluated.  2.  Mechanical debridement of the nail plate was performed using a nail nipper without incident or bleeding.  The patient felt immediate relief 3.  Recommend wide fitting shoes that do not constrict the toebox area 4.  Return to clinic as needed      Felecia Shelling, DPM [facility]  Dr. Felecia Shelling, DPM    [address] [place], [place] [address]                Office [phone]  Fax [phone]

## 2021-07-07 ENCOUNTER — Other Ambulatory Visit: Payer: Self-pay | Admitting: Cardiovascular Disease

## 2021-07-22 NOTE — Progress Notes (Signed)
Cardiology Office Note  Date:  07/24/2021   ID:  Kyrsten, Deleeuw 08/30/1926, MRN [id number]  PCP:  [name], MD   Chief Complaint  Patient presents with   6 month follow up     Patient c/o LE edema. Medications reviewed by the patient verbally.     HPI:  87 year old woman with  anterior leaflet MVP and  moderate to severe MR, in 2018 Mild to moderate  aortic valve stenosis Moderate to severe TR, RVSP 54 in 2018 Hyperlipidemia Lives at [facility] who presents for routine followup of her mitral and aortic valve disease, atrial fibrillation   Lives at [facility] 1 mile per day Exercise class 3x a week Still doing it Denies shortness of breath on exertion  Does not check BP at home  No sx of shortness of breath, no edema Sleeping ok, no PND/orthopnea  rare tachycardia palpitation No recent use of lasix, took her self off it, previously doing this 2-3 times a week Mild ankle swelling, worse today than on previous visits  EKG personally reviewed by myself on todays visit NSR rate 57 no significant ST-T wave changes  Of past medical history reviewed event monitor 09/2019 was 100% atrial fib  Echo 02/2020 reviewed with her EF 60% Moderate MR, mild TR Mild AS  March 2021  back in normal sinus rhythm Tolerating Eliquis low-dose with metoprolol, no complaints  Event monitor, February 2021  discussed in detail Rhythm is atrial fibrillation/flutter, 100% burden ranging from 52-199 bpm (avg of 96 bpm). 4 Ventricular Tachycardia (unable to exclude atrial fibrillation with aberrancy) runs occurred, the run with the fastest interval lasting 9 beats with a max rate of 193 bpm, the longest lasting 10 beats with an avg rate of 163 bpm.  No patient triggered Isolated VEs were occasional (1.8%, 32223), VE Couplets were rare (<1.0%, 58), and VE Triplets were rare (<1.0%, 2). Ventricular Bigeminy and Trigeminy were present. Patient triggered events were not associated  with significant arrhythmia  Echo 07/2018 Left ventricle: The cavity size was normal. Wall thickness was   increased in a pattern of mild LVH. Systolic function was   vigorous. The estimated ejection fraction was in the range of 65%   to 70%.  - Aortic valve:  mild stenosis. - Mitral valve:  Mild prolapse. mild stenosis.  moderate to   severe regurgitation directed posteriorly. - Left atrium: The atrium was severely dilated. - Pulmonary arteries: Systolic pressure was moderately increased,   in the range of 40 mm Hg to 45 mm Hg plus central venous/right   atrial pressure.   PMH:   has a past medical history of Essential hypertension, Hyperlipidemia, Mitral valve prolapse, Moderate to Severe Mitral regurgitation, PAF (paroxysmal atrial fibrillation) (HCC), and PAH (pulmonary artery hypertension) (HCC).  PSH:    Past Surgical History:  Procedure Laterality Date   BIOPSY BREAST  1980   left ,  benign   BREAST BIOPSY  1980   normal   CATARACT EXTRACTION W/PHACO Left 05/01/2020   Procedure: CATARACT EXTRACTION PHACO AND INTRAOCULAR LENS PLACEMENT (IOC) LEFT 6.81 00:48.8;  Surgeon: Galen Manila, MD;  Location: [facility];  Service: Ophthalmology;  Laterality: Left;   CATARACT EXTRACTION W/PHACO Right 05/22/2020   Procedure: CATARACT EXTRACTION PHACO AND INTRAOCULAR LENS PLACEMENT (IOC) RIGHT 7.41 00:43.3;  Surgeon: Galen Manila, MD;  Location: [facility];  Service: Ophthalmology;  Laterality: Right;   Hospitalized for endocarditis  1987  Current Outpatient Medications  Medication Sig Dispense Refill   apixaban (ELIQUIS) 2.5 MG TABS tablet Take 1 tablet (2.5 mg total) by mouth 2 (two) times daily. 180 tablet 3   CALCIUM 600 1500 (600 Ca) MG TABS tablet Take by mouth.     Calcium Carbonate-Vit D-Min (CALCIUM 1200 PO) Take 600 mg by mouth.     ezetimibe (ZETIA) 10 MG tablet TAKE 1 TABLET BY MOUTH  DAILY 90 tablet 0   furosemide (LASIX) 20 MG tablet Take 1  tablet (20 mg total) by mouth daily as needed. 30 tablet 2   metoprolol succinate (TOPROL-XL) 50 MG 24 hr tablet Take 1 tablet (50 mg total) by mouth daily. Take with or immediately following a meal. 90 tablet 3   Multiple Vitamin (MULTIVITAMIN) tablet Take 1 tablet by mouth daily.     potassium chloride (KLOR-CON) 10 MEQ tablet TAKE 1 TABLET BY MOUTH  DAILY 90 tablet 3   Current Facility-Administered Medications  Medication Dose Route Frequency Provider Last Rate Last Admin   denosumab (PROLIA) injection 60 mg  60 mg Subcutaneous Q6 months [name], MD   60 mg at 02/27/20 1106     Allergies:   Codeine, Statins, and Sulfonamide derivatives   Social History:  The patient  reports that she has never smoked. She has never used smokeless tobacco. She reports that she does not drink alcohol and does not use drugs.   Family History:   family history includes Cancer in her sister; Coronary artery disease in her father; Heart attack in her father; Heart disease in her brother.    Review of Systems: Review of Systems  Constitutional: Negative.   HENT: Negative.    Respiratory: Negative.    Cardiovascular: Negative.   Gastrointestinal: Negative.   Musculoskeletal: Negative.   Neurological: Negative.   Psychiatric/Behavioral: Negative.    All other systems reviewed and are negative.  PHYSICAL EXAM: VS:  BP (!) 160/70 (BP Location: Left Arm, Patient Position: Sitting, Cuff Size: Normal)   Pulse (!) 59   Ht 4\' 11"  (1.499 m)   Wt 106 lb 6 oz (48.3 kg)   SpO2 98%   BMI 21.49 kg/m  , BMI Body mass index is 21.49 kg/m. Constitutional:  oriented to person, place, and time. No distress.  HENT:  Head: Grossly normal Eyes:  no discharge. No scleral icterus.  Neck: No JVD, no carotid bruits  Cardiovascular: Regular rate and rhythm, no murmurs appreciated Pulmonary/Chest: Clear to auscultation bilaterally, no wheezes or rails Abdominal: Soft.  no distension.  no tenderness.   Musculoskeletal: Normal range of motion Neurological:  normal muscle tone. Coordination normal. No atrophy Skin: Skin warm and dry Psychiatric: normal affect, pleasant  Recent Labs: 02/12/2021: ALT 15; BUN 22; Creatinine, Ser 1.02; Hemoglobin 13.4; Platelets 190.0; Potassium 4.6; Sodium 140    Lipid Panel Lab Results  Component Value Date   CHOL 173 02/12/2021   HDL 53.30 02/12/2021   LDLCALC 105 (H) 02/12/2021   TRIG 73.0 02/12/2021      Wt Readings from Last 3 Encounters:  07/23/21 106 lb 6 oz (48.3 kg)  02/12/21 103 lb (46.7 kg)  01/01/21 104 lb 6 oz (47.3 kg)     ASSESSMENT AND PLAN:  Atrial flutter Maintaining normal sinus rhythm on Eliquis,  metoprolol succinate 50 daily, Arrhythmia jan and feb 2021 Overall stable Long discussion concerning potential switch to Pradaxa once this goes generic  Mixed hyperlipidemia Tolerating Zetia Reasonable cholesterol numbers   Chronic venous insufficiency  recommend compression hose Worsening edema today, recommend Lasix as below, 2-3 times a week  MITRAL REGURGITATION No SOB symptoms on exertion Mitral valve prolapse, known Moderate MR Previous did not want surgery No symptoms  Aortic valve stenosis Mild aortic valve stenosis on recent echocardiogram No sx  Pulmonary HTN With moderate Tricuspid regurg Denies SOB Worsening of her edema, recommended she take Lasix to 3 times a week She has not been taking this   Total encounter time more than 35 minutes  Greater than 50% was spent in counseling and coordination of care with the patient    Orders Placed This Encounter  Procedures   EKG 12-Lead      Signed, Dossie Arbour, M.D., Ph.D. 07/24/2021  [facility] [place], [place] [phone]

## 2021-07-23 ENCOUNTER — Ambulatory Visit (INDEPENDENT_AMBULATORY_CARE_PROVIDER_SITE_OTHER): Payer: Medicare Other | Admitting: Cardiovascular Disease

## 2021-07-23 ENCOUNTER — Other Ambulatory Visit: Payer: Self-pay

## 2021-07-23 ENCOUNTER — Encounter: Payer: Self-pay | Admitting: Cardiovascular Disease

## 2021-07-23 VITALS — BP 160/70 | HR 59 | Ht 59.0 in | Wt 106.4 lb

## 2021-07-23 DIAGNOSIS — I480 Paroxysmal atrial fibrillation: Secondary | ICD-10-CM

## 2021-07-23 DIAGNOSIS — I4892 Unspecified atrial flutter: Secondary | ICD-10-CM | POA: Diagnosis not present

## 2021-07-23 DIAGNOSIS — I272 Pulmonary hypertension, unspecified: Secondary | ICD-10-CM

## 2021-07-23 DIAGNOSIS — I10 Essential (primary) hypertension: Secondary | ICD-10-CM

## 2021-07-23 DIAGNOSIS — I340 Nonrheumatic mitral (valve) insufficiency: Secondary | ICD-10-CM

## 2021-07-23 DIAGNOSIS — E782 Mixed hyperlipidemia: Secondary | ICD-10-CM | POA: Diagnosis not present

## 2021-07-23 DIAGNOSIS — I350 Nonrheumatic aortic (valve) stenosis: Secondary | ICD-10-CM

## 2021-07-23 MED ORDER — FUROSEMIDE 20 MG PO TABS: 20.0000 mg | ORAL_TABLET | Freq: Every day | ORAL | 2 refills | Status: DC | PRN

## 2021-07-23 NOTE — Patient Instructions (Addendum)
Check on the price if pradaxa  (in place of eliquis)  Medication Instructions:  Lasix 20 daily as needed for ankle swelling, shortness of breath Can consider taking twice a week  If you need a refill on your cardiac medications before your next appointment, please call your pharmacy.   Lab work: No new labs needed  Testing/Procedures: No new testing needed  Follow-Up: At [facility], you and your health needs are our priority.  As part of our continuing mission to provide you with exceptional heart care, we have created designated Provider Care Teams.  These Care Teams include your primary Cardiologist (physician) and Advanced Practice Providers (APPs -  Physician Assistants and Nurse Practitioners) who all work together to provide you with the care you need, when you need it.  You will need a follow up appointment in 6 months  Providers on your designated Care Team:   Nicolasa Ducking, NP Eula Listen, PA-C Cadence Fransico Michael, [place]  COVID-19 Vaccine Information can be found at: [email or web address] For questions related to vaccine distribution or appointments, please email [email or web address] or call [phone].

## 2021-07-24 IMAGING — DX DG HAND COMPLETE 3+V*R*
3 series · 3 of 3 positions shown · non-contrast
Comparison: None.

CLINICAL DATA: Severe pain in the right hand

EXAM:
RIGHT HAND - COMPLETE 3+ VIEW

[hand pa]
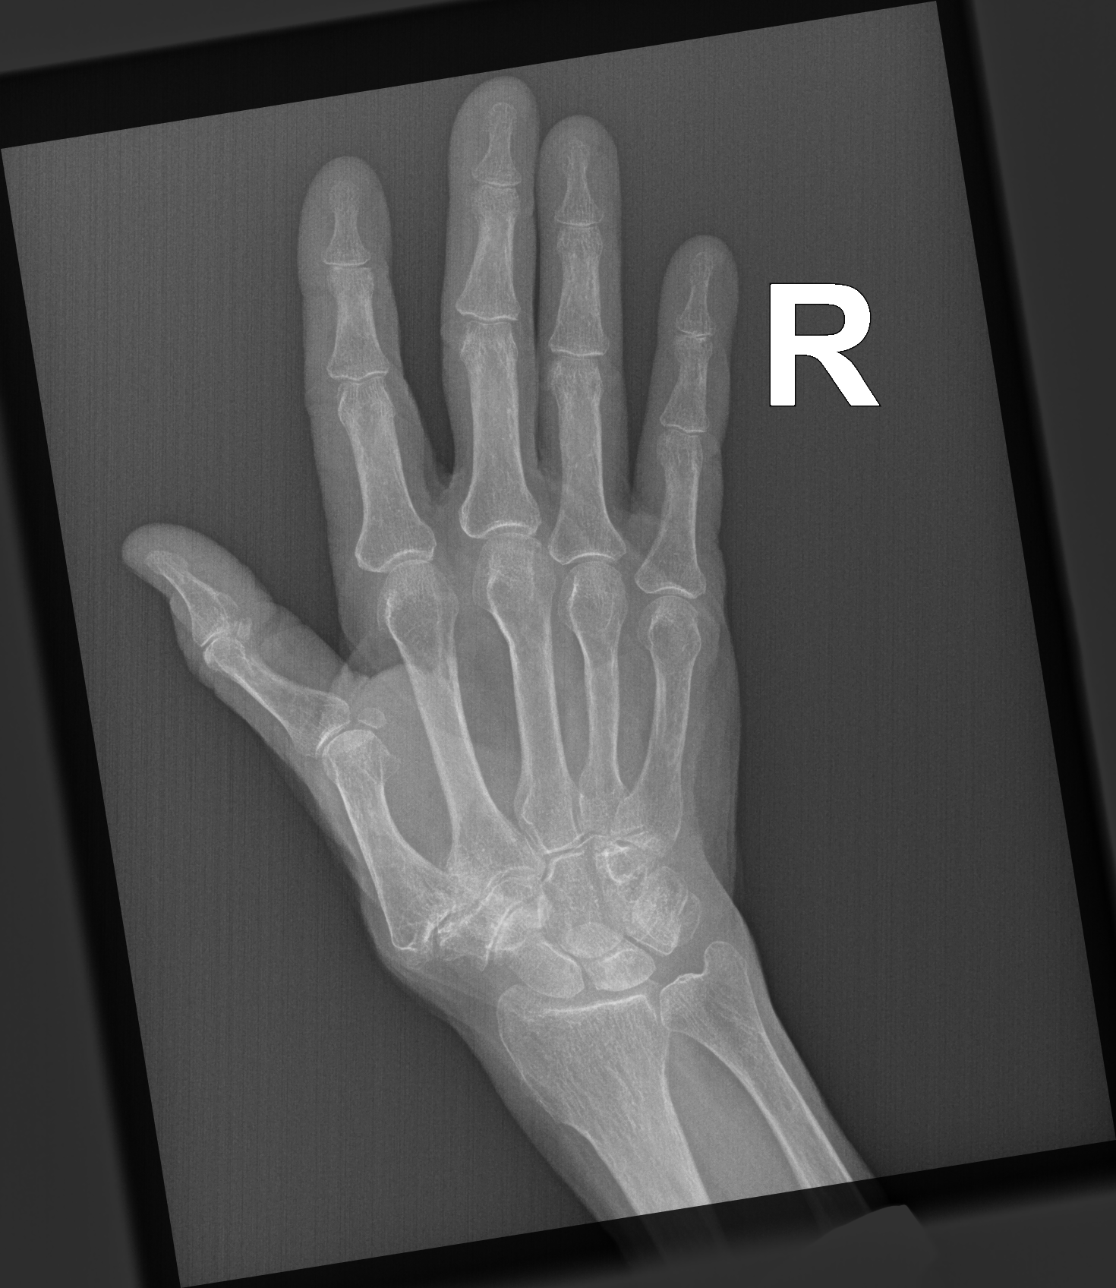

[hand obl (oblique)]
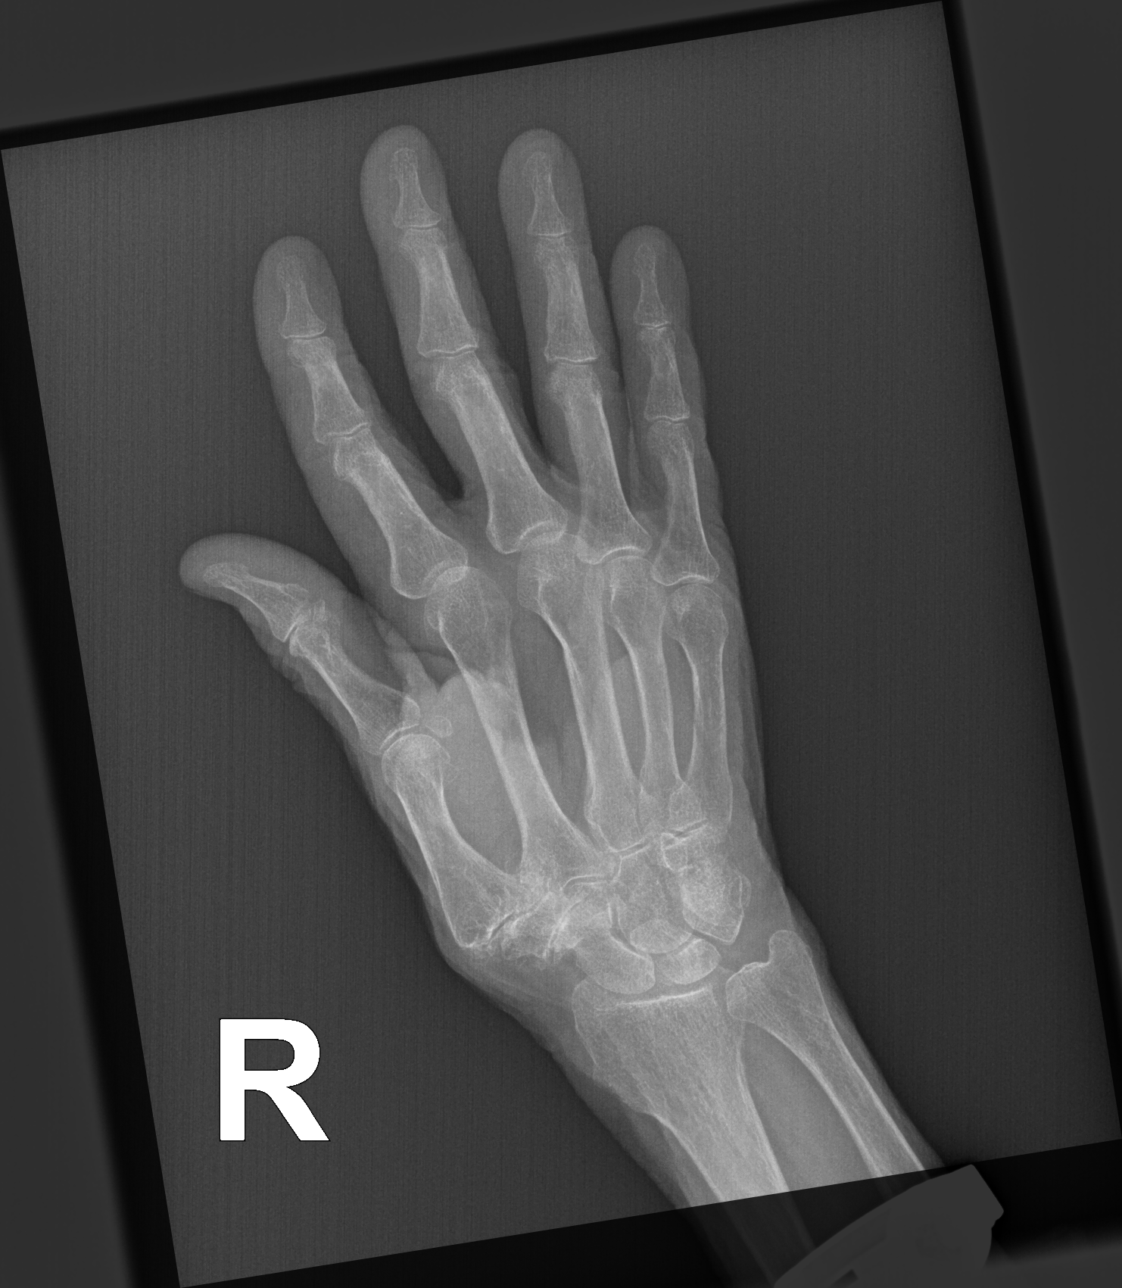

[hand lat]
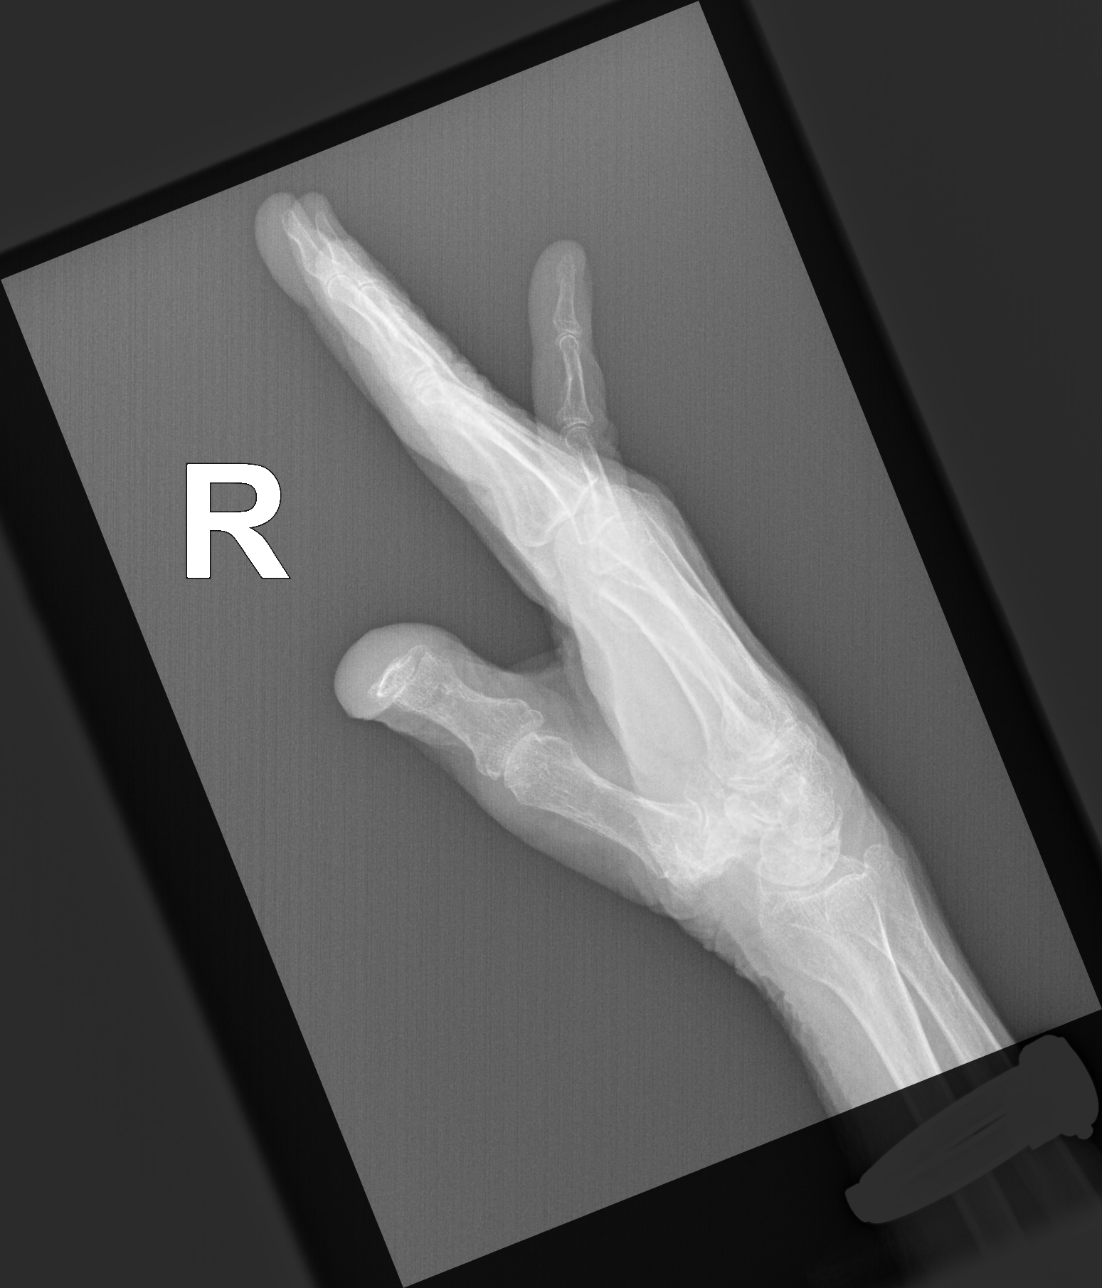

[3 of 3 positions shown; findings below may reference images not displayed]

FINDINGS: Diffuse osteopenia and soft tissue swelling. No fracture or
malalignment. Advanced arthritis at the first CMC joint. Remaining
joint spaces are relatively preserved. No definitive erosive change.
IMPRESSION: Generalized osteopenia and soft tissue swelling. Advanced arthritis
at the first CMC joint.

## 2021-07-31 ENCOUNTER — Other Ambulatory Visit: Payer: Self-pay | Admitting: Cardiovascular Disease

## 2021-07-31 NOTE — Telephone Encounter (Signed)
Eliquis 2.5 mg refill request received. Patient is 87 years old, weight- 48.3 kg, Crea-1.02 on 02/12/21, Diagnosis-PAF, and last seen by Dr. Mariah Milling on 07/23/21. Dose is appropriate based on dosing criteria. Will send in refill to requested pharmacy.

## 2021-07-31 NOTE — Telephone Encounter (Signed)
Please review

## 2021-08-14 ENCOUNTER — Other Ambulatory Visit: Payer: Self-pay

## 2021-08-14 ENCOUNTER — Ambulatory Visit (INDEPENDENT_AMBULATORY_CARE_PROVIDER_SITE_OTHER): Payer: Medicare Other | Admitting: Internal Medicine

## 2021-08-14 ENCOUNTER — Encounter: Payer: Self-pay | Admitting: Internal Medicine

## 2021-08-14 VITALS — BP 148/58 | HR 53 | Temp 97.7°F | Ht 59.0 in | Wt 105.0 lb

## 2021-08-14 DIAGNOSIS — I10 Essential (primary) hypertension: Secondary | ICD-10-CM | POA: Diagnosis not present

## 2021-08-14 DIAGNOSIS — D6869 Other thrombophilia: Secondary | ICD-10-CM

## 2021-08-14 DIAGNOSIS — E559 Vitamin D deficiency, unspecified: Secondary | ICD-10-CM | POA: Diagnosis not present

## 2021-08-14 DIAGNOSIS — M810 Age-related osteoporosis without current pathological fracture: Secondary | ICD-10-CM

## 2021-08-14 DIAGNOSIS — M791 Myalgia, unspecified site: Secondary | ICD-10-CM | POA: Diagnosis not present

## 2021-08-14 DIAGNOSIS — I480 Paroxysmal atrial fibrillation: Secondary | ICD-10-CM

## 2021-08-14 DIAGNOSIS — E782 Mixed hyperlipidemia: Secondary | ICD-10-CM | POA: Diagnosis not present

## 2021-08-14 DIAGNOSIS — T466X5A Adverse effect of antihyperlipidemic and antiarteriosclerotic drugs, initial encounter: Secondary | ICD-10-CM | POA: Diagnosis not present

## 2021-08-14 NOTE — Assessment & Plan Note (Signed)
She is tolerating Eliquis for reduction in embolic stroke risk due to atrial fibrillation . Less costly alternative needed

## 2021-08-14 NOTE — Progress Notes (Signed)
Subjective:  Patient ID: Brandi [name], female    DOB: Oct 05, 1926  Age: 87 y.o. MRN: [id number]  CC: The primary encounter diagnosis was Essential hypertension. Diagnoses of Mixed hyperlipidemia, Myalgia due to statin, Age-related osteoporosis without current pathological fracture, Paroxysmal atrial fibrillation ([place]), Vitamin D deficiency, and Acquired thrombophilia ([place]) were also pertinent to this visit.  HPI Brandi [name] presents for  6 month follow up on atrial fibrillation, hypertension and osteoporosis  Chief Complaint  Patient presents with   Follow-up    6 month follow up     This visit occurred during the SARS-CoV-2 public health emergency.  Safety protocols were in place, including screening questions prior to the visit, additional usage of staff PPE, and extensive cleaning of exam room while observing appropriate contact time as indicated for disinfecting solutions.   Since her last visit she has undergone bilateral cataract removal .  Requires visions for distance.   Osteoporosis:  receiving Prolia every 6 months,  started Sept 2020 .  Has not had DEXA since 2019.  Prior upper extremity fractures noted.  Next injection is due Sep 11 2021.  Vitamin d and calcium needs reviewed Last DEXA was in 2019   Walking a mile daily and participating in a group class 3 days per week   Hypertension: patient checks blood pressure twice weekly at home.  Readings have been for the most part > 140/80 at rest . Patient is following a reduce salt diet most days and is taking medications as prescribed   Reviewed findings of prior CT scan today..  Patient is intolerant of  statin therpay but taking Zetia an d LDL is 105 by June labs.  starting with 20 mg crestor once daily and advance as tolerated to twice weekly.      Acquired thrombophilia: managed with eliquis but having trouble due to cost   Outpatient Medications Prior to Visit  Medication Sig Dispense Refill   apixaban (ELIQUIS) 2.5 MG  TABS tablet TAKE 1 TABLET BY MOUTH  TWICE DAILY 180 tablet 1   CALCIUM 600 1500 (600 Ca) MG TABS tablet Take by mouth.     Calcium Carbonate-Vit D-Min (CALCIUM 1200 PO) Take 600 mg by mouth.     ezetimibe (ZETIA) 10 MG tablet TAKE 1 TABLET BY MOUTH  DAILY 90 tablet 0   furosemide (LASIX) 20 MG tablet Take 1 tablet (20 mg total) by mouth daily as needed. 30 tablet 2   metoprolol succinate (TOPROL-XL) 50 MG 24 hr tablet Take 1 tablet (50 mg total) by mouth daily. Take with or immediately following a meal. 90 tablet 3   Multiple Vitamin (MULTIVITAMIN) tablet Take 1 tablet by mouth daily.     potassium chloride (KLOR-CON) 10 MEQ tablet TAKE 1 TABLET BY MOUTH  DAILY 90 tablet 3   Facility-Administered Medications Prior to Visit  Medication Dose Route Frequency Provider Last Rate Last Admin   denosumab (PROLIA) injection 60 mg  60 mg Subcutaneous Q6 months Crecencio Mc, MD   60 mg at 02/27/20 1106    Review of Systems;  Patient denies headache, fevers, malaise, unintentional weight loss, skin rash, eye pain, sinus congestion and sinus pain, sore throat, dysphagia,  hemoptysis , cough, dyspnea, wheezing, chest pain, palpitations, orthopnea, edema, abdominal pain, nausea, melena, diarrhea, constipation, flank pain, dysuria, hematuria, urinary  Frequency, nocturia, numbness, tingling, seizures,  Focal weakness, Loss of consciousness,  Tremor, insomnia, depression, anxiety, and suicidal ideation.      Objective:  BP [name])  148/58 (BP Location: Left Arm, Patient Position: Sitting, Cuff Size: Normal)    Pulse (!) 53    Temp 97.7 F (36.5 C) (Oral)    Ht 4' 11"  (1.499 m)    Wt 105 lb (47.6 kg)    SpO2 98%    BMI 21.21 kg/m   BP Readings from Last 3 Encounters:  08/14/21 (!) 148/58  07/23/21 (!) 160/70  02/12/21 114/64    Wt Readings from Last 3 Encounters:  08/14/21 105 lb (47.6 kg)  07/23/21 106 lb 6 oz (48.3 kg)  02/12/21 103 lb (46.7 kg)    General appearance: alert, cooperative and  appears stated age Ears: normal TM's and external ear canals both ears Throat: lips, mucosa, and tongue normal; teeth and gums normal Neck: no adenopathy, no carotid bruit, supple, symmetrical, trachea midline and thyroid not enlarged, symmetric, no tenderness/mass/nodules Back: symmetric, no curvature. ROM normal. No CVA tenderness. Lungs: clear to auscultation bilaterally Heart: regular rate and rhythm, S1, S2 normal, no murmur, click, rub or gallop Abdomen: soft, non-tender; bowel sounds normal; no masses,  no organomegaly Pulses: 2+ and symmetric Skin: Skin color, texture, turgor normal. No rashes or lesions Lymph nodes: Cervical, supraclavicular, and axillary nodes normal.  Lab Results  Component Value Date   HGBA1C 5.1 02/17/2020   HGBA1C 5.5 05/22/2017    Lab Results  Component Value Date   CREATININE 1.02 02/12/2021   CREATININE 0.97 08/13/2020   CREATININE 0.97 03/09/2020    Lab Results  Component Value Date   WBC 6.6 02/12/2021   HGB 13.4 02/12/2021   HCT 40.8 02/12/2021   PLT 190.0 02/12/2021   GLUCOSE 81 02/12/2021   CHOL 173 02/12/2021   TRIG 73.0 02/12/2021   HDL 53.30 02/12/2021   LDLDIRECT 91.0 10/31/2016   LDLCALC 105 (H) 02/12/2021   ALT 15 02/12/2021   AST 30 02/12/2021   NA 140 02/12/2021   K 4.6 02/12/2021   CL 105 02/12/2021   CREATININE 1.02 02/12/2021   BUN 22 02/12/2021   CO2 32 02/12/2021   TSH 2.25 03/09/2020   HGBA1C 5.1 02/17/2020    No results found.  Assessment & Plan:   Problem List Items Addressed This Visit     Hyperlipidemia   Relevant Orders   Lipid Profile   Essential hypertension - Primary   Relevant Orders   Comp Met (CMET)   Osteoporosis   Relevant Orders   DG Bone Density   Atrial fibrillation ([place])    Managed with metoprolol.  She has been taking Eliquis for embolic stroke risk mitigation,BUT LOOKING for less costly alternatives covered by [employer] .  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 Less costly alternative needed       Myalgia due to statin    By previous trials.  She is tolerating ezetimibe for management of aortic atherosclerosis       Other Visit Diagnoses     Vitamin D deficiency       Relevant Orders   VITAMIN D 25 Hydroxy (Vit-D Deficiency, Fractures)       I am having Antionette Poles. Barocio maintain her multivitamin, Calcium Carbonate-Vit D-Min (CALCIUM 1200 PO), Calcium 600, metoprolol succinate, potassium chloride, ezetimibe, furosemide, and Eliquis.  We will continue to administer denosumab.  No orders of the defined types were placed in this encounter.    I provided  30 minutes of  face-to-face time during this encounter reviewing patient's current problems and past surgeries, labs and imaging studies, providing counseling on the above mentioned problems , and coordination  of care .   Follow-up: No follow-ups on file.   Crecencio Mc, MD

## 2021-08-14 NOTE — Assessment & Plan Note (Addendum)
Managed with metoprolol.  She has been taking Eliquis for embolic stroke risk mitigation,BUT LOOKING for less costly alternatives covered by [employer] .  She has myxomatous mitral valve and mild aortic stenosis with normal EF by July 2021 ECHO

## 2021-08-14 NOTE — Assessment & Plan Note (Addendum)
By previous trials.  She is tolerating ezetimibe for management of aortic atherosclerosis

## 2021-08-15 ENCOUNTER — Telehealth: Payer: Self-pay | Admitting: *Deleted

## 2021-08-15 LAB — COMPREHENSIVE METABOLIC PANEL
ALT: 17 U/L (ref 0–35)
AST: 30 U/L (ref 0–37)
Albumin: 3.5 g/dL (ref 3.5–5.2)
Alkaline Phosphatase: 64 U/L (ref 39–117)
BUN: 22 mg/dL (ref 6–23)
CO2: 33 mEq/L — ABNORMAL HIGH (ref 19–32)
Calcium: 9.2 mg/dL (ref 8.4–10.5)
Chloride: 104 mEq/L (ref 96–112)
Creatinine, Ser: 1.05 mg/dL (ref 0.40–1.20)
GFR: 46.29 mL/min — ABNORMAL LOW (ref >60.00)
Glucose, Bld: 110 mg/dL — ABNORMAL HIGH (ref 70–99)
Potassium: 4.3 mEq/L (ref 3.5–5.1)
Sodium: 140 mEq/L (ref 135–145)
Total Bilirubin: 0.6 mg/dL (ref 0.2–1.2)
Total Protein: 6 g/dL (ref 6.0–8.3)

## 2021-08-15 LAB — LIPID PANEL
Cholesterol: 165 mg/dL (ref 0–200)
HDL: 46.6 mg/dL (ref >39.00)
LDL Cholesterol: 87 mg/dL (ref 0–99)
NonHDL: 118.88
Total CHOL/HDL Ratio: 4
Triglycerides: 160 mg/dL — ABNORMAL HIGH (ref 0.0–149.0)
VLDL: 32 mg/dL (ref 0.0–40.0)

## 2021-08-15 LAB — VITAMIN D 25 HYDROXY (VIT D DEFICIENCY, FRACTURES): VITD: 71.37 ng/mL (ref 30.00–100.00)

## 2021-08-15 NOTE — Chronic Care Management (AMB) (Signed)
Chronic Care Management   Note  08/15/2021 Name: Brandi [name] MRN: [id number] DOB: 25-Oct-1926  Brandi [name] is a 87 y.o. year old female who is a primary care patient of Derrel Nip, Aris Everts, MD. I reached out to Caralee Ates by phone today in response to a referral sent by [name]'s PCP.  [name] was given information about Chronic Care Management services today including:  CCM service includes personalized support from designated clinical staff supervised by her physician, including individualized plan of care and coordination with other care providers 24/7 contact phone numbers for assistance for urgent and routine care needs. Service will only be billed when office clinical staff spend 20 minutes or more in a month to coordinate care. Only one practitioner may furnish and bill the service in a calendar month. The patient may stop CCM services at any time (effective at the end of the month) by phone call to the office staff. The patient is responsible for co-pay (up to 20% after annual deductible is met) if co-pay is required by the individual health plan.   Patient did not agree to enrollment in care management services and does not wish to consider at this time.  Follow up plan: Patient declines engagement by the care management team. Appropriate care team members and provider have been notified via electronic communication. The care management team is available to follow up with the patient after provider conversation with the patient regarding recommendation for care management engagement and subsequent re-referral to the care management team.   [place] [facility]  Direct Dial: [phone]

## 2021-08-20 ENCOUNTER — Telehealth: Payer: Self-pay | Admitting: Internal Medicine

## 2021-08-20 NOTE — Telephone Encounter (Signed)
Spoke with pt and gave her the number to call to get her bone density scan scheduled. Pt gave a verbal understanding.

## 2021-08-20 NOTE — Telephone Encounter (Signed)
Patient Called in stated she is requesting a Bone density Test. Please contact Patient  at [phone]

## 2021-09-04 ENCOUNTER — Other Ambulatory Visit: Payer: Self-pay | Admitting: Cardiovascular Disease

## 2021-09-18 ENCOUNTER — Ambulatory Visit (INDEPENDENT_AMBULATORY_CARE_PROVIDER_SITE_OTHER): Payer: Medicare Other

## 2021-09-18 VITALS — Ht 59.0 in | Wt 105.0 lb

## 2021-09-18 DIAGNOSIS — Z0000 Encounter for general adult medical examination without abnormal findings: Secondary | ICD-10-CM

## 2021-09-18 NOTE — Patient Instructions (Addendum)
Brandi [name] , Thank you for taking time to come for your Medicare Wellness Visit. I appreciate your ongoing commitment to your health goals. Please review the following plan we discussed and let me know if I can assist you in the future.   These are the goals we discussed:  Goals      Maintain Healthy Lifestyle     Stay active with walking Healthy diet Stay hydrated Continue with brain engaging activities        This is a list of the screening recommended for you and due dates:  Health Maintenance  Topic Date Due   Urine Protein Check  Never done   COVID-19 Vaccine (4 - Booster for Moderna series) 10/04/2021*   Tetanus Vaccine  09/18/2022*   Eye exam for diabetics  12/16/2021   Pneumonia Vaccine  Completed   Flu Shot  Completed   DEXA scan (bone density measurement)  Completed   Zoster (Shingles) Vaccine  Completed   HPV Vaccine  Aged Out  *Topic was postponed. The date shown is not the original due date.    Advanced directives: on file  Conditions/risks identified: none new  Follow up in one year for your annual wellness visit    Preventive Care 65 Years and Older, Female Preventive care refers to lifestyle choices and visits with your health care provider that can promote health and wellness. What does preventive care include? A yearly physical exam. This is also called an annual well check. Dental exams once or twice a year. Routine eye exams. Ask your health care provider how often you should have your eyes checked. Personal lifestyle choices, including: Daily care of your teeth and gums. Regular physical activity. Eating a healthy diet. Avoiding tobacco and drug use. Limiting alcohol use. Practicing safe sex. Taking low-dose aspirin every day. Taking vitamin and mineral supplements as recommended by your health care provider. What happens during an annual well check? The services and screenings done by your health care provider during your annual well check  will depend on your age, overall health, lifestyle risk factors, and family history of disease. Counseling  Your health care provider may ask you questions about your: Alcohol use. Tobacco use. Drug use. Emotional well-being. Home and relationship well-being. Sexual activity. Eating habits. History of falls. Memory and ability to understand (cognition). Work and work Astronomer. Reproductive health. Screening  You may have the following tests or measurements: Height, weight, and BMI. Blood pressure. Lipid and cholesterol levels. These may be checked every 5 years, or more frequently if you are over 35 years old. Skin check. Lung cancer screening. You may have this screening every year starting at age 64 if you have a 30-pack-year history of smoking and currently smoke or have quit within the past 15 years. Fecal occult blood test (FOBT) of the stool. You may have this test every year starting at age 33. Flexible sigmoidoscopy or colonoscopy. You may have a sigmoidoscopy every 5 years or a colonoscopy every 10 years starting at age 40. Hepatitis C blood test. Hepatitis B blood test. Sexually transmitted disease (STD) testing. Diabetes screening. This is done by checking your blood sugar (glucose) after you have not eaten for a while (fasting). You may have this done every 1-3 years. Bone density scan. This is done to screen for osteoporosis. You may have this done starting at age 69. Mammogram. This may be done every 1-2 years. Talk to your health care provider about how often you should have regular mammograms.  Talk with your health care provider about your test results, treatment options, and if necessary, the need for more tests. Vaccines  Your health care provider may recommend certain vaccines, such as: Influenza vaccine. This is recommended every year. Tetanus, diphtheria, and acellular pertussis (Tdap, Td) vaccine. You may need a Td booster every 10 years. Zoster vaccine. You  may need this after age 53. Pneumococcal 13-valent conjugate (PCV13) vaccine. One dose is recommended after age 23. Pneumococcal polysaccharide (PPSV23) vaccine. One dose is recommended after age 69. Talk to your health care provider about which screenings and vaccines you need and how often you need them. This information is not intended to replace advice given to you by your health care provider. Make sure you discuss any questions you have with your health care provider. Document Released: 08/31/2015 Document Revised: 04/23/2016 Document Reviewed: 06/05/2015 Elsevier Interactive Patient Education  2017 [place] Prevention in the Home Falls can cause injuries. They can happen to people of all ages. There are many things you can do to make your home safe and to help prevent falls. What can I do on the outside of my home? Regularly fix the edges of walkways and driveways and fix any cracks. Remove anything that might make you trip as you walk through a door, such as a raised step or threshold. Trim any bushes or trees on the path to your home. Use bright outdoor lighting. Clear any walking paths of anything that might make someone trip, such as rocks or tools. Regularly check to see if handrails are loose or broken. Make sure that both sides of any steps have handrails. Any raised decks and porches should have guardrails on the edges. Have any leaves, snow, or ice cleared regularly. Use sand or salt on walking paths during winter. Clean up any spills in your garage right away. This includes oil or grease spills. What can I do in the bathroom? Use night lights. Install grab bars by the toilet and in the tub and shower. Do not use towel bars as grab bars. Use non-skid mats or decals in the tub or shower. If you need to sit down in the shower, use a plastic, non-slip stool. Keep the floor dry. Clean up any water that spills on the floor as soon as it happens. Remove soap buildup  in the tub or shower regularly. Attach bath mats securely with double-sided non-slip rug tape. Do not have throw rugs and other things on the floor that can make you trip. What can I do in the bedroom? Use night lights. Make sure that you have a light by your bed that is easy to reach. Do not use any sheets or blankets that are too big for your bed. They should not hang down onto the floor. Have a firm chair that has side arms. You can use this for support while you get dressed. Do not have throw rugs and other things on the floor that can make you trip. What can I do in the kitchen? Clean up any spills right away. Avoid walking on wet floors. Keep items that you use a lot in easy-to-reach places. If you need to reach something above you, use a strong step stool that has a grab bar. Keep electrical cords out of the way. Do not use floor polish or wax that makes floors slippery. If you must use wax, use non-skid floor wax. Do not have throw rugs and other things on the floor that can make  you trip. What can I do with my stairs? Do not leave any items on the stairs. Make sure that there are handrails on both sides of the stairs and use them. Fix handrails that are broken or loose. Make sure that handrails are as long as the stairways. Check any carpeting to make sure that it is firmly attached to the stairs. Fix any carpet that is loose or worn. Avoid having throw rugs at the top or bottom of the stairs. If you do have throw rugs, attach them to the floor with carpet tape. Make sure that you have a light switch at the top of the stairs and the bottom of the stairs. If you do not have them, ask someone to add them for you. What else can I do to help prevent falls? Wear shoes that: Do not have high heels. Have rubber bottoms. Are comfortable and fit you well. Are closed at the toe. Do not wear sandals. If you use a stepladder: Make sure that it is fully opened. Do not climb a closed  stepladder. Make sure that both sides of the stepladder are locked into place. Ask someone to hold it for you, if possible. Clearly mark and make sure that you can see: Any grab bars or handrails. First and last steps. Where the edge of each step is. Use tools that help you move around (mobility aids) if they are needed. These include: Canes. Walkers. Scooters. Crutches. Turn on the lights when you go into a dark area. Replace any light bulbs as soon as they burn out. Set up your furniture so you have a clear path. Avoid moving your furniture around. If any of your floors are uneven, fix them. If there are any pets around you, be aware of where they are. Review your medicines with your doctor. Some medicines can make you feel dizzy. This can increase your chance of falling. Ask your doctor what other things that you can do to help prevent falls. This information is not intended to replace advice given to you by your health care provider. Make sure you discuss any questions you have with your health care provider. Document Released: 05/31/2009 Document Revised: 01/10/2016 Document Reviewed: 09/08/2014 Elsevier Interactive Patient Education  2017 [employer].

## 2021-09-18 NOTE — Progress Notes (Addendum)
Subjective:   Brandi [name] is a 88 y.o. female who presents for Medicare Annual (Subsequent) preventive examination.  Review of Systems    No ROS.  Medicare Wellness Virtual Visit.  Visual/audio telehealth visit, UTA vital signs.   See social history for additional risk factors.   Cardiac Risk Factors include: advanced age (>86men, >13 women)     Objective:    Today's Vitals   09/18/21 1017  Weight: 105 lb (47.6 kg)  Height: 4\' 11"  (1.499 m)   Body mass index is 21.21 kg/m.  Advanced Directives 09/18/2021 09/17/2020 05/22/2020 05/01/2020 02/16/2020 02/16/2020 09/15/2019  Does Patient Have a Medical Advance Directive? Yes Yes Yes Yes Yes No Yes  Type of Estate agent of [place];Living will Healthcare Power of [place];Living will Healthcare Power of [place];Living will Healthcare Power of [place];Living will Out of facility DNR (pink MOST or yellow form) - Healthcare Power of [place];Living will  Does patient want to make changes to medical advance directive? No - Patient declined No - Patient declined No - Patient declined No - Patient declined No - Patient declined - No - Patient declined  Copy of Healthcare Power of Attorney in Chart? Yes - validated most recent copy scanned in chart (See row information) Yes - validated most recent copy scanned in chart (See row information) No - copy requested No - copy requested - - No - copy requested  Would patient like information on creating a medical advance directive? - - - - No - Patient declined No - Guardian declined -  Pre-existing out of facility DNR order (yellow form or pink MOST form) - - - - Physician notified to receive inpatient order - -    Current Medications (verified) Outpatient Encounter Medications as of 09/18/2021  Medication Sig   apixaban (ELIQUIS) 2.5 MG TABS tablet TAKE 1 TABLET BY MOUTH  TWICE DAILY   CALCIUM 600 1500 (600 Ca) MG TABS tablet Take by mouth.   Calcium Carbonate-Vit D-Min (CALCIUM  1200 PO) Take 600 mg by mouth.   ezetimibe (ZETIA) 10 MG tablet TAKE 1 TABLET BY MOUTH  DAILY   furosemide (LASIX) 20 MG tablet Take 1 tablet (20 mg total) by mouth daily as needed.   metoprolol succinate (TOPROL-XL) 50 MG 24 hr tablet TAKE 1 TABLET BY MOUTH  DAILY WITH OR IMMEDIATELY  FOLLOWING A MEAL   Multiple Vitamin (MULTIVITAMIN) tablet Take 1 tablet by mouth daily.   potassium chloride (KLOR-CON) 10 MEQ tablet TAKE 1 TABLET BY MOUTH  DAILY   Facility-Administered Encounter Medications as of 09/18/2021  Medication   denosumab (PROLIA) injection 60 mg    Allergies (verified) Codeine, Statins, and Sulfonamide derivatives   History: Past Medical History:  Diagnosis Date   Essential hypertension    Hyperlipidemia    Mitral valve prolapse    Moderate to Severe Mitral regurgitation    a. 07/2018 Echo: EF 65-70%. Mild AS. Mild MVP w/ mild MS and mod-sev MR. Sev dil LA. Midly dil RA. PASP 40-61mmHg.   PAF (paroxysmal atrial fibrillation) (HCC)    a. CHA2DS2VASc = 4-->Eliquis 2.5mg  BID; b. 08/2019 Event Monitor: 100% Afib (52-199, avg 96). 4 runs of VT vs afib w/ aberrancy.   PAH (pulmonary artery hypertension) (HCC)    a. 07/2018 Echo: PASP 40-26mmHg in setting of mild MS and mod-sev MR.   Past Surgical History:  Procedure Laterality Date   BIOPSY BREAST  1980   left ,  benign   BREAST BIOPSY  1980  normal   CATARACT EXTRACTION W/PHACO Left 05/01/2020   Procedure: CATARACT EXTRACTION PHACO AND INTRAOCULAR LENS PLACEMENT (IOC) LEFT 6.81 00:48.8;  Surgeon: Birder Robson, MD;  Location: [facility];  Service: Ophthalmology;  Laterality: Left;   CATARACT EXTRACTION W/PHACO Right 05/22/2020   Procedure: CATARACT EXTRACTION PHACO AND INTRAOCULAR LENS PLACEMENT (IOC) RIGHT 7.41 00:43.3;  Surgeon: Birder Robson, MD;  Location: [facility];  Service: Ophthalmology;  Laterality: Right;   Hospitalized for endocarditis  1987   Family History  Problem Relation Age of  Onset   Heart attack Father    Coronary artery disease Father    Cancer Sister        esophageal   Heart disease Brother    Social History   Socioeconomic History   Marital status: Widowed    Spouse name: Not on file   Number of children: 0   Years of education: Not on file   Highest education level: Not on file  Occupational History   Occupation: retired  Tobacco Use   Smoking status: Never   Smokeless tobacco: Never  Vaping Use   Vaping Use: Never used  Substance and Sexual Activity   Alcohol use: No   Drug use: No   Sexual activity: Not Currently  Other Topics Concern   Not on file  Social History Narrative   Lives at [facility]   Exercise, walks 7 days a week with aerobic exercise 2 days a week      Social Determinants of Health   Financial Resource Strain: Low Risk    Difficulty of Paying Living Expenses: Not hard at all  Food Insecurity: No Food Insecurity   Worried About Charity fundraiser in the Last Year: Never true   Arboriculturist in the Last Year: Never true  Transportation Needs: No Transportation Needs   Lack of Transportation (Medical): No   Lack of Transportation (Non-Medical): No  Physical Activity: Sufficiently Active   Days of Exercise per Week: 3 days   Minutes of Exercise per Session: 60 min  Stress: No Stress Concern Present   Feeling of Stress : Not at all  Social Connections: Unknown   Frequency of Communication with Friends and Family: More than three times a week   Frequency of Social Gatherings with Friends and Family: Once a week   Attends Religious Services: Not on Electrical engineer or Organizations: Yes   Attends Archivist Meetings: Not on file   Marital Status: Widowed   Tobacco Counseling Counseling given: Not Answered   Clinical Intake:  Pre-visit preparation completed: Yes        Diabetes: No  How often do you need to have someone help you when you read instructions, pamphlets, or other  written materials from your doctor or pharmacy?: 1 - Never    Interpreter Needed?: No    Activities of Daily Living In your present state of health, do you have any difficulty performing the following activities: 09/18/2021  Hearing? Y  Comment Hearing aids  Vision? N  Difficulty concentrating or making decisions? N  Walking or climbing stairs? N  Dressing or bathing? N  Doing errands, shopping? N  Preparing Food and eating ? N  Using the Toilet? N  In the past six months, have you accidently leaked urine? N  Do you have problems with loss of bowel control? N  Managing your Medications? N  Managing your Finances? N  Housekeeping or managing your  Housekeeping? N  Some recent data might be hidden   Patient Care Team: Crecencio Mc, MD as PCP - General (Internal Medicine) Minna Merritts, MD as PCP - Cardiology (Cardiology) Minna Merritts, MD as Consulting Physician (Cardiology)  Indicate any recent Medical Services you may have received from other than [facility] providers in the past year (date may be approximate).     Assessment:   This is a routine wellness examination for Brandi [name].  Virtual Visit via Telephone Note  I connected with  Brandi [name] on 09/18/21 at 10:30 AM EST by telephone and verified that I am speaking with the correct person using two identifiers.  Persons participating in the virtual visit: patient/Nurse Health Advisor   I discussed the limitations, risks, security and privacy concerns of performing an evaluation and management service by telephone and the availability of in person appointments. The patient expressed understanding and agreed to proceed.  Interactive audio and video telecommunications were attempted between this nurse and patient, however failed, due to patient having technical difficulties OR patient did not have access to video capability.  We continued and completed visit with audio only.  Some vital signs may be absent or patient  reported.   Hearing/Vision screen Hearing Screening - Comments:: Hearing aid, bilateral  Vision Screening - Comments:: Followed by [facility] corrective lenses  Cataract extracted, bilateral   Dietary issues and exercise activities discussed: Current Exercise Habits: Home exercise routine, Type of exercise: calisthenics;stretching, Time (Minutes): 60, Frequency (Times/Week): 3, Weekly Exercise (Minutes/Week): 180, Intensity: Mild Healthy diet Good water intake   Goals Addressed             This Visit's Progress    Maintain Healthy Lifestyle   On track    Stay active with walking Healthy diet Stay hydrated Continue with brain engaging activities       Depression Screen PHQ 2/9 Scores 09/18/2021 08/14/2021 09/17/2020 09/15/2019 09/10/2018 05/05/2018 09/09/2017  PHQ - 2 Score 0 0 0 0 0 0 0    Fall Risk Fall Risk  09/18/2021 08/14/2021 02/12/2021 09/17/2020 08/13/2020  Falls in the past year? 0 0 0 0 0  Number falls in past yr: 0 - - 0 -  Injury with Fall? - - - - -  Risk for fall due to : - No Fall Risks - - -  Follow up Falls evaluation completed Falls evaluation completed Falls evaluation completed Falls evaluation completed Falls evaluation completed   [place]: Home free of loose throw rugs in walkways, pet beds, electrical cords, etc? Yes  Adequate lighting in your home to reduce risk of falls? Yes   ASSISTIVE DEVICES UTILIZED TO PREVENT FALLS: Life alert? No   Use of a cane, walker or w/c? No  Grab bars in the bathroom? Yes  Shower chair or bench in shower? Yes  Elevated toilet seat or a handicapped toilet? No   TIMED UP AND GO: Was the test performed? No .   Cognitive Function: Patient is alert and oriented x3.  Lives alone.  Socially interactive on a regular basis.  MMSE - Mini Mental State Exam 09/05/2016 09/06/2015  Orientation to time 5 5  Orientation to Place 5 5  Registration 3 3  Attention/ Calculation 5 5   Recall 3 3  Language- name 2 objects 2 2  Language- repeat 1 1  Language- follow 3 step command 3 3  Language- read & follow direction 1 1  Write a  sentence 1 1  Copy design 1 1  Total score 30 30     6CIT Screen 09/18/2021 09/17/2020 09/15/2019 09/10/2018 09/09/2017  What Year? 0 points 0 points 0 points 0 points 0 points  What month? 0 points 0 points 0 points 0 points 0 points  What time? 0 points 0 points 0 points 0 points 0 points  Count back from 20 0 points - 0 points 0 points 0 points  Months in reverse 0 points 2 points 0 points 0 points 0 points  Repeat phrase - - - 0 points -  Total Score - - - 0 -   Immunizations Immunization History  Administered Date(s) Administered   19-influenza Whole 06/11/2017   Fluad Quad(high Dose 65+) 05/10/2019, 05/08/2020   H1N1 10/12/2008   Influenza Split 06/17/2011   Influenza Whole 06/18/2008, 06/18/2009, 05/18/2010, 06/17/2010   Influenza-Unspecified 05/18/2013, 05/23/2014, 06/18/2015, 06/03/2018, 05/15/2021   Moderna Sars-Covid-2 Vaccination 09/01/2019, 09/29/2019, 07/03/2020   Pneumococcal Conjugate-13 08/21/2014   Pneumococcal Polysaccharide-23 08/18/1993, 07/28/2013   Td 08/18/2005   Tdap 12/27/2010   Zoster Recombinat (Shingrix) 09/21/2017, 06/16/2018   Zoster, Live 08/02/2010    TDAP status: Due, Education has been provided regarding the importance of this vaccine. Advised may receive this vaccine at local pharmacy or Health Dept. Aware to provide a copy of the vaccination record if obtained from local pharmacy or Health Dept. Verbalized acceptance and understanding. Deferred.   Screening Tests Health Maintenance  Topic Date Due   URINE MICROALBUMIN  Never done   COVID-19 Vaccine (4 - Booster for Moderna series) 10/04/2021 (Originally 08/28/2020)   TETANUS/TDAP  09/18/2022 (Originally 12/26/2020)   OPHTHALMOLOGY EXAM  12/16/2021   Pneumonia Vaccine 51+ Years old  Completed   INFLUENZA VACCINE  Completed   DEXA SCAN   Completed   Zoster Vaccines- Shingrix  Completed   HPV VACCINES  Aged Out   Health Maintenance Health Maintenance Due  Topic Date Due   URINE MICROALBUMIN  Never done   Lung Cancer Screening: (Low Dose CT Chest recommended if Age 76-80 years, 30 pack-year currently smoking OR have quit w/in 15years.) does not qualify.   Hepatitis C Screening: does not qualify  Vision Screening: Recommended annual ophthalmology exams for early detection of glaucoma and other disorders of the eye.  Dental Screening: Recommended annual dental exams for proper oral hygiene  Community Resource Referral / Chronic Care Management: CRR required this visit?  No   CCM required this visit?  No      Plan:   Keep all routine maintenance appointments.   I have personally reviewed and noted the following in the patients chart:   Medical and social history Use of alcohol, tobacco or illicit drugs  Current medications and supplements including opioid prescriptions. Not taking opioid.  Functional ability and status Nutritional status Physical activity Advanced directives List of other physicians Hospitalizations, surgeries, and ER visits in previous 12 months Vitals Screenings to include cognitive, depression, and falls Referrals and appointments  In addition, I have reviewed and discussed with patient certain preventive protocols, quality metrics, and best practice recommendations. A written personalized care plan for preventive services as well as general preventive health recommendations were provided to patient.     OBrien-Blaney, Mckenley Birenbaum L, LPN   [id number]    I have reviewed the above information and agree with above.   Deborra Medina, MD

## 2021-10-03 ENCOUNTER — Telehealth: Payer: Self-pay | Admitting: Internal Medicine

## 2021-10-03 NOTE — Telephone Encounter (Signed)
Patient scheduled for new Bone density on 10/07/2021 okay to give Prolia or should we wait until after 10/07/2021 bone density?

## 2021-10-07 ENCOUNTER — Ambulatory Visit
Admission: RE | Admit: 2021-10-07 | Discharge: 2021-10-07 | Disposition: A | Discharge status: Home / Self Care | Payer: Medicare Other | Source: Ambulatory Visit | Attending: Internal Medicine | Admitting: Internal Medicine

## 2021-10-07 ENCOUNTER — Other Ambulatory Visit: Payer: Self-pay

## 2021-10-07 DIAGNOSIS — Z780 Asymptomatic menopausal state: Secondary | ICD-10-CM | POA: Diagnosis not present

## 2021-10-07 DIAGNOSIS — M810 Age-related osteoporosis without current pathological fracture: Secondary | ICD-10-CM | POA: Diagnosis not present

## 2021-10-08 ENCOUNTER — Other Ambulatory Visit: Payer: Self-pay | Admitting: Cardiovascular Disease

## 2021-10-21 ENCOUNTER — Telehealth: Payer: Self-pay | Admitting: Internal Medicine

## 2021-10-21 NOTE — Telephone Encounter (Signed)
Spoke with pt and informed her of her dexa scan results. Pt gave a verbal understanding.  

## 2021-10-21 NOTE — Telephone Encounter (Signed)
Spoke with pt and scheduled her nurse visit.  ?

## 2021-10-21 NOTE — Telephone Encounter (Signed)
Patient called and said she was returning office phone call for lab results. ?

## 2021-10-21 NOTE — Telephone Encounter (Signed)
Patient due for Prolia please schedule nurse visit. ?

## 2021-10-24 ENCOUNTER — Other Ambulatory Visit: Payer: Self-pay

## 2021-10-24 ENCOUNTER — Ambulatory Visit (INDEPENDENT_AMBULATORY_CARE_PROVIDER_SITE_OTHER): Payer: Medicare Other

## 2021-10-24 DIAGNOSIS — M810 Age-related osteoporosis without current pathological fracture: Secondary | ICD-10-CM

## 2021-10-24 MED ORDER — DENOSUMAB 60 MG/ML ~~LOC~~ SOSY
60.0000 mg | PREFILLED_SYRINGE | Freq: Once | SUBCUTANEOUS | Status: AC
  Administered 2021-10-24: 12:00:00 60 mg via SUBCUTANEOUS

## 2021-10-24 NOTE — Progress Notes (Signed)
Patient came in today for Prolia injection given in left arm SQ. Patient tolerated well with no signs of distress.  °

## 2021-10-26 ENCOUNTER — Other Ambulatory Visit: Payer: Self-pay | Admitting: Cardiovascular Disease

## 2021-12-05 ENCOUNTER — Ambulatory Visit
Admission: RE | Admit: 2021-12-05 | Discharge: 2021-12-05 | Disposition: A | Discharge status: Home / Self Care | Payer: Medicare Other | Source: Ambulatory Visit | Attending: Internal Medicine | Admitting: Internal Medicine

## 2021-12-05 ENCOUNTER — Ambulatory Visit (INDEPENDENT_AMBULATORY_CARE_PROVIDER_SITE_OTHER): Payer: Medicare Other | Admitting: Internal Medicine

## 2021-12-05 ENCOUNTER — Telehealth: Payer: Self-pay | Admitting: Internal Medicine

## 2021-12-05 ENCOUNTER — Encounter: Payer: Self-pay | Admitting: Internal Medicine

## 2021-12-05 VITALS — BP 110/68 | HR 71 | Temp 97.6°F | Resp 14 | Ht 59.0 in | Wt 99.2 lb

## 2021-12-05 DIAGNOSIS — Z7901 Long term (current) use of anticoagulants: Secondary | ICD-10-CM | POA: Diagnosis not present

## 2021-12-05 DIAGNOSIS — J329 Chronic sinusitis, unspecified: Secondary | ICD-10-CM | POA: Diagnosis not present

## 2021-12-05 DIAGNOSIS — S32000D Wedge compression fracture of unspecified lumbar vertebra, subsequent encounter for fracture with routine healing: Secondary | ICD-10-CM

## 2021-12-05 DIAGNOSIS — W19XXXA Unspecified fall, initial encounter: Secondary | ICD-10-CM | POA: Diagnosis not present

## 2021-12-05 DIAGNOSIS — M549 Dorsalgia, unspecified: Secondary | ICD-10-CM

## 2021-12-05 DIAGNOSIS — S22000A Wedge compression fracture of unspecified thoracic vertebra, initial encounter for closed fracture: Secondary | ICD-10-CM

## 2021-12-05 DIAGNOSIS — G319 Degenerative disease of nervous system, unspecified: Secondary | ICD-10-CM | POA: Diagnosis not present

## 2021-12-05 DIAGNOSIS — S0990XA Unspecified injury of head, initial encounter: Secondary | ICD-10-CM | POA: Insufficient documentation

## 2021-12-05 DIAGNOSIS — M545 Low back pain, unspecified: Secondary | ICD-10-CM

## 2021-12-05 DIAGNOSIS — I6782 Cerebral ischemia: Secondary | ICD-10-CM | POA: Diagnosis not present

## 2021-12-05 DIAGNOSIS — S32000A Wedge compression fracture of unspecified lumbar vertebra, initial encounter for closed fracture: Secondary | ICD-10-CM | POA: Diagnosis not present

## 2021-12-05 DIAGNOSIS — X58XXXA Exposure to other specified factors, initial encounter: Secondary | ICD-10-CM | POA: Insufficient documentation

## 2021-12-05 NOTE — Progress Notes (Signed)
Chief Complaint  ?Patient presents with  ? Back Pain  ?  Fell 2 days ago, bumped into garage door hands were full, head hit concrete no damages. Pt states pain is worse in the morning, treating with advil with relief.   ? ?Acute visit  ?Monday working outdoors at [facility] and [employer] 1/2 closed and coming from front porch with broom both hands full she was not thinking ran into garage door and knocked her backwards into the concrete driveway body I.e back went down then her head on eliquis. Head hit the concrete last no h/a, no neck pain ? ?Mid back pain in and out of bed worsens back pain severe mid back since fall Monday after 6 pm when she bumped into garage door. Advil helps 2-3 pills daily. Pain worse at night. Hot shower helps with pain and she has tried heat and ice  ? ? ?Review of Systems  ?Constitutional:  Negative for weight loss.  ?HENT:  Negative for hearing loss.   ?Eyes:  Negative for blurred vision.  ?Respiratory:  Negative for shortness of breath.   ?Cardiovascular:  Negative for chest pain.  ?Gastrointestinal:  Negative for abdominal pain and blood in stool.  ?Genitourinary:  Negative for dysuria.  ?Musculoskeletal:  Positive for back pain and falls. Negative for joint pain and neck pain.  ?Skin:  Negative for rash.  ?Neurological:  Negative for dizziness, loss of consciousness and headaches.  ?Psychiatric/Behavioral:  Negative for depression.   ?Past Medical History:  ?Diagnosis Date  ? Essential hypertension   ? Hyperlipidemia   ? Mitral valve prolapse   ? Moderate to Severe Mitral regurgitation   ? a. 07/2018 Echo: EF 65-70%. Mild AS. Mild MVP w/ mild MS and mod-sev MR. Sev dil LA. Midly dil RA. PASP 40-33mmHg.  ? PAF (paroxysmal atrial fibrillation) ([place])   ? a. CHA2DS2VASc = 4-->Eliquis 2.5mg  BID; b. 08/2019 Event Monitor: 100% Afib (52-199, avg 96). 4 runs of VT vs afib w/ aberrancy.  ? PAH (pulmonary artery hypertension) ([place])   ? a. 07/2018 Echo: PASP 40-39mmHg in setting of mild MS and  mod-sev MR.  ? ?Past Surgical History:  ?Procedure Laterality Date  ? BIOPSY BREAST  1980  ? left ,  benign  ? BREAST BIOPSY  1980  ? normal  ? CATARACT EXTRACTION W/PHACO Left 05/01/2020  ? Procedure: CATARACT EXTRACTION PHACO AND INTRAOCULAR LENS PLACEMENT (IOC) LEFT 6.81 00:48.8;  Surgeon: Birder Robson, MD;  Location: [facility];  Service: Ophthalmology;  Laterality: Left;  ? CATARACT EXTRACTION W/PHACO Right 05/22/2020  ? Procedure: CATARACT EXTRACTION PHACO AND INTRAOCULAR LENS PLACEMENT (IOC) RIGHT 7.41 00:43.3;  Surgeon: Birder Robson, MD;  Location: [facility];  Service: Ophthalmology;  Laterality: Right;  ? Hospitalized for endocarditis  1987  ? ?Family History  ?Problem Relation Age of Onset  ? Heart attack Father   ? Coronary artery disease Father   ? Cancer Sister   ?     esophageal  ? Heart disease Brother   ? ?Social History  ? ?Socioeconomic History  ? Marital status: Widowed  ?  Spouse name: Not on file  ? Number of children: 0  ? Years of education: Not on file  ? Highest education level: Not on file  ?Occupational History  ? Occupation: retired  ?Tobacco Use  ? Smoking status: Never  ? Smokeless tobacco: Never  ?Vaping Use  ? Vaping Use: Never used  ?Substance and Sexual Activity  ? Alcohol use: No  ?  Drug use: No  ? Sexual activity: Not Currently  ?Other Topics Concern  ? Not on file  ?Social History Narrative  ? Lives at [facility]  ? Exercise, walks 7 days a week with aerobic exercise 2 days a week  ?   ? ?Social Determinants of Health  ? ?Financial Resource Strain: Low Risk   ? Difficulty of Paying Living Expenses: Not hard at all  ?Food Insecurity: No Food Insecurity  ? Worried About Charity fundraiser in the Last Year: Never true  ? Ran Out of Food in the Last Year: Never true  ?Transportation Needs: No Transportation Needs  ? Lack of Transportation (Medical): No  ? Lack of Transportation (Non-Medical): No  ?Physical Activity: Sufficiently Active  ? Days of Exercise  per Week: 3 days  ? Minutes of Exercise per Session: 60 min  ?Stress: No Stress Concern Present  ? Feeling of Stress : Not at all  ?Social Connections: Unknown  ? Frequency of Communication with Friends and Family: More than three times a week  ? Frequency of Social Gatherings with Friends and Family: Once a week  ? Attends Religious Services: Not on file  ? Active Member of Clubs or Organizations: Yes  ? Attends Archivist Meetings: Not on file  ? Marital Status: Widowed  ?Intimate Partner Violence: Not At Risk  ? Fear of Current or Ex-Partner: No  ? Emotionally Abused: No  ? Physically Abused: No  ? Sexually Abused: No  ? ?Current Meds  ?Medication Sig  ? apixaban (ELIQUIS) 2.5 MG TABS tablet TAKE 1 TABLET BY MOUTH  TWICE DAILY  ? CALCIUM 600 1500 (600 Ca) MG TABS tablet Take by mouth.  ? Calcium Carbonate-Vit D-Min (CALCIUM 1200 PO) Take 600 mg by mouth.  ? ezetimibe (ZETIA) 10 MG tablet TAKE 1 TABLET BY MOUTH DAILY  ? furosemide (LASIX) 20 MG tablet TAKE 1 TABLET BY MOUTH  DAILY AS NEEDED FOR FLUID  RETENTION  ? metoprolol succinate (TOPROL-XL) 50 MG 24 hr tablet TAKE 1 TABLET BY MOUTH  DAILY WITH OR IMMEDIATELY  FOLLOWING A MEAL  ? Multiple Vitamin (MULTIVITAMIN) tablet Take 1 tablet by mouth daily.  ? potassium chloride (KLOR-CON) 10 MEQ tablet TAKE 1 TABLET BY MOUTH  DAILY  ? ?Current Facility-Administered Medications for the 12/05/21 encounter (Office Visit) with McLean-Scocuzza, Nino Glow, MD  ?Medication  ? denosumab (PROLIA) injection 60 mg  ? ?Allergies  ?Allergen Reactions  ? Codeine   ?  REACTION: N \\T \ V  ? Statins   ?  REACTION: INTOLERANCE: muscle aches  ? Sulfonamide Derivatives   ?  REACTION: N \\T \ V  ? ?No results found for this or any previous visit (from the past 2160 hour(s)). ?Objective  ?Body mass index is 20.04 kg/m?. ?Wt Readings from Last 3 Encounters:  ?12/05/21 99 lb 3.2 oz (45 kg)  ?09/18/21 105 lb (47.6 kg)  ?08/14/21 105 lb (47.6 kg)  ? ?Temp Readings from Last 3 Encounters:   ?12/05/21 97.6 ?F (36.4 ?C) (Oral)  ?08/14/21 97.7 ?F (36.5 ?C) (Oral)  ?02/12/21 (!) 95.5 ?F (35.3 ?C) (Temporal)  ? ?BP Readings from Last 3 Encounters:  ?12/05/21 110/68  ?08/14/21 (!) 148/58  ?07/23/21 (!) 160/70  ? ?Pulse Readings from Last 3 Encounters:  ?12/05/21 71  ?08/14/21 (!) 53  ?07/23/21 (!) 59  ? ? ?Physical Exam ?Vitals and nursing note reviewed.  ?Constitutional:   ?   Appearance: Normal appearance. She is well-developed and well-groomed.  ?HENT:  ?  Head: Normocephalic and atraumatic.  ?Eyes:  ?   Conjunctiva/sclera: Conjunctivae normal.  ?   Pupils: Pupils are equal, round, and reactive to light.  ?Cardiovascular:  ?   Rate and Rhythm: Normal rate and regular rhythm.  ?   Heart sounds: Normal heart sounds. No murmur heard. ?Pulmonary:  ?   Effort: Pulmonary effort is normal.  ?   Breath sounds: Normal breath sounds.  ?Abdominal:  ?   General: Abdomen is flat. Bowel sounds are normal.  ?   Tenderness: There is no abdominal tenderness.  ?Musculoskeletal:     ?   General: No tenderness.  ?Skin: ?   General: Skin is warm and dry.  ?Neurological:  ?   General: No focal deficit present.  ?   Mental Status: She is alert and oriented to person, place, and time. Mental status is at baseline.  ?   Cranial Nerves: Cranial nerves 2-12 are intact.  ?   Motor: Motor function is intact.  ?   Coordination: Coordination is intact.  ?   Gait: Gait is intact.  ?Psychiatric:     ?   Attention and Perception: Attention and perception normal.     ?   Mood and Affect: Mood and affect normal.     ?   Speech: Speech normal.     ?   Behavior: Behavior normal. Behavior is cooperative.     ?   Thought Content: Thought content normal.     ?   Cognition and Memory: Cognition and memory normal.     ?   Judgment: Judgment normal.  ? ? ?Assessment  ?Plan  ?Mid back pain and low back pain- Plan: DG Thoracic Spine 2 View ?Prn advil  ?Lidocaine pain patch  ?Rx PT at [facility] 1-3 x per week x 6-8 weeks ? ?Fall, initial encounter  12/02/21 see HPI on eliquis - Plan: CT HEAD WO CONTRAST (5MM) ?On anticoagulant therapy - Plan: CT HEAD WO CONTRAST (5MM) ?Traumatic injury of head, initial encounter - Plan: CT HEAD WO CONTRAST (5MM)

## 2021-12-05 NOTE — Telephone Encounter (Signed)
Pt has fallen a couple of days ago and it is getting worse. Pt is in a lot of pain on her back. The whole area is in pain. sent to access nurse ?

## 2021-12-05 NOTE — Telephone Encounter (Signed)
Waiting on triage note.  ?

## 2021-12-05 NOTE — Addendum Note (Signed)
Addended by: Quentin Ore on: 12/05/2021 05:03 PM ? ? Modules accepted: Orders ? ?

## 2021-12-05 NOTE — Patient Instructions (Signed)
Lidocaine pain patch  ?Advil  ?Physical therapy  ? ?

## 2021-12-05 NOTE — Telephone Encounter (Signed)
Pt was seen by Dr. French Ana today.  ?

## 2021-12-05 NOTE — Telephone Encounter (Signed)
Patient fell X 2 days ago on her back in her driveway. Pain rated at 10 mid way of her back. Walking does not make it worse. Getting in and out of bed is very had, she has to lay flat on her back and she cannot move due to she is unable to turn in bed. Patient say her back hit first and then her head. Patient refused ED scheduled wit Dr. Shirlee Latch at 2 PM. ?

## 2021-12-06 ENCOUNTER — Ambulatory Visit
Admission: RE | Admit: 2021-12-06 | Discharge: 2021-12-06 | Disposition: A | Discharge status: Home / Self Care | Payer: Medicare Other | Source: Ambulatory Visit | Attending: Internal Medicine | Admitting: Internal Medicine

## 2021-12-06 ENCOUNTER — Telehealth: Payer: Self-pay

## 2021-12-06 DIAGNOSIS — M549 Dorsalgia, unspecified: Secondary | ICD-10-CM | POA: Insufficient documentation

## 2021-12-06 DIAGNOSIS — M546 Pain in thoracic spine: Secondary | ICD-10-CM | POA: Diagnosis not present

## 2021-12-06 DIAGNOSIS — M8588 Other specified disorders of bone density and structure, other site: Secondary | ICD-10-CM | POA: Diagnosis not present

## 2021-12-06 DIAGNOSIS — M545 Low back pain, unspecified: Secondary | ICD-10-CM | POA: Diagnosis not present

## 2021-12-06 DIAGNOSIS — M4126 Other idiopathic scoliosis, lumbar region: Secondary | ICD-10-CM | POA: Diagnosis not present

## 2021-12-06 NOTE — Telephone Encounter (Signed)
Called pt to discuss x-ray results. Unable to reach nor lvm. ? ?Per [id number]  ?Age related changes and chronic sinusitis  ?If sinuses bothersome does she want to see ENT?  ?Also please ask her to go back to the same location for Xrays of mid and lower back walk in basis no appt needed  ?

## 2021-12-10 NOTE — Addendum Note (Signed)
Addended by: Quentin Ore on: 12/10/2021 04:56 PM ? ? Modules accepted: Orders ? ?

## 2021-12-17 DIAGNOSIS — M6281 Muscle weakness (generalized): Secondary | ICD-10-CM | POA: Diagnosis not present

## 2021-12-17 DIAGNOSIS — R278 Other lack of coordination: Secondary | ICD-10-CM | POA: Diagnosis not present

## 2021-12-17 DIAGNOSIS — W19XXXD Unspecified fall, subsequent encounter: Secondary | ICD-10-CM | POA: Diagnosis not present

## 2021-12-17 DIAGNOSIS — M549 Dorsalgia, unspecified: Secondary | ICD-10-CM | POA: Diagnosis not present

## 2021-12-17 DIAGNOSIS — M545 Low back pain, unspecified: Secondary | ICD-10-CM | POA: Diagnosis not present

## 2021-12-19 DIAGNOSIS — M6281 Muscle weakness (generalized): Secondary | ICD-10-CM | POA: Diagnosis not present

## 2021-12-19 DIAGNOSIS — R278 Other lack of coordination: Secondary | ICD-10-CM | POA: Diagnosis not present

## 2021-12-19 DIAGNOSIS — W19XXXD Unspecified fall, subsequent encounter: Secondary | ICD-10-CM | POA: Diagnosis not present

## 2021-12-19 DIAGNOSIS — M549 Dorsalgia, unspecified: Secondary | ICD-10-CM | POA: Diagnosis not present

## 2021-12-19 DIAGNOSIS — M545 Low back pain, unspecified: Secondary | ICD-10-CM | POA: Diagnosis not present

## 2021-12-24 ENCOUNTER — Other Ambulatory Visit: Payer: Self-pay | Admitting: Cardiovascular Disease

## 2021-12-24 DIAGNOSIS — R278 Other lack of coordination: Secondary | ICD-10-CM | POA: Diagnosis not present

## 2021-12-24 DIAGNOSIS — M6281 Muscle weakness (generalized): Secondary | ICD-10-CM | POA: Diagnosis not present

## 2021-12-24 DIAGNOSIS — M549 Dorsalgia, unspecified: Secondary | ICD-10-CM | POA: Diagnosis not present

## 2021-12-24 DIAGNOSIS — W19XXXD Unspecified fall, subsequent encounter: Secondary | ICD-10-CM | POA: Diagnosis not present

## 2021-12-24 DIAGNOSIS — M545 Low back pain, unspecified: Secondary | ICD-10-CM | POA: Diagnosis not present

## 2021-12-24 NOTE — Telephone Encounter (Signed)
Pt last saw Dr Mariah Milling 07/23/21, last labs 08/14/21 Creat 1.05, age 88, weight 45kg, based on specified criteria pt is on appropriate dosage of Eliquis 2.5mg  BID for afib.  Will refill rx.  ?

## 2021-12-24 NOTE — Telephone Encounter (Signed)
Please review

## 2021-12-26 DIAGNOSIS — M6281 Muscle weakness (generalized): Secondary | ICD-10-CM | POA: Diagnosis not present

## 2021-12-26 DIAGNOSIS — M549 Dorsalgia, unspecified: Secondary | ICD-10-CM | POA: Diagnosis not present

## 2021-12-26 DIAGNOSIS — M545 Low back pain, unspecified: Secondary | ICD-10-CM | POA: Diagnosis not present

## 2021-12-26 DIAGNOSIS — W19XXXD Unspecified fall, subsequent encounter: Secondary | ICD-10-CM | POA: Diagnosis not present

## 2021-12-26 DIAGNOSIS — R278 Other lack of coordination: Secondary | ICD-10-CM | POA: Diagnosis not present

## 2022-01-03 DIAGNOSIS — H43813 Vitreous degeneration, bilateral: Secondary | ICD-10-CM | POA: Diagnosis not present

## 2022-01-03 DIAGNOSIS — Z961 Presence of intraocular lens: Secondary | ICD-10-CM | POA: Diagnosis not present

## 2022-01-03 DIAGNOSIS — H35372 Puckering of macula, left eye: Secondary | ICD-10-CM | POA: Diagnosis not present

## 2022-01-07 DIAGNOSIS — M6281 Muscle weakness (generalized): Secondary | ICD-10-CM | POA: Diagnosis not present

## 2022-01-07 DIAGNOSIS — W19XXXD Unspecified fall, subsequent encounter: Secondary | ICD-10-CM | POA: Diagnosis not present

## 2022-01-07 DIAGNOSIS — R278 Other lack of coordination: Secondary | ICD-10-CM | POA: Diagnosis not present

## 2022-01-07 DIAGNOSIS — M549 Dorsalgia, unspecified: Secondary | ICD-10-CM | POA: Diagnosis not present

## 2022-01-07 DIAGNOSIS — M545 Low back pain, unspecified: Secondary | ICD-10-CM | POA: Diagnosis not present

## 2022-01-09 DIAGNOSIS — M545 Low back pain, unspecified: Secondary | ICD-10-CM | POA: Diagnosis not present

## 2022-01-09 DIAGNOSIS — R278 Other lack of coordination: Secondary | ICD-10-CM | POA: Diagnosis not present

## 2022-01-09 DIAGNOSIS — M549 Dorsalgia, unspecified: Secondary | ICD-10-CM | POA: Diagnosis not present

## 2022-01-09 DIAGNOSIS — M6281 Muscle weakness (generalized): Secondary | ICD-10-CM | POA: Diagnosis not present

## 2022-01-09 DIAGNOSIS — W19XXXD Unspecified fall, subsequent encounter: Secondary | ICD-10-CM | POA: Diagnosis not present

## 2022-01-10 ENCOUNTER — Encounter: Payer: Self-pay | Admitting: Internal Medicine

## 2022-01-10 DIAGNOSIS — M8580 Other specified disorders of bone density and structure, unspecified site: Secondary | ICD-10-CM | POA: Insufficient documentation

## 2022-01-10 DIAGNOSIS — I700 Atherosclerosis of aorta: Secondary | ICD-10-CM | POA: Insufficient documentation

## 2022-01-14 ENCOUNTER — Telehealth: Payer: Self-pay

## 2022-01-14 DIAGNOSIS — M6281 Muscle weakness (generalized): Secondary | ICD-10-CM | POA: Diagnosis not present

## 2022-01-14 DIAGNOSIS — M545 Low back pain, unspecified: Secondary | ICD-10-CM | POA: Diagnosis not present

## 2022-01-14 DIAGNOSIS — R278 Other lack of coordination: Secondary | ICD-10-CM | POA: Diagnosis not present

## 2022-01-14 DIAGNOSIS — M549 Dorsalgia, unspecified: Secondary | ICD-10-CM | POA: Diagnosis not present

## 2022-01-14 DIAGNOSIS — W19XXXD Unspecified fall, subsequent encounter: Secondary | ICD-10-CM | POA: Diagnosis not present

## 2022-01-14 NOTE — Telephone Encounter (Signed)
Lvm for pt to return call in regards to spine x-ray results.

## 2022-01-15 ENCOUNTER — Telehealth: Payer: Self-pay

## 2022-01-15 NOTE — Telephone Encounter (Signed)
Called pt x2 lvm x2 for pt to return call in regards to spine x-ray results.

## 2022-01-16 DIAGNOSIS — M545 Low back pain, unspecified: Secondary | ICD-10-CM | POA: Diagnosis not present

## 2022-01-16 DIAGNOSIS — W19XXXD Unspecified fall, subsequent encounter: Secondary | ICD-10-CM | POA: Diagnosis not present

## 2022-01-16 DIAGNOSIS — M549 Dorsalgia, unspecified: Secondary | ICD-10-CM | POA: Diagnosis not present

## 2022-01-16 DIAGNOSIS — M6281 Muscle weakness (generalized): Secondary | ICD-10-CM | POA: Diagnosis not present

## 2022-01-16 DIAGNOSIS — R278 Other lack of coordination: Secondary | ICD-10-CM | POA: Diagnosis not present

## 2022-01-16 NOTE — Telephone Encounter (Signed)
Pt returning call

## 2022-01-20 NOTE — Progress Notes (Unsigned)
Cardiology Office Note  Date:  01/21/2022   ID:  Brandi [name], DOB [birth year], MRN [id number]  PCP:  Brandi Shams, MD   Chief Complaint  Patient presents with   6 month follow up      "Doing well." Medications reviewed by the patient verbally.     HPI:  88 year old woman with  anterior leaflet MVP and  moderate to severe MR, in 2018 Mild to moderate  aortic valve stenosis Moderate to severe TR, RVSP 54 in 2018 Hyperlipidemia Lives at [facility] who presents for routine followup of her mitral and aortic valve disease, atrial fibrillation   Last seen by myself in clinic December 2022 Lives at [facility], lives in her own house  Weight is dropping in the past 6 months She does the groceries and all of the housework Feels that she is active at baseline Continues to walk daily, less walking recently secondary to Holiday representative at [facility] Previously attended regular exercise classes several days per week  Does not check BP at home Denies leg swelling or shortness of breath " I feel fine"  Takes lasix daily BMP in December 2022 showed stable renal function  No sx of shortness of breath, no edema Sleeping ok, no PND/orthopnea  No significant  tachycardia  EKG personally reviewed by myself on todays visit NSR rate 85 no significant ST-T wave changes, PVC noted, APC noted  Of past medical history reviewed event monitor 09/2019 was 100% atrial fib  Echo 02/2020 reviewed with her EF 60% Moderate MR, mild TR Mild AS  March 2021  back in normal sinus rhythm Tolerating Eliquis low-dose with metoprolol, no complaints  Event monitor, February 2021  discussed in detail Rhythm is atrial fibrillation/flutter, 100% burden ranging from 52-199 bpm (avg of 96 bpm). 4 Ventricular Tachycardia (unable to exclude atrial fibrillation with aberrancy) runs occurred, the run with the fastest interval lasting 9 beats with a max rate of 193 bpm, the longest lasting 10 beats with an avg  rate of 163 bpm.  No patient triggered Isolated VEs were occasional (1.8%, 32223), VE Couplets were rare (<1.0%, 58), and VE Triplets were rare (<1.0%, 2). Ventricular Bigeminy and Trigeminy were present. Patient triggered events were not associated with significant arrhythmia  Echo 07/2018 Left ventricle: The cavity size was normal. Wall thickness was   increased in a pattern of mild LVH. Systolic function was   vigorous. The estimated ejection fraction was in the range of 65%   to 70%.  - Aortic valve:  mild stenosis. - Mitral valve:  Mild prolapse. mild stenosis.  moderate to   severe regurgitation directed posteriorly. - Left atrium: The atrium was severely dilated. - Pulmonary arteries: Systolic pressure was moderately increased,   in the range of 40 mm Hg to 45 mm Hg plus central venous/right   atrial pressure.   PMH:   has a past medical history of Essential hypertension, Hyperlipidemia, Mitral valve prolapse, Moderate to Severe Mitral regurgitation, PAF (paroxysmal atrial fibrillation) (HCC), and PAH (pulmonary artery hypertension) (HCC).  PSH:    Past Surgical History:  Procedure Laterality Date   BIOPSY BREAST  1980   left ,  benign   BREAST BIOPSY  1980   normal   CATARACT EXTRACTION W/PHACO Left 05/01/2020   Procedure: CATARACT EXTRACTION PHACO AND INTRAOCULAR LENS PLACEMENT (IOC) LEFT 6.81 00:48.8;  Surgeon: Brandi Manila, MD;  Location: [facility];  Service: Ophthalmology;  Laterality: Left;   CATARACT EXTRACTION W/PHACO Right  05/22/2020   Procedure: CATARACT EXTRACTION PHACO AND INTRAOCULAR LENS PLACEMENT (IOC) RIGHT 7.41 00:43.3;  Surgeon: Brandi ManilaPorfilio, William, MD;  [facility];  Service: Ophthalmology;  Laterality: Right;   Hospitalized for endocarditis  1987    Current Outpatient Medications  Medication Sig Dispense Refill   apixaban (ELIQUIS) 2.5 MG TABS tablet TAKE 1 TABLET BY MOUTH TWICE  DAILY 180 tablet 1   CALCIUM 600 1500 (600  Ca) MG TABS tablet Take by mouth.     Calcium Carbonate-Vit D-Min (CALCIUM 1200 PO) Take 600 mg by mouth.     ezetimibe (ZETIA) 10 MG tablet TAKE 1 TABLET BY MOUTH DAILY 90 tablet 3   furosemide (LASIX) 20 MG tablet TAKE 1 TABLET BY MOUTH DAILY AS  NEEDED FOR FLUID RETENTION 90 tablet 3   metoprolol succinate (TOPROL-XL) 50 MG 24 hr tablet TAKE 1 TABLET BY MOUTH  DAILY WITH OR IMMEDIATELY  FOLLOWING A MEAL 90 tablet 1   Multiple Vitamin (MULTIVITAMIN) tablet Take 1 tablet by mouth daily.     potassium chloride (KLOR-CON M) 10 MEQ tablet TAKE 1 TABLET BY MOUTH  DAILY 90 tablet 3   Current Facility-Administered Medications  Medication Dose Route Frequency Provider Last Rate Last Admin   denosumab (PROLIA) injection 60 mg  60 mg Subcutaneous Q6 months Brandi Shamsullo, [name], MD   60 mg at 02/27/20 1106     Allergies:   Codeine, Statins, and Sulfonamide derivatives   Social History:  The patient  reports that she has never smoked. She has never used smokeless tobacco. She reports that she does not drink alcohol and does not use drugs.   Family History:   family history includes Cancer in her sister; Coronary artery disease in her father; Heart attack in her father; Heart disease in her brother.    Review of Systems: Review of Systems  Constitutional: Negative.   HENT: Negative.    Respiratory: Negative.    Cardiovascular: Negative.   Gastrointestinal: Negative.   Musculoskeletal: Negative.   Neurological: Negative.   Psychiatric/Behavioral: Negative.    All other systems reviewed and are negative.  PHYSICAL EXAM: VS:  BP 140/70 (BP Location: Left Arm, Patient Position: Sitting, Cuff Size: Normal)   Pulse 85   Ht 4\' 11"  (1.499 m)   Wt 93 lb 2 oz (42.2 kg)   SpO2 98%   BMI 18.81 kg/m  , BMI Body mass index is 18.81 kg/m. Constitutional:  oriented to person, place, and time. No distress.  HENT:  Head: Grossly normal Eyes:  no discharge. No scleral icterus.  Neck: No JVD, no carotid  bruits  Cardiovascular: Regular rate and rhythm, 2/6 systolic ejection murmur appreciated left sternal border into axilla  Pulmonary/Chest: Clear to auscultation bilaterally, no wheezes or rails Abdominal: Soft.  no distension.  no tenderness.  Musculoskeletal: Normal range of motion Neurological:  normal muscle tone. Coordination normal. No atrophy Skin: Skin warm and dry Psychiatric: normal affect, pleasant  Recent Labs: 02/12/2021: Hemoglobin 13.4; Platelets 190.0 08/14/2021: ALT 17; BUN 22; Creatinine, Ser 1.05; Potassium 4.3; Sodium 140    Lipid Panel Lab Results  Component Value Date   CHOL 165 08/14/2021   HDL 46.60 08/14/2021   LDLCALC 87 08/14/2021   TRIG 160.0 (H) 08/14/2021    Wt Readings from Last 3 Encounters:  01/21/22 93 lb 2 oz (42.2 kg)  12/05/21 99 lb 3.2 oz (45 kg)  09/18/21 105 lb (47.6 kg)     ASSESSMENT AND PLAN:  Atrial flutter Maintaining  normal sinus rhythm on Eliquis,  Heart rate is higher on today's visit Recommend she make sure she is taking metoprolol succinate 50 daily, Arrhythmia jan and feb 2021  Mixed hyperlipidemia Tolerating Zetia Reasonable cholesterol numbers   Chronic venous insufficiency  recommend compression hose Worsening edema today, recommend Lasix as below, 2-3 times a week  MITRAL REGURGITATION No SOB symptoms on exertion Mitral valve prolapse, known Moderate MR Previous did not want surgery No symptoms  Aortic valve stenosis Mild aortic valve stenosis on echocardiogram No sx, medical management  Pulmonary HTN With moderate Tricuspid regurg Denies SOB, continue Lasix daily for now We have ordered BMP No edema    Total encounter time more than 30 minutes  Greater than 50% was spent in counseling and coordination of care with the patient    Orders Placed This Encounter  Procedures   EKG 12-Lead      Signed, Dossie Arbour, M.D., Ph.D. 01/21/2022  [facility] [place],  [place] [phone]

## 2022-01-21 ENCOUNTER — Ambulatory Visit (INDEPENDENT_AMBULATORY_CARE_PROVIDER_SITE_OTHER): Payer: Medicare Other | Admitting: Cardiovascular Disease

## 2022-01-21 ENCOUNTER — Other Ambulatory Visit
Admission: RE | Admit: 2022-01-21 | Discharge: 2022-01-21 | Disposition: A | Discharge status: Home / Self Care | Payer: Medicare Other | Source: Ambulatory Visit | Attending: Cardiovascular Disease | Admitting: Cardiovascular Disease

## 2022-01-21 ENCOUNTER — Encounter: Payer: Self-pay | Admitting: Cardiovascular Disease

## 2022-01-21 VITALS — BP 140/70 | HR 85 | Ht 59.0 in | Wt 93.1 lb

## 2022-01-21 DIAGNOSIS — I872 Venous insufficiency (chronic) (peripheral): Secondary | ICD-10-CM | POA: Diagnosis not present

## 2022-01-21 DIAGNOSIS — I4892 Unspecified atrial flutter: Secondary | ICD-10-CM | POA: Diagnosis not present

## 2022-01-21 DIAGNOSIS — I080 Rheumatic disorders of both mitral and aortic valves: Secondary | ICD-10-CM

## 2022-01-21 DIAGNOSIS — I480 Paroxysmal atrial fibrillation: Secondary | ICD-10-CM | POA: Insufficient documentation

## 2022-01-21 DIAGNOSIS — I340 Nonrheumatic mitral (valve) insufficiency: Secondary | ICD-10-CM

## 2022-01-21 DIAGNOSIS — I350 Nonrheumatic aortic (valve) stenosis: Secondary | ICD-10-CM | POA: Diagnosis not present

## 2022-01-21 DIAGNOSIS — I10 Essential (primary) hypertension: Secondary | ICD-10-CM | POA: Diagnosis not present

## 2022-01-21 DIAGNOSIS — E782 Mixed hyperlipidemia: Secondary | ICD-10-CM

## 2022-01-21 DIAGNOSIS — I272 Pulmonary hypertension, unspecified: Secondary | ICD-10-CM | POA: Diagnosis not present

## 2022-01-21 LAB — BASIC METABOLIC PANEL
Anion gap: 9 (ref 5–15)
BUN: 18 mg/dL (ref 8–23)
CO2: 29 mmol/L (ref 22–32)
Calcium: 9.3 mg/dL (ref 8.9–10.3)
Chloride: 102 mmol/L (ref 98–111)
Creatinine, Ser: 1.17 mg/dL — ABNORMAL HIGH (ref 0.44–1.00)
GFR, Estimated: 44 mL/min — ABNORMAL LOW (ref >60)
Glucose, Bld: 112 mg/dL — ABNORMAL HIGH (ref 70–99)
Potassium: 4.2 mmol/L (ref 3.5–5.1)
Sodium: 140 mmol/L (ref 135–145)

## 2022-01-21 NOTE — Patient Instructions (Addendum)
Medication Instructions:  No changes  If you need a refill on your cardiac medications before your next appointment, please call your pharmacy.   Lab work:  Today: BMP   Medical Mall Entrance at [facility] 1st desk on the right to check in (REGISTRATION)  Lab hours: Monday- Friday (7:30 am- 5:30 pm)   Testing/Procedures: No new testing needed  Follow-Up: At [facility], you and your health needs are our priority.  As part of our continuing mission to provide you with exceptional heart care, we have created designated Provider Care Teams.  These Care Teams include your primary Cardiologist (physician) and Advanced Practice Providers (APPs -  Physician Assistants and Nurse Practitioners) who all work together to provide you with the care you need, when you need it.  You will need a follow up appointment in 6 months, APP ok  Providers on your designated Care Team:   Nicolasa Ducking, NP Eula Listen, PA-C Cadence Fransico Michael, [place]  COVID-19 Vaccine Information can be found at: [email or web address] For questions related to vaccine distribution or appointments, please email [email or web address] or call [phone].

## 2022-01-23 DIAGNOSIS — M545 Low back pain, unspecified: Secondary | ICD-10-CM | POA: Diagnosis not present

## 2022-01-23 DIAGNOSIS — M549 Dorsalgia, unspecified: Secondary | ICD-10-CM | POA: Diagnosis not present

## 2022-01-23 DIAGNOSIS — R278 Other lack of coordination: Secondary | ICD-10-CM | POA: Diagnosis not present

## 2022-01-23 DIAGNOSIS — W19XXXD Unspecified fall, subsequent encounter: Secondary | ICD-10-CM | POA: Diagnosis not present

## 2022-01-23 DIAGNOSIS — M6281 Muscle weakness (generalized): Secondary | ICD-10-CM | POA: Diagnosis not present

## 2022-01-24 ENCOUNTER — Telehealth: Payer: Self-pay | Admitting: Emergency Medicine

## 2022-01-24 NOTE — Telephone Encounter (Signed)
-----   Message from Antonieta Iba, MD sent at 01/22/2022  7:01 PM EDT ----- Lab work reviewed stable BMP

## 2022-01-24 NOTE — Telephone Encounter (Signed)
Called and spoke with patient. Results reviewed with patient, pt verbalized understanding,  questions (if any) answered.   ?

## 2022-01-28 DIAGNOSIS — M545 Low back pain, unspecified: Secondary | ICD-10-CM | POA: Diagnosis not present

## 2022-01-28 DIAGNOSIS — R278 Other lack of coordination: Secondary | ICD-10-CM | POA: Diagnosis not present

## 2022-01-28 DIAGNOSIS — W19XXXD Unspecified fall, subsequent encounter: Secondary | ICD-10-CM | POA: Diagnosis not present

## 2022-01-28 DIAGNOSIS — M549 Dorsalgia, unspecified: Secondary | ICD-10-CM | POA: Diagnosis not present

## 2022-01-28 DIAGNOSIS — M6281 Muscle weakness (generalized): Secondary | ICD-10-CM | POA: Diagnosis not present

## 2022-01-31 DIAGNOSIS — W19XXXD Unspecified fall, subsequent encounter: Secondary | ICD-10-CM | POA: Diagnosis not present

## 2022-01-31 DIAGNOSIS — M545 Low back pain, unspecified: Secondary | ICD-10-CM | POA: Diagnosis not present

## 2022-01-31 DIAGNOSIS — R278 Other lack of coordination: Secondary | ICD-10-CM | POA: Diagnosis not present

## 2022-01-31 DIAGNOSIS — M549 Dorsalgia, unspecified: Secondary | ICD-10-CM | POA: Diagnosis not present

## 2022-01-31 DIAGNOSIS — M6281 Muscle weakness (generalized): Secondary | ICD-10-CM | POA: Diagnosis not present

## 2022-02-04 ENCOUNTER — Ambulatory Visit
Admission: EM | Admit: 2022-02-04 | Discharge: 2022-02-04 | Disposition: A | Discharge status: Home / Self Care | Payer: Medicare Other | Attending: Family Medicine | Admitting: Family Medicine

## 2022-02-04 ENCOUNTER — Encounter: Payer: Self-pay | Admitting: Emergency Medicine

## 2022-02-04 DIAGNOSIS — R278 Other lack of coordination: Secondary | ICD-10-CM | POA: Diagnosis not present

## 2022-02-04 DIAGNOSIS — W19XXXD Unspecified fall, subsequent encounter: Secondary | ICD-10-CM | POA: Diagnosis not present

## 2022-02-04 DIAGNOSIS — E860 Dehydration: Secondary | ICD-10-CM | POA: Insufficient documentation

## 2022-02-04 DIAGNOSIS — R410 Disorientation, unspecified: Secondary | ICD-10-CM | POA: Insufficient documentation

## 2022-02-04 DIAGNOSIS — M549 Dorsalgia, unspecified: Secondary | ICD-10-CM | POA: Diagnosis not present

## 2022-02-04 DIAGNOSIS — M6281 Muscle weakness (generalized): Secondary | ICD-10-CM | POA: Diagnosis not present

## 2022-02-04 DIAGNOSIS — N3001 Acute cystitis with hematuria: Secondary | ICD-10-CM | POA: Insufficient documentation

## 2022-02-04 DIAGNOSIS — M545 Low back pain, unspecified: Secondary | ICD-10-CM | POA: Diagnosis not present

## 2022-02-04 LAB — POCT URINALYSIS DIP (MANUAL ENTRY)
Glucose, UA: NEGATIVE mg/dL
Ketones, POC UA: NEGATIVE mg/dL
Nitrite, UA: NEGATIVE
Protein Ur, POC: 30 mg/dL — AB
Spec Grav, UA: 1.02 (ref 1.010–1.025)
Urobilinogen, UA: 1 E.U./dL
pH, UA: 6.5 (ref 5.0–8.0)

## 2022-02-04 MED ORDER — CEPHALEXIN 500 MG PO CAPS: 500.0000 mg | ORAL_CAPSULE | Freq: Two times a day (BID) | ORAL | 0 refills | Status: AC

## 2022-02-04 NOTE — ED Provider Notes (Signed)
Brandi [name]    CSN: [id number] Arrival date & time: 02/04/22  1012      History   Chief Complaint Chief Complaint  Patient presents with   UTI    HPI Brandi [name] is a 88 y.o. female.   HPI Patient with history chronic atrial fibrillation, vascular disease, presents for evaluation of acute intermittent confusion over the last 2 weeks. Patient and her neighbor has noticed that she has been intermittently confused and more forgetful. She is accompanied by her neighbor who has noticed some confusion and was concerned that patient may be dehydrated and or have a UTI. Patient was recently seen by cardiologist and had routine labs which were stable. She denies any weakness, shortness of breath, or chest pain. She is eating normally and endorses that she is taking medication as prescribed. She has not recently been sick. Denies any URI or UTI symptoms. She denies any recent or current pain. Past Medical History:  Diagnosis Date   Essential hypertension    Hyperlipidemia    Mitral valve prolapse    Moderate to Severe Mitral regurgitation    a. 07/2018 Echo: EF 65-70%. Mild AS. Mild MVP w/ mild MS and mod-sev MR. Sev dil LA. Midly dil RA. PASP 40-27mmHg.   PAF (paroxysmal atrial fibrillation) (HCC)    a. CHA2DS2VASc = 4-->Eliquis 2.5mg  BID; b. 08/2019 Event Monitor: 100% Afib (52-199, avg 96). 4 runs of VT vs afib w/ aberrancy.   PAH (pulmonary artery hypertension) (HCC)    a. 07/2018 Echo: PASP 40-83mmHg in setting of mild MS and mod-sev MR.    Patient Active Problem List   Diagnosis Date Noted   Osteopenia 01/10/2022   Aortic atherosclerosis (HCC) 01/10/2022   Myalgia due to statin 08/14/2021   Cognitive attention deficit 02/13/2021   Hospital discharge follow-up 02/28/2020   Abnormal intentional weight loss 02/27/2020   HLD (hyperlipidemia) 02/16/2020   Acquired thrombophilia (HCC) 02/10/2020   Dupuytren's contracture of right hand 11/06/2019   Atrial fibrillation  (HCC) 11/06/2019   Close exposure to COVID-19 virus 01/25/2019   Long-term current use of high risk medication other than anticoagulant 05/24/2017   Aortic valve stenosis 11/09/2016   Mitral regurgitation 11/09/2016   Pulmonary HTN (HCC) 11/09/2016   Closed displaced fracture of olecranon process with intraarticular extension of right ulna 07/06/2014   Dry mouth 01/31/2014   Encounter for preventive health examination 01/26/2013   Edema 06/26/2011   Essential hypertension 07/24/2009   Chronic venous insufficiency 01/30/2009   Herpes zoster 10/12/2008   Hyperlipidemia 10/12/2008   HEARING LOSS, TINNITUS 10/12/2008   Osteoporosis 10/12/2008    Past Surgical History:  Procedure Laterality Date   BIOPSY BREAST  1980   left ,  benign   BREAST BIOPSY  1980   normal   CATARACT EXTRACTION W/PHACO Left 05/01/2020   Procedure: CATARACT EXTRACTION PHACO AND INTRAOCULAR LENS PLACEMENT (IOC) LEFT 6.81 00:48.8;  Surgeon: Galen Manila, MD;  Location: [facility];  Service: Ophthalmology;  Laterality: Left;   CATARACT EXTRACTION W/PHACO Right 05/22/2020   Procedure: CATARACT EXTRACTION PHACO AND INTRAOCULAR LENS PLACEMENT (IOC) RIGHT 7.41 00:43.3;  Surgeon: Galen Manila, MD;  Location: [facility];  Service: Ophthalmology;  Laterality: Right;   Hospitalized for endocarditis  1987    OB History   No obstetric history on file.      Home Medications    Prior to Admission medications   Medication Sig Start Date End Date Taking? Authorizing Provider  cephALEXin (KEFLEX) 500  MG capsule Take 1 capsule (500 mg total) by mouth 2 (two) times daily for 7 days. 02/04/22 02/11/22 Yes Scot Jun, FNP  apixaban (ELIQUIS) 2.5 MG TABS tablet TAKE 1 TABLET BY MOUTH TWICE  DAILY 12/24/21   Minna Merritts, MD  CALCIUM 600 1500 (600 Ca) MG TABS tablet Take by mouth. 04/13/20   [provider]  Calcium Carbonate-Vit D-Min (CALCIUM 1200 PO) Take 600 mg by mouth.     [provider]  ezetimibe (ZETIA) 10 MG tablet TAKE 1 TABLET BY MOUTH DAILY 12/24/21   Minna Merritts, MD  furosemide (LASIX) 20 MG tablet TAKE 1 TABLET BY MOUTH DAILY AS  NEEDED FOR FLUID RETENTION 12/24/21   Minna Merritts, MD  metoprolol succinate (TOPROL-XL) 50 MG 24 hr tablet TAKE 1 TABLET BY MOUTH  DAILY WITH OR IMMEDIATELY  FOLLOWING A MEAL 09/04/21   Minna Merritts, MD  Multiple Vitamin (MULTIVITAMIN) tablet Take 1 tablet by mouth daily.    [provider]  potassium chloride (KLOR-CON M) 10 MEQ tablet TAKE 1 TABLET BY MOUTH  DAILY 12/24/21   Minna Merritts, MD    Family History Family History  Problem Relation Age of Onset   Heart attack Father    Coronary artery disease Father    Cancer Sister        esophageal   Heart disease Brother     Social History Social History   Tobacco Use   Smoking status: Never   Smokeless tobacco: Never  Vaping Use   Vaping Use: Never used  Substance Use Topics   Alcohol use: No   Drug use: No     Allergies   Codeine, Statins, and Sulfonamide derivatives   Review of Systems Review of Systems Pertinent negatives listed in HPI   Physical Exam Triage Vital Signs ED Triage Vitals  Enc Vitals Group     BP      Pulse      Resp      Temp      Temp src      SpO2      Weight      Height      Head Circumference      Peak Flow      Pain Score      Pain Loc      Pain Edu?      Excl. in [place]?    No data found.  Updated Vital Signs BP (!) 152/78   Pulse 81   Temp 98.1 F (36.7 C) (Oral)   Resp 16   SpO2 96%   Visual Acuity Right Eye Distance:   Left Eye Distance:   Bilateral Distance:    Right Eye Near:   Left Eye Near:    Bilateral Near:     Physical Exam Constitutional:      Appearance: She is underweight.  Eyes:     Extraocular Movements: Extraocular movements intact.     Conjunctiva/sclera: Conjunctivae normal.  Cardiovascular:     Rate and Rhythm: Normal rate and regular rhythm.   Pulmonary:     Effort: Pulmonary effort is normal.     Breath sounds: Normal breath sounds and air entry.  Abdominal:     General: Abdomen is flat.     Palpations: Abdomen is soft.     Tenderness: There is no abdominal tenderness.  Skin:    General: Skin is warm.     Capillary Refill: Capillary refill takes less than 2  seconds.     Coloration: Skin is not ashen, jaundiced or pale.  Neurological:     Mental Status: She is alert and oriented to person, place, and time.     GCS: GCS eye subscore is 4. GCS verbal subscore is 5. GCS motor subscore is 6.     Cranial Nerves: No facial asymmetry.     Motor: Motor function is intact.     Coordination: Coordination is intact.     Gait: Gait is intact.  Psychiatric:        Behavior: Behavior is cooperative.      UC Treatments / Results  Labs (all labs ordered are listed, but only abnormal results are displayed) Labs Reviewed  POCT URINALYSIS DIP (MANUAL ENTRY) - Abnormal; Notable for the following components:      Result Value   Color, UA brown (*)    Clarity, UA cloudy (*)    Bilirubin, UA small (*)    Blood, UA trace-intact (*)    Protein Ur, POC =30 (*)    Leukocytes, UA Large (3+) (*)    All other components within normal limits  URINE CULTURE    EKG   Radiology No results found.  Procedures Procedures (including critical care time)  Medications Ordered in UC Medications - No data to display  Initial Impression / Assessment and Plan / UC Course  I have reviewed the triage vital signs and the nursing notes.  Pertinent labs & imaging results that were available during my care of the patient were reviewed by me and considered in my medical decision making (see chart for details).   Patients appears to be mental and neurologically intact during visit today. She participated in all aspects of the visit and was able to appropriately answer all questions. Of concern UA was grossly abnormal and indicates mild dehydration  due to increased SPG and + protein and + large WBC. Urine culture is pending. Encouraged to hydrate well with fluids. Patient is scheduled to follow-up with PCP in 8 days, and recommended a urine recheck at that visit. Strict ED precautions given if any symptoms worsen or do not readily improve. Patient and caregiver verbalized understanding and agreement with plan. Final Clinical Impressions(s) / UC Diagnoses   Final diagnoses:  Mild dehydration  Acute cystitis with hematuria  Intermittent confusion     Discharge Instructions      I am treating you for a suspected urinary tract infection. Start medication today.  You are mildly dehydrated.  Start oral hydration today and try and drink at least 3 bottles of  water per day.  Emergency department if any of your symptoms worsen or become severe. Keep follow-up with primary doctor next week and have urine rechecked. At your primary care visit, have your urine rechecked. Urine culture will result within 3-4 business days.       ED Prescriptions     Medication Sig Dispense Auth. Provider   cephALEXin (KEFLEX) 500 MG capsule Take 1 capsule (500 mg total) by mouth 2 (two) times daily for 7 days. 14 capsule Bing Neighbors, FNP      PDMP not reviewed this encounter.   Bing Neighbors, FNP 02/04/22 1257

## 2022-02-04 NOTE — Discharge Instructions (Addendum)
I am treating you for a suspected urinary tract infection. Start medication today.  You are mildly dehydrated.  Start oral hydration today and try and drink at least 3 bottles of  water per day.  Emergency department if any of your symptoms worsen or become severe. Keep follow-up with primary doctor next week and have urine rechecked. At your primary care visit, have your urine rechecked. Urine culture will result within 3-4 business days.

## 2022-02-04 NOTE — ED Triage Notes (Signed)
Pt presents with her neighbor who states she was confused yesterday. She wanted to verify that she didn't have a UTI. Pt denies any symptoms and states she has been feeling confused for 2 weeks.

## 2022-02-06 LAB — URINE CULTURE: Culture: 10000 — AB

## 2022-02-12 ENCOUNTER — Other Ambulatory Visit: Payer: Self-pay

## 2022-02-12 ENCOUNTER — Emergency Department: Payer: Medicare Other

## 2022-02-12 ENCOUNTER — Inpatient Hospital Stay
Admission: EM | Admit: 2022-02-12 | Discharge: 2022-02-15 | DRG: 291 | Disposition: A | Discharge status: Home Health | Payer: Medicare Other | Attending: Internal Medicine | Admitting: Internal Medicine

## 2022-02-12 ENCOUNTER — Encounter: Payer: Self-pay | Admitting: Internal Medicine

## 2022-02-12 ENCOUNTER — Ambulatory Visit (INDEPENDENT_AMBULATORY_CARE_PROVIDER_SITE_OTHER): Payer: Medicare Other | Admitting: Internal Medicine

## 2022-02-12 DIAGNOSIS — Z885 Allergy status to narcotic agent status: Secondary | ICD-10-CM

## 2022-02-12 DIAGNOSIS — I509 Heart failure, unspecified: Secondary | ICD-10-CM

## 2022-02-12 DIAGNOSIS — N390 Urinary tract infection, site not specified: Secondary | ICD-10-CM | POA: Diagnosis not present

## 2022-02-12 DIAGNOSIS — H9190 Unspecified hearing loss, unspecified ear: Secondary | ICD-10-CM | POA: Diagnosis not present

## 2022-02-12 DIAGNOSIS — E875 Hyperkalemia: Secondary | ICD-10-CM | POA: Diagnosis present

## 2022-02-12 DIAGNOSIS — I2721 Secondary pulmonary arterial hypertension: Secondary | ICD-10-CM | POA: Diagnosis not present

## 2022-02-12 DIAGNOSIS — F05 Delirium due to known physiological condition: Secondary | ICD-10-CM | POA: Diagnosis present

## 2022-02-12 DIAGNOSIS — G9341 Metabolic encephalopathy: Secondary | ICD-10-CM | POA: Diagnosis present

## 2022-02-12 DIAGNOSIS — Z79899 Other long term (current) drug therapy: Secondary | ICD-10-CM | POA: Diagnosis not present

## 2022-02-12 DIAGNOSIS — R410 Disorientation, unspecified: Secondary | ICD-10-CM

## 2022-02-12 DIAGNOSIS — Z602 Problems related to living alone: Secondary | ICD-10-CM | POA: Diagnosis present

## 2022-02-12 DIAGNOSIS — M8000XS Age-related osteoporosis with current pathological fracture, unspecified site, sequela: Secondary | ICD-10-CM

## 2022-02-12 DIAGNOSIS — T162XXA Foreign body in left ear, initial encounter: Secondary | ICD-10-CM | POA: Diagnosis present

## 2022-02-12 DIAGNOSIS — N179 Acute kidney failure, unspecified: Secondary | ICD-10-CM | POA: Diagnosis present

## 2022-02-12 DIAGNOSIS — I248 Other forms of acute ischemic heart disease: Secondary | ICD-10-CM | POA: Diagnosis not present

## 2022-02-12 DIAGNOSIS — I341 Nonrheumatic mitral (valve) prolapse: Secondary | ICD-10-CM | POA: Diagnosis present

## 2022-02-12 DIAGNOSIS — I480 Paroxysmal atrial fibrillation: Secondary | ICD-10-CM

## 2022-02-12 DIAGNOSIS — Z800 Family history of malignant neoplasm of digestive organs: Secondary | ICD-10-CM | POA: Diagnosis not present

## 2022-02-12 DIAGNOSIS — I10 Essential (primary) hypertension: Secondary | ICD-10-CM | POA: Diagnosis present

## 2022-02-12 DIAGNOSIS — B952 Enterococcus as the cause of diseases classified elsewhere: Secondary | ICD-10-CM

## 2022-02-12 DIAGNOSIS — Z7901 Long term (current) use of anticoagulants: Secondary | ICD-10-CM | POA: Diagnosis not present

## 2022-02-12 DIAGNOSIS — Z888 Allergy status to other drugs, medicaments and biological substances status: Secondary | ICD-10-CM

## 2022-02-12 DIAGNOSIS — I5033 Acute on chronic diastolic (congestive) heart failure: Secondary | ICD-10-CM | POA: Diagnosis present

## 2022-02-12 DIAGNOSIS — E860 Dehydration: Secondary | ICD-10-CM | POA: Diagnosis present

## 2022-02-12 DIAGNOSIS — Z66 Do not resuscitate: Secondary | ICD-10-CM | POA: Diagnosis present

## 2022-02-12 DIAGNOSIS — G9340 Encephalopathy, unspecified: Secondary | ICD-10-CM

## 2022-02-12 DIAGNOSIS — I4891 Unspecified atrial fibrillation: Secondary | ICD-10-CM | POA: Diagnosis not present

## 2022-02-12 DIAGNOSIS — R54 Age-related physical debility: Secondary | ICD-10-CM | POA: Diagnosis present

## 2022-02-12 DIAGNOSIS — Z8249 Family history of ischemic heart disease and other diseases of the circulatory system: Secondary | ICD-10-CM | POA: Diagnosis not present

## 2022-02-12 DIAGNOSIS — I5031 Acute diastolic (congestive) heart failure: Secondary | ICD-10-CM | POA: Diagnosis not present

## 2022-02-12 DIAGNOSIS — W19XXXA Unspecified fall, initial encounter: Secondary | ICD-10-CM | POA: Diagnosis present

## 2022-02-12 DIAGNOSIS — I272 Pulmonary hypertension, unspecified: Secondary | ICD-10-CM | POA: Diagnosis not present

## 2022-02-12 DIAGNOSIS — I340 Nonrheumatic mitral (valve) insufficiency: Secondary | ICD-10-CM | POA: Diagnosis present

## 2022-02-12 DIAGNOSIS — R079 Chest pain, unspecified: Secondary | ICD-10-CM | POA: Diagnosis not present

## 2022-02-12 DIAGNOSIS — I110 Hypertensive heart disease with heart failure: Secondary | ICD-10-CM | POA: Diagnosis not present

## 2022-02-12 DIAGNOSIS — Z681 Body mass index (BMI) 19 or less, adult: Secondary | ICD-10-CM

## 2022-02-12 DIAGNOSIS — Z974 Presence of external hearing-aid: Secondary | ICD-10-CM

## 2022-02-12 DIAGNOSIS — Z9181 History of falling: Secondary | ICD-10-CM | POA: Insufficient documentation

## 2022-02-12 DIAGNOSIS — R4182 Altered mental status, unspecified: Secondary | ICD-10-CM | POA: Diagnosis not present

## 2022-02-12 DIAGNOSIS — E43 Unspecified severe protein-calorie malnutrition: Secondary | ICD-10-CM | POA: Diagnosis present

## 2022-02-12 DIAGNOSIS — E785 Hyperlipidemia, unspecified: Secondary | ICD-10-CM | POA: Diagnosis present

## 2022-02-12 DIAGNOSIS — Z882 Allergy status to sulfonamides status: Secondary | ICD-10-CM

## 2022-02-12 DIAGNOSIS — R778 Other specified abnormalities of plasma proteins: Secondary | ICD-10-CM | POA: Diagnosis present

## 2022-02-12 LAB — URINALYSIS, ROUTINE W REFLEX MICROSCOPIC
Bilirubin Urine: NEGATIVE
Glucose, UA: NEGATIVE mg/dL
Hgb urine dipstick: NEGATIVE
Ketones, ur: NEGATIVE mg/dL
Nitrite: NEGATIVE
Protein, ur: NEGATIVE mg/dL
Specific Gravity, Urine: 1.009 (ref 1.005–1.030)
pH: 6 (ref 5.0–8.0)

## 2022-02-12 LAB — URINALYSIS, COMPLETE (UACMP) WITH MICROSCOPIC
Bacteria, UA: NONE SEEN
Bilirubin Urine: NEGATIVE
Glucose, UA: NEGATIVE mg/dL
Hgb urine dipstick: NEGATIVE
Ketones, ur: NEGATIVE mg/dL
Leukocytes,Ua: NEGATIVE
Nitrite: NEGATIVE
Protein, ur: NEGATIVE mg/dL
Specific Gravity, Urine: 1.004 — ABNORMAL LOW (ref 1.005–1.030)
Squamous Epithelial / HPF: NONE SEEN (ref 0–5)
WBC, UA: NONE SEEN WBC/hpf (ref 0–5)
pH: 7 (ref 5.0–8.0)

## 2022-02-12 LAB — COMPREHENSIVE METABOLIC PANEL
ALT: 22 U/L (ref 0–44)
AST: 59 U/L — ABNORMAL HIGH (ref 15–41)
Albumin: 3.1 g/dL — ABNORMAL LOW (ref 3.5–5.0)
Alkaline Phosphatase: 73 U/L (ref 38–126)
Anion gap: 8 (ref 5–15)
BUN: 28 mg/dL — ABNORMAL HIGH (ref 8–23)
CO2: 26 mmol/L (ref 22–32)
Calcium: 9.3 mg/dL (ref 8.9–10.3)
Chloride: 101 mmol/L (ref 98–111)
Creatinine, Ser: 1.32 mg/dL — ABNORMAL HIGH (ref 0.44–1.00)
GFR, Estimated: 38 mL/min — ABNORMAL LOW (ref >60)
Glucose, Bld: 131 mg/dL — ABNORMAL HIGH (ref 70–99)
Potassium: 5.4 mmol/L — ABNORMAL HIGH (ref 3.5–5.1)
Sodium: 135 mmol/L (ref 135–145)
Total Bilirubin: 0.9 mg/dL (ref 0.3–1.2)
Total Protein: 6.5 g/dL (ref 6.5–8.1)

## 2022-02-12 LAB — CBC WITH DIFFERENTIAL/PLATELET
Abs Immature Granulocytes: 0.09 10*3/uL — ABNORMAL HIGH (ref 0.00–0.07)
Basophils Absolute: 0 10*3/uL (ref 0.0–0.1)
Basophils Relative: 0 %
Eosinophils Absolute: 0.2 10*3/uL (ref 0.0–0.5)
Eosinophils Relative: 1 %
HCT: 43.6 % (ref 36.0–46.0)
Hemoglobin: 13.7 g/dL (ref 12.0–15.0)
Immature Granulocytes: 1 %
Lymphocytes Relative: 7 %
Lymphs Abs: 0.8 10*3/uL (ref 0.7–4.0)
MCH: 31.3 pg (ref 26.0–34.0)
MCHC: 31.4 g/dL (ref 30.0–36.0)
MCV: 99.5 fL (ref 80.0–100.0)
Monocytes Absolute: 1 10*3/uL (ref 0.1–1.0)
Monocytes Relative: 9 %
Neutro Abs: 9 10*3/uL — ABNORMAL HIGH (ref 1.7–7.7)
Neutrophils Relative %: 82 %
Platelets: 390 10*3/uL (ref 150–400)
RBC: 4.38 MIL/uL (ref 3.87–5.11)
RDW: 14.6 % (ref 11.5–15.5)
WBC: 11.1 10*3/uL — ABNORMAL HIGH (ref 4.0–10.5)
nRBC: 0 % (ref 0.0–0.2)

## 2022-02-12 LAB — TROPONIN I (HIGH SENSITIVITY)
Troponin I (High Sensitivity): 99 ng/L — ABNORMAL HIGH (ref <18)
Troponin I (High Sensitivity): 80 ng/L — ABNORMAL HIGH (ref <18)

## 2022-02-12 LAB — LACTIC ACID, PLASMA
Lactic Acid, Venous: 2.7 mmol/L (ref 0.5–1.9)
Lactic Acid, Venous: 1.5 mmol/L (ref 0.5–1.9)

## 2022-02-12 LAB — BRAIN NATRIURETIC PEPTIDE: B Natriuretic Peptide: 1170.7 pg/mL — ABNORMAL HIGH (ref 0.0–100.0)

## 2022-02-12 MED ORDER — DILTIAZEM HCL 30 MG PO TABS
60.0000 mg | ORAL_TABLET | Freq: Once | ORAL | Status: AC
  Administered 2022-02-12: 60 mg via ORAL
  Filled 2022-02-12: qty 2

## 2022-02-12 MED ORDER — APIXABAN 2.5 MG PO TABS
2.5000 mg | ORAL_TABLET | Freq: Two times a day (BID) | ORAL | Status: DC
  Administered 2022-02-13 – 2022-02-15 (×5): 2.5 mg via ORAL
  Filled 2022-02-12 (×5): qty 1

## 2022-02-12 MED ORDER — SODIUM CHLORIDE 0.9 % IV BOLUS
1000.0000 mL | Freq: Once | INTRAVENOUS | Status: AC
  Administered 2022-02-12: 1000 mL via INTRAVENOUS

## 2022-02-12 MED ORDER — FUROSEMIDE 10 MG/ML IJ SOLN
40.0000 mg | Freq: Once | INTRAMUSCULAR | Status: AC
  Administered 2022-02-12: 40 mg via INTRAVENOUS
  Filled 2022-02-12: qty 4

## 2022-02-12 MED ORDER — EZETIMIBE 10 MG PO TABS
10.0000 mg | ORAL_TABLET | Freq: Every day | ORAL | Status: DC
  Administered 2022-02-13 – 2022-02-15 (×3): 10 mg via ORAL
  Filled 2022-02-12 (×3): qty 1

## 2022-02-12 MED ORDER — ONDANSETRON HCL 4 MG/2ML IJ SOLN: 4.0000 mg | Freq: Four times a day (QID) | INTRAMUSCULAR | Status: DC | PRN

## 2022-02-12 MED ORDER — DILTIAZEM HCL 25 MG/5ML IV SOLN
15.0000 mg | Freq: Once | INTRAVENOUS | Status: AC
Start: 2022-02-12 — End: 2022-02-12
  Administered 2022-02-12: 15 mg via INTRAVENOUS
  Filled 2022-02-12: qty 5

## 2022-02-12 MED ORDER — SENNOSIDES-DOCUSATE SODIUM 8.6-50 MG PO TABS: 1.0000 | ORAL_TABLET | Freq: Every evening | ORAL | Status: DC | PRN

## 2022-02-12 MED ORDER — ONDANSETRON HCL 4 MG PO TABS: 4.0000 mg | ORAL_TABLET | Freq: Four times a day (QID) | ORAL | Status: DC | PRN

## 2022-02-12 MED ORDER — ACETAMINOPHEN 325 MG PO TABS: 650.0000 mg | ORAL_TABLET | Freq: Four times a day (QID) | ORAL | Status: DC | PRN

## 2022-02-12 MED ORDER — CALCIUM CARBONATE 1250 (500 CA) MG PO TABS
1250.0000 mg | ORAL_TABLET | Freq: Every day | ORAL | Status: DC
  Administered 2022-02-13 – 2022-02-15 (×3): 1250 mg via ORAL
  Filled 2022-02-12 (×3): qty 1

## 2022-02-12 MED ORDER — FUROSEMIDE 10 MG/ML IJ SOLN
40.0000 mg | Freq: Two times a day (BID) | INTRAMUSCULAR | Status: DC
  Administered 2022-02-13 – 2022-02-14 (×3): 40 mg via INTRAVENOUS
  Filled 2022-02-12 (×3): qty 4

## 2022-02-12 MED ORDER — DILTIAZEM HCL 25 MG/5ML IV SOLN
10.0000 mg | Freq: Once | INTRAVENOUS | Status: AC
Start: 2022-02-12 — End: 2022-02-12
  Administered 2022-02-12: 10 mg via INTRAVENOUS
  Filled 2022-02-12: qty 5

## 2022-02-12 MED ORDER — ADULT MULTIVITAMIN W/MINERALS CH
1.0000 | ORAL_TABLET | Freq: Every day | ORAL | Status: DC
  Administered 2022-02-13 – 2022-02-15 (×3): 1 via ORAL
  Filled 2022-02-12 (×3): qty 1

## 2022-02-12 MED ORDER — TRAZODONE HCL 50 MG PO TABS: 25.0000 mg | ORAL_TABLET | Freq: Every evening | ORAL | Status: DC | PRN

## 2022-02-12 MED ORDER — ACETAMINOPHEN 650 MG RE SUPP: 650.0000 mg | Freq: Four times a day (QID) | RECTAL | Status: DC | PRN

## 2022-02-12 MED ORDER — METOPROLOL SUCCINATE ER 50 MG PO TB24
50.0000 mg | ORAL_TABLET | Freq: Every day | ORAL | Status: DC
  Administered 2022-02-13: 50 mg via ORAL
  Filled 2022-02-12: qty 1

## 2022-02-12 MED ORDER — POTASSIUM CHLORIDE CRYS ER 10 MEQ PO TBCR: 10.0000 meq | EXTENDED_RELEASE_TABLET | Freq: Every day | ORAL | Status: DC

## 2022-02-12 MED ORDER — DILTIAZEM HCL-DEXTROSE 125-5 MG/125ML-% IV SOLN (PREMIX): 5.0000 mg/h | INTRAVENOUS | Status: DC

## 2022-02-12 NOTE — Progress Notes (Signed)
Subjective:  Patient ID: Brandi [name], female    DOB: 06-27-1927  Age: 88 y.o. MRN: [id number]  CC: Diagnoses of Delirium, Enterococcus UTI, History of fall within past 90 days, Paroxysmal atrial fibrillation (HCC), and Age-related osteoporosis with current pathological fracture, sequela were pertinent to this visit.   HPI [facility] presents for 6 month follow up. However patient is confused,  tachycardic and accompanied by her daughter who arrived from out of town for her July 4 visit last evening  Chief Complaint  Patient presents with   Follow-up    6 month follow up on hypertension, hyperlipidemia   Per daughter,  patient was taken to Urgent Care last week by neighbors who found her confused at home   She was diagnosed with  UTI and sent home with  empiric rx for keflex which she has not filled.  Per review of Urgent Care note she was considered to be neurologically and mentally intact.  Today she is euphoric in demeanor, but states that she has not been urinating,  eating or drinking much.  She cannot recall the day or year.     Outpatient Medications Prior to Visit  Medication Sig Dispense Refill   apixaban (ELIQUIS) 2.5 MG TABS tablet TAKE 1 TABLET BY MOUTH TWICE  DAILY 180 tablet 1   CALCIUM 600 1500 (600 Ca) MG TABS tablet Take by mouth.     Calcium Carbonate-Vit D-Min (CALCIUM 1200 PO) Take 600 mg by mouth.     ezetimibe (ZETIA) 10 MG tablet TAKE 1 TABLET BY MOUTH DAILY 90 tablet 3   furosemide (LASIX) 20 MG tablet TAKE 1 TABLET BY MOUTH DAILY AS  NEEDED FOR FLUID RETENTION 90 tablet 3   metoprolol succinate (TOPROL-XL) 50 MG 24 hr tablet TAKE 1 TABLET BY MOUTH  DAILY WITH OR IMMEDIATELY  FOLLOWING A MEAL 90 tablet 1   Multiple Vitamin (MULTIVITAMIN) tablet Take 1 tablet by mouth daily.     potassium chloride (KLOR-CON M) 10 MEQ tablet TAKE 1 TABLET BY MOUTH  DAILY 90 tablet 3   Facility-Administered Medications Prior to Visit  Medication Dose Route Frequency Provider  Last Rate Last Admin   denosumab (PROLIA) injection 60 mg  60 mg Subcutaneous Q6 months [name], MD   60 mg at 02/27/20 1106    Review of Systems;  Patient denies headache, fevers, malaise, unintentional weight loss, skin rash, eye pain, sinus congestion and sinus pain, sore throat, dysphagia,  hemoptysis , cough, dyspnea, wheezing, chest pain, palpitations, orthopnea, edema, abdominal pain, nausea, melena, diarrhea, constipation, flank pain, dysuria, hematuria, urinary  Frequency, nocturia, numbness, tingling, seizures,  Focal weakness, Loss of consciousness,  Tremor, insomnia, depression, anxiety, and suicidal ideation.      Objective:  BP (!) 176/89 (BP Location: Left Arm, Patient Position: Sitting, Cuff Size: Normal)   Pulse (!) 191   Temp (!) 97.3 F (36.3 C) (Oral)   Ht 4\' 11"  (1.499 m)   Wt 88 lb 12.8 oz (40.3 kg)   SpO2 97%   BMI 17.94 kg/m   BP Readings from Last 3 Encounters:  02/12/22 (!) 176/89  02/04/22 (!) 152/78  01/21/22 140/70    Wt Readings from Last 3 Encounters:  02/12/22 88 lb 12.8 oz (40.3 kg)  01/21/22 93 lb 2 oz (42.2 kg)  12/05/21 99 lb 3.2 oz (45 kg)    General appearance: alert, cooperative and appears stated age Ears: normal TM's and external ear canals both ears Throat: lips, mucosa,  and tongue normal; teeth and gums normal Neck: no adenopathy, no carotid bruit, supple, symmetrical, trachea midline and thyroid not enlarged, symmetric, no tenderness/mass/nodules Back: symmetric, no curvature. ROM normal. No CVA tenderness. Lungs: clear to auscultation bilaterally Heart: irregularly irregular rate  Grade 3 systolic murmur  no murmur, click, rub or gallop Abdomen: soft, non-tender; bowel sounds normal; no masses,  no organomegaly Pulses: 2+ and symmetric Skin: Skin color, texture, turgor normal. No rashes or lesions Lymph nodes: Cervical, supraclavicular, and axillary nodes normal. Neuro:  disoriented, but answering appropriately   Lab  Results  Component Value Date   HGBA1C 5.1 02/17/2020   HGBA1C 5.5 05/22/2017    Lab Results  Component Value Date   CREATININE 1.17 (H) 01/21/2022   CREATININE 1.05 08/14/2021   CREATININE 1.02 02/12/2021    Lab Results  Component Value Date   WBC 6.6 02/12/2021   HGB 13.4 02/12/2021   HCT 40.8 02/12/2021   PLT 190.0 02/12/2021   GLUCOSE 112 (H) 01/21/2022   CHOL 165 08/14/2021   TRIG 160.0 (H) 08/14/2021   HDL 46.60 08/14/2021   LDLDIRECT 91.0 10/31/2016   LDLCALC 87 08/14/2021   ALT 17 08/14/2021   AST 30 08/14/2021   NA 140 01/21/2022   K 4.2 01/21/2022   CL 102 01/21/2022   CREATININE 1.17 (H) 01/21/2022   BUN 18 01/21/2022   CO2 29 01/21/2022   TSH 2.25 03/09/2020   HGBA1C 5.1 02/17/2020    No results found.  Assessment & Plan:   Problem List Items Addressed This Visit     Atrial fibrillation (HCC)    Uncontrolled currently,  Due to dehydration vs lack of medication due to confusion       Osteoporosis    Thoracic and lumbar films done in April 2023 after an unwitnessed fall at home note multiple vertebral fractures of undetermined chronicity.  She denies pain currently       Enterococcus UTI    Untreated due to patient's confusion.      Delirium    She appears dehydrated and  Confused.   Given her untreated Enterococcus UTI I am concerned that her irregular pulse and tachycardia are indicative of sepsis ;  I am sending her to the ER for admission.        History of fall within past 90 days    She was evaluated by [facility]MS on April 20 after a fall at home resulting in blunt head and spine trauma.   CT head was negative for SDH and other acute changes.         I spent a total of  with this patient in a face to face visit on the date of this encounter reviewing the last office visit with   TMS on April 19    most recent with patient's cardiologist , most recent imaging study ,   and post visit ordering of testing and therapeutics.     Follow-up: No follow-ups on file.   [name], MD

## 2022-02-12 NOTE — Assessment & Plan Note (Addendum)
Most likely demand ischemia.  Patient does not have chest pain or acute ischemic changes on EKG.

## 2022-02-12 NOTE — Assessment & Plan Note (Signed)
Uncontrolled currently,  Due to dehydration vs lack of medication due to confusion

## 2022-02-12 NOTE — Assessment & Plan Note (Signed)
Untreated due to patient's confusion.

## 2022-02-12 NOTE — Assessment & Plan Note (Signed)
She appears dehydrated and  Confused.   Given her untreated Enterococcus UTI I am concerned that her irregular pulse and tachycardia are indicative of sepsis ;  I am sending her to the ER for admission.

## 2022-02-12 NOTE — H&P (Addendum)
[place]   PATIENT NAME: Brandi [name]    MR#:  [id number]  DATE OF BIRTH:  15-May-1927  DATE OF ADMISSION:  02/12/2022  PRIMARY CARE PHYSICIAN: Brandi Shams, MD   Patient is coming from: Home  REQUESTING/REFERRING PHYSICIAN: Georga Hacking, MD  CHIEF COMPLAINT:   Chief Complaint  Patient presents with   Dehydration    HISTORY OF PRESENT ILLNESS:  Brandi [name] is a 88 y.o. Caucasian female with medical history significant for essential hypertension, dyslipidemia, moderate to severe mitral regurgitation and paroxysmal atrial fibrillation on Eliquis, who presented to the ER with acute onset of altered mental status with confusion per her family which has been worsening over the last couple of days.  She was seen at urgent care on 6/20 and diagnosed with UTI.  She was prescribed antibiotic therapy.  Her daughter who comes to visit her from [place] and the patient apparently has not taken it.  No nausea or vomiting or abdominal pain.  She denies any dysuria, oliguria or hematuria or flank pain.  She admits to dyspnea on exertion without paroxysmal nocturnal dyspnea.  No worsening lower extremity edema.  She has been having mild orthopnea.  No chest pain or palpitations.  ED Course: When she came to the ER BP was 176/89 with a heart rate of 191 in atrial fibrillation and after IV Cardizem boluses came down to 86.  Pulse oximetry was 95 to 90% on room air.  Labs revealed a potassium of 5.4 and a BUN of 28 with a creatinine 1.32.  BNP was elevated at 1170.7 and high sensitive troponin was 99.  Lactic acid was 2.7 CBC showed leukocytosis of 11.1 with neutrophilia.  UA showed moderate leukocytes, negative nitrite and rare bacteria.  Urine culture was sent. EKG as reviewed by me : EKG showed atrial fibrillation with rapid ventricular sponsor 151 with right axis deviation, biventricular hypertrophy. Imaging: Portable chest ray showed cardiomegaly with central pulmonary vascular prominence  and mild interstitial edema with small bilateral pleural effusions.  Noncontrasted CT scan revealed right sphenoid sinusitis with no acute intercranial normality.  The patient was given 10 mg of IV Cardizem bolus followed by another 15 mg IV bolus, 60 mg of p.o. Cardizem, 40 mg of IV Lasix.  She was initially given 400 mL IV normal saline bolus before chest x-ray and lab results.  She will be admitted to a progressive unit bed for further evaluation and management. PAST MEDICAL HISTORY:   Past Medical History:  Diagnosis Date   Essential hypertension    Hyperlipidemia    Mitral valve prolapse    Moderate to Severe Mitral regurgitation    a. 07/2018 Echo: EF 65-70%. Mild AS. Mild MVP w/ mild MS and mod-sev MR. Sev dil LA. Midly dil RA. PASP 40-88mmHg.   PAF (paroxysmal atrial fibrillation) (HCC)    a. CHA2DS2VASc = 4-->Eliquis 2.5mg  BID; b. 08/2019 Event Monitor: 100% Afib (52-199, avg 96). 4 runs of VT vs afib w/ aberrancy.   PAH (pulmonary artery hypertension) (HCC)    a. 07/2018 Echo: PASP 40-71mmHg in setting of mild MS and mod-sev MR.    PAST SURGICAL HISTORY:   Past Surgical History:  Procedure Laterality Date   BIOPSY BREAST  1980   left ,  benign   BREAST BIOPSY  1980   normal   CATARACT EXTRACTION W/PHACO Left 05/01/2020   Procedure: CATARACT EXTRACTION PHACO AND INTRAOCULAR LENS PLACEMENT (IOC) LEFT 6.81 00:48.8;  Surgeon: Brandi [name],  Brandi Noa, MD;  Location[facility];  Service: Ophthalmology;  Laterality: Left;   CATARACT EXTRACTION W/PHACO Right 05/22/2020   Procedure: CATARACT EXTRACTION PHACO AND INTRAOCULAR LENS PLACEMENT (IOC) RIGHT 7.41 00:43.3;  Surgeon: Galen Manila, MD;  Location: [facility];  Service: Ophthalmology;  Laterality: Right;   Hospitalized for endocarditis  1987    SOCIAL HISTORY:   Social History   Tobacco Use   Smoking status: Never   Smokeless tobacco: Never  Substance Use Topics   Alcohol use: No    FAMILY HISTORY:    Family History  Problem Relation Age of Onset   Heart attack Father    Coronary artery disease Father    Cancer Sister        esophageal   Heart disease Brother     DRUG ALLERGIES:   Allergies  Allergen Reactions   Codeine     REACTION: N \\T \ V   Statins     REACTION: INTOLERANCE: muscle aches   Sulfonamide Derivatives     REACTION: N \\T \ V    REVIEW OF SYSTEMS:   ROS As per history of present illness. All pertinent systems were reviewed above. Constitutional, HEENT, cardiovascular, respiratory, GI, GU, musculoskeletal, neuro, psychiatric, endocrine, integumentary and hematologic systems were reviewed and are otherwise negative/unremarkable except for positive findings mentioned above in the HPI.   MEDICATIONS AT HOME:   Prior to Admission medications   Medication Sig Start Date End Date Taking? Authorizing Provider  apixaban (ELIQUIS) 2.5 MG TABS tablet TAKE 1 TABLET BY MOUTH TWICE  DAILY 12/24/21   Antonieta Iba, MD  CALCIUM 600 1500 (600 Ca) MG TABS tablet Take by mouth. 04/13/20   [provider]  Calcium Carbonate-Vit D-Min (CALCIUM 1200 PO) Take 600 mg by mouth.    [provider]  ezetimibe (ZETIA) 10 MG tablet TAKE 1 TABLET BY MOUTH DAILY 12/24/21   Antonieta Iba, MD  furosemide (LASIX) 20 MG tablet TAKE 1 TABLET BY MOUTH DAILY AS  NEEDED FOR FLUID RETENTION 12/24/21   Antonieta Iba, MD  metoprolol succinate (TOPROL-XL) 50 MG 24 hr tablet TAKE 1 TABLET BY MOUTH  DAILY WITH OR IMMEDIATELY  FOLLOWING A MEAL 09/04/21   Antonieta Iba, MD  Multiple Vitamin (MULTIVITAMIN) tablet Take 1 tablet by mouth daily.    [provider]  potassium chloride (KLOR-CON M) 10 MEQ tablet TAKE 1 TABLET BY MOUTH  DAILY 12/24/21   Antonieta Iba, MD      VITAL SIGNS:  Blood pressure (!) 151/98, pulse (!) 127, temperature 97.6 F (36.4 C), resp. rate 18, SpO2 97 %.  PHYSICAL EXAMINATION:  Physical Exam  GENERAL:  88 y.o.-year-old cachectic  Caucasian female patient lying in the bed with no acute distress.  EYES: Pupils equal, round, reactive to light and accommodation. No scleral icterus. Extraocular muscles intact.  HEENT: Head atraumatic, normocephalic. Oropharynx and nasopharynx clear.  NECK:  Supple, no jugular venous distention. No thyroid enlargement, no tenderness.  LUNGS: Diminished bibasilar breath sounds with bibasilar rales.  No use of accessory muscles of respiration.  CARDIOVASCULAR: Irregularly irregular tachycardic rhythm, S1, S2 normal.  2/6 to 3/6 systolic murmur with no rubs, or gallops.  ABDOMEN: Soft, nondistended, nontender. Bowel sounds present. No organomegaly or mass.  EXTREMITIES: No pedal edema, cyanosis, or clubbing.  NEUROLOGIC: Cranial nerves II through XII are intact. Muscle strength 5/5 in all extremities. Sensation intact. Gait not checked.  PSYCHIATRIC: The patient is alert and oriented x 3.  Normal affect and good eye contact. SKIN: No obvious rash, lesion, or ulcer.   LABORATORY PANEL:   CBC Recent Labs  Lab 02/12/22 1611  WBC 11.1*  HGB 13.7  HCT 43.6  PLT 390   ------------------------------------------------------------------------------------------------------------------  Chemistries  Recent Labs  Lab 02/12/22 1611  NA 135  K 5.4*  CL 101  CO2 26  GLUCOSE 131*  BUN 28*  CREATININE 1.32*  CALCIUM 9.3  AST 59*  ALT 22  ALKPHOS 73  BILITOT 0.9   ------------------------------------------------------------------------------------------------------------------  Cardiac Enzymes No results for input(s): "TROPONINI" in the last 168 hours. ------------------------------------------------------------------------------------------------------------------  RADIOLOGY:  CT HEAD WO CONTRAST ( )  Result Date: 02/12/2022 CLINICAL DATA:  Mental status change EXAM: CT HEAD WITHOUT CONTRAST TECHNIQUE: Contiguous axial images were obtained from the base of the skull through the  vertex without intravenous contrast. RADIATION DOSE REDUCTION: This exam was performed according to the departmental dose-optimization program which includes automated exposure control, adjustment of the mA and/or kV according to patient size and/or use of iterative reconstruction technique. COMPARISON:  Head CT dated December 13, 2021 FINDINGS: Brain: Chronic white matter ischemic change and generalized atrophy. No evidence of acute infarction, hemorrhage, hydrocephalus, extra-axial collection or mass lesion/mass effect. Vascular: No hyperdense vessel or unexpected calcification. Skull: Normal. Negative for fracture or focal lesion. Sinuses/Orbits: Air-fluid level of the right sphenoid sinus. No acute abnormality. Other: None. IMPRESSION: 1. No acute intracranial abnormality. 2. Right sphenoid sinusitis. Electronically Signed   By: Allegra Lai M.D.   On: 02/12/2022 16:37   DG Chest Port 1 View  Result Date: 02/12/2022 CLINICAL DATA:  Dehydration, UTI EXAM: PORTABLE CHEST 1 VIEW COMPARISON:  02/16/2020 FINDINGS: Transverse diameter of heart is increased. Central pulmonary vessels are prominent. There is slight prominence of interstitial markings in the parahilar regions and lower lung fields. There is blunting of lateral CP angles. There is no pneumothorax. Low position of diaphragms suggests COPD. IMPRESSION: Cardiomegaly. Central pulmonary vessels are prominent. There is prominence of interstitial markings in the parahilar regions and lower lung fields suggesting mild interstitial edema or interstitial pneumonia. Small bilateral pleural effusions. Electronically Signed   By: Ernie Avena M.D.   On: 02/12/2022 16:27      IMPRESSION AND PLAN:  Assessment and Plan: * Acute on chronic diastolic CHF (congestive heart failure) (HCC) - The patient was admitted to the progressive unit bed. - This is  acute on chronic diastolic CHF given previous normal EF of 60 to 65% in July 2021 with grade 1  diastolic dysfunction and moderate mitral regurgitation with mild aortic valve stenosis.. - She will be diuresed with IV Lasix. - We will monitor her potassium given her mild hyperkalemia. - Potassium supplementation will be held off. - 2D echo and cardiology consult will be obtained. - I notified Dr. Gala Romney about the patient. - We will attempt controlling her atrial fibrillation as well as conversion to normal sinus rhythm. - We will continue Toprol-XL.  Atrial fibrillation with rapid ventricular response (HCC) - The patient was placed on p.o. Cardizem. - We will utilize IV Cardizem bolus and drip as needed for persistent tachycardia. - 2D echo and cardiology consult will be obtained. - I will continue her Eliquis and Toprol-XL. - This is likely contributing to her acute CHF.  Uncontrolled hypertension - This could be contributing to her acute CHF. - We will continue Cardizem and Toprol-XL. - We will utilize as needed IV labetalol as needed  Acute encephalopathy - This could be related to  hypoxia. - You may need a cath UA and urine culture and sensitivity given her recent UTI that could be contributing to her altered mental status. - She is currently alert and oriented to time, place and person. - We will continue to monitor neurochecks every 4 hours for 24 hours. - Her noncontrasted CT scan was negative for acute CVA.  Elevated troponin I level - This could be related to her atrial fibrillation and acute CHF as a demand ischemia. - We will follow serial troponins. - 2D echo and cardiology consult will be obtained as mentioned above. - The patient has not had any chest pain.  Dyslipidemia - We will continue Zetia.   DVT prophylaxis: Eliquis. Advanced Care Planning:  Code Status: The patient is DNR/DNI.   Family Communication:  The plan of care was discussed in details with the patient (and family). I answered all questions. The patient agreed to proceed with the above  mentioned plan. Further management will depend upon hospital course. Disposition Plan: Back to previous home environment Consults called: Cardiology.   All the records are reviewed and case discussed with ED provider.  Status is: Inpatient  At the time of the admission, it appears that the appropriate admission status for this patient is inpatient.  This is judged to be reasonable and necessary in order to provide the required intensity of service to ensure the patient's safety given the presenting symptoms, physical exam findings and initial radiographic and laboratory data in the context of comorbid conditions.  The patient requires inpatient status due to high intensity of service, high risk of further deterioration and high frequency of surveillance required.  I certify that at the time of admission, it is my clinical judgment that the patient will require inpatient hospital care extending more than 2 midnights.                            Dispo: The patient is from: Home              Anticipated d/c is to: Home              Patient currently is not medically stable to d/c.              Difficult to place patient: No  Hannah Beat M.D on 02/12/2022 at 10:42 PM  [facility]   From 7 PM-7 AM, contact night-coverage [email or web address]  CC: Primary care physician; Brandi Shams, MD

## 2022-02-12 NOTE — Assessment & Plan Note (Addendum)
Presented in A-fib RVR with heart rate up to 191, improved with IV Cardizem bolus and short acting oral Cardizem.  6/30: Heart rates have improved but elevated to 133 with ambulation, improved to 110s at rest --Cardiology consulted -- Started on Cardizem 30 mg every 6 hours, since discontinued -- Continue Toprol-XL 50 mg increased to twice daily --Continue Eliquis 2.5 mg twice daily -- Monitor on telemetry -- Monitor and replace electrolytes

## 2022-02-12 NOTE — Assessment & Plan Note (Addendum)
BPs in the ED were 150/79, up to 157/114.  BPs have been rather labile but fairly well controlled today. -- Continue Toprol-XL. - As needed IV labetalol as needed

## 2022-02-12 NOTE — ED Provider Notes (Signed)
[facility] Provider Note    Event Date/Time   First MD Initiated Contact with Patient 02/12/22 1545     (approximate)   History   Dehydration   HPI  Brandi [name] is a 88 y.o. female   with paroxysmal atrial fibrillation on Eliquis, mitral regurgitation, pulmonary hypertension presents with confusion.  Patient was seen at urgent care 8 days ago for similar and was prescribed Keflex for UTI however she did not fill the prescription.  Patient lives alone her neighbors have been checking on her and they are the ones who brought her to urgent care initially.  Her stepdaughter is here from [place] got into town last night and notes that she is much more confused than normally.  She is normally able to take care of all her ADLs goes to the gym and exercises and drive still.  She does not think she has been eating.  Patient has really no complaints currently denies pain including chest pain abdominal pain she denies shortness of breath nausea vomiting diarrhea or dysuria.  No fevers.  She is does say she has not been eating much.     Past Medical History:  Diagnosis Date   Essential hypertension    Hyperlipidemia    Mitral valve prolapse    Moderate to Severe Mitral regurgitation    a. 07/2018 Echo: EF 65-70%. Mild AS. Mild MVP w/ mild MS and mod-sev MR. Sev dil LA. Midly dil RA. PASP 40-75mmHg.   PAF (paroxysmal atrial fibrillation) (HCC)    a. CHA2DS2VASc = 4-->Eliquis 2.5mg  BID; b. 08/2019 Event Monitor: 100% Afib (52-199, avg 96). 4 runs of VT vs afib w/ aberrancy.   PAH (pulmonary artery hypertension) (HCC)    a. 07/2018 Echo: PASP 40-54mmHg in setting of mild MS and mod-sev MR.    Patient Active Problem List   Diagnosis Date Noted   Delirium 02/12/2022   Enterococcus UTI 02/12/2022   History of fall within past 90 days 02/12/2022   Osteopenia 01/10/2022   Aortic atherosclerosis (HCC) 01/10/2022   Myalgia due to statin 08/14/2021   Cognitive attention  deficit 02/13/2021   Hospital discharge follow-up 02/28/2020   Abnormal intentional weight loss 02/27/2020   HLD (hyperlipidemia) 02/16/2020   Acquired thrombophilia (HCC) 02/10/2020   Dupuytren's contracture of right hand 11/06/2019   Atrial fibrillation (HCC) 11/06/2019   Close exposure to COVID-19 virus 01/25/2019   Long-term current use of high risk medication other than anticoagulant 05/24/2017   Aortic valve stenosis 11/09/2016   Mitral regurgitation 11/09/2016   Pulmonary HTN (HCC) 11/09/2016   Closed displaced fracture of olecranon process with intraarticular extension of right ulna 07/06/2014   Dry mouth 01/31/2014   Encounter for preventive health examination 01/26/2013   Edema 06/26/2011   Essential hypertension 07/24/2009   Chronic venous insufficiency 01/30/2009   Herpes zoster 10/12/2008   Hyperlipidemia 10/12/2008   HEARING LOSS, TINNITUS 10/12/2008   Osteoporosis 10/12/2008     Physical Exam  Triage Vital Signs: ED Triage Vitals  Enc Vitals Group     BP 02/12/22 1529 (!) 150/79     Pulse Rate 02/12/22 1529 (!) 149     Resp 02/12/22 1529 18     Temp --      Temp src --      SpO2 02/12/22 1529 95 %     Weight --      Height --      Head Circumference --      Peak  Flow --      Pain Score 02/12/22 1531 0     Pain Loc --      Pain Edu? --      Excl. in GC? --     Most recent vital signs: Vitals:   02/12/22 1800 02/12/22 1830  BP: (!) 144/93 133/80  Pulse: 62 64  Resp: (!) 27 (!) 31  SpO2: 96% 94%     General: Awake, no distress.  CV:  Good peripheral perfusion.  No peripheral edema Resp:  Normal effort.  No increased work of breathing Abd:  No distention.  Soft nontender Neuro:             Awake, Alert, Oriented x 3  Other:  Patient moves all extremities symmetric pupils equal round reactive to light and accommodation Patient is very hard of hearing She is intermittently confused not making sense but then has moments of lucidity   ED Results  / Procedures / Treatments  Labs (all labs ordered are listed, but only abnormal results are displayed) Labs Reviewed  LACTIC ACID, PLASMA - Abnormal; Notable for the following components:      Result Value   Lactic Acid, Venous 2.7 (*)    All other components within normal limits  COMPREHENSIVE METABOLIC PANEL - Abnormal; Notable for the following components:   Potassium 5.4 (*)    Glucose, Bld 131 (*)    BUN 28 (*)    Creatinine, Ser 1.32 (*)    Albumin 3.1 (*)    AST 59 (*)    GFR, Estimated 38 (*)    All other components within normal limits  CBC WITH DIFFERENTIAL/PLATELET - Abnormal; Notable for the following components:   WBC 11.1 (*)    Neutro Abs 9.0 (*)    Abs Immature Granulocytes 0.09 (*)    All other components within normal limits  URINALYSIS, ROUTINE W REFLEX MICROSCOPIC - Abnormal; Notable for the following components:   Color, Urine YELLOW (*)    APPearance CLEAR (*)    Leukocytes,Ua MODERATE (*)    Bacteria, UA RARE (*)    All other components within normal limits  BRAIN NATRIURETIC PEPTIDE - Abnormal; Notable for the following components:   B Natriuretic Peptide 1,170.7 (*)    All other components within normal limits  TROPONIN I (HIGH SENSITIVITY) - Abnormal; Notable for the following components:   Troponin I (High Sensitivity) 99 (*)    All other components within normal limits  CULTURE, BLOOD (ROUTINE X 2)  CULTURE, BLOOD (ROUTINE X 2)  URINE CULTURE  LACTIC ACID, PLASMA     EKG  EKG interpreted myself shows atrial fibrillation with RVR diffuse ST depression   RADIOLOGY  Chest x-ray interpreted by myself shows cardiomegaly with pulmonary vascular congestion  PROCEDURES:  Critical Care performed: Yes, see critical care procedure note(s)  .1-3 Lead EKG Interpretation  Performed by: Georga Hacking, MD Authorized by: Georga Hacking, MD     Interpretation: abnormal     ECG rate assessment: tachycardic     Rhythm: atrial fibrillation      Ectopy: none     Conduction: normal   .Critical Care  Performed by: Georga Hacking, MD Authorized by: Georga Hacking, MD   Critical care provider statement:    Critical care time (minutes):  45   Critical care was time spent personally by me on the following activities:  Development of treatment plan with patient or surrogate, discussions with consultants, evaluation of patient's response  to treatment, examination of patient, ordering and review of laboratory studies, ordering and review of radiographic studies, ordering and performing treatments and interventions, pulse oximetry, re-evaluation of patient's condition and review of old charts   The patient is on the cardiac monitor to evaluate for evidence of arrhythmia and/or significant heart rate changes.   MEDICATIONS ORDERED IN ED: Medications  diltiazem (CARDIZEM) tablet 60 mg (has no administration in time range)  sodium chloride 0.9 % bolus 1,000 mL (0 mLs Intravenous Stopped 02/12/22 1759)  diltiazem (CARDIZEM) injection 10 mg (10 mg Intravenous Given 02/12/22 1717)  furosemide (LASIX) injection 40 mg (40 mg Intravenous Given 02/12/22 1822)  diltiazem (CARDIZEM) injection 15 mg (15 mg Intravenous Given 02/12/22 1904)     IMPRESSION / MDM / ASSESSMENT AND PLAN / ED COURSE  I reviewed the triage vital signs and the nursing notes.                              Patient's presentation is most consistent with acute presentation with potential threat to life or bodily function.  Differential diagnosis includes, but is not limited to, pneumonia, dehydration, UTI, CHF  Patient is a 88 year old female with mitral regurgitation atrial fibrillation presents with altered mental status.  Has had increasing confusion over the last 2 weeks.  Diagnosed with UTI at outpatient appointment a week ago but did not follow-up for the antibiotic.  Her daughter came from [place] yesterday and notes that she is not at her baseline is more confused  not eating.  She is in A-fib with RVR on arrival heart rates in the 150s.  EKG showing diffuse ST depression.  Blood pressures okay and her sat is okay.  Patient actually looks well but she is somewhat confused.  Family notes this is not her baseline.  Neurologic exam is nonfocal however.  CT of her head is without acute findings.  Chest x-ray actually looks like some volume overload.  And labs are notable for an elevated troponin elevated BNP.  Initially had ordered her a liter of fluid for possible dehydration given she has not been taking p.o. however when I saw these numbers and chest x-ray I turned off the fluid she had received about 500 cc of this.  She got 10 of diltiazem heart rates improved to the 1 teens we will give another dose.  Fattening is around baseline.  Potassium 5.4 but this is hemolyzed.  She has mild leukocytosis hemoglobin is okay.  Suspect that her CHF is driving her altered mental status.  Will give Lasix and continue rate control and admit to the hospital service.  After first dose of 10 of Dilt heart rates in the 1 teens.  Second dose given heart rates now in the 90s will follow with a 60 p.o. do not.  Patient remains well-appearing.  She is given IV Lasix.  Discussed with the hospitalist for admission.      FINAL CLINICAL IMPRESSION(S) / ED DIAGNOSES   Final diagnoses:  Atrial fibrillation with RVR (HCC)  Congestive heart failure, unspecified HF chronicity, unspecified heart failure type (HCC)     Rx / DC Orders   ED Discharge Orders     None        Note:  This document was prepared using Dragon voice recognition software and may include unintentional dictation errors.   Georga Hacking, MD 02/12/22 Serena Croissant

## 2022-02-12 NOTE — Assessment & Plan Note (Addendum)
Acute decompensation most likely due to A-fib with RVR previous echo showed normal EF of 60 to 65%, grade 2 diastolic dysfunction, severely elevated PASP, mild to moderate MR, mild AS. Started on IV Lasix on admission -- Cardiology consulted -- Treated with IV Lasix --Transitioned to oral Lasix 40 mg daily -- Toprol-XL, increased to 75 mg BID -- Monitor renal function and electrolytes at follow up -- Daily weights at home

## 2022-02-12 NOTE — Assessment & Plan Note (Signed)
Continue Zetia. °

## 2022-02-12 NOTE — Assessment & Plan Note (Signed)
-   This could be related to hypoxia. - You may need a cath UA and urine culture and sensitivity given her recent UTI that could be contributing to her altered mental status. - She is currently alert and oriented to time, place and person. - We will continue to monitor neurochecks every 4 hours for 24 hours. - Her noncontrasted CT scan was negative for acute CVA.

## 2022-02-12 NOTE — Assessment & Plan Note (Deleted)
-   The patient was admitted to the progressive unit bed. - This is likely acute on chronic diastolic CHF given previous normal EF of 60 to 65% in July 2021 with grade 1 diastolic dysfunction and moderate mitral regurgitation with mild aortic valve stenosis.. - She will be diuresed with IV Lasix. - We will monitor her potassium given her mild hyperkalemia. - Potassium supplementation will be held off. - 2D echo and cardiology consult will be obtained. - I notified Dr. Gala Romney about the patient. - We will attempt controlling her atrial fibrillation as well as conversion to normal sinus rhythm. - We will continue Toprol-XL.

## 2022-02-12 NOTE — Assessment & Plan Note (Signed)
She was evaluated by [facility] on April 20 after a fall at home resulting in blunt head and spine trauma.   CT head was negative for SDH and other acute changes.

## 2022-02-12 NOTE — ED Triage Notes (Signed)
Pt arrives from PCP for treatment for dehydration and UTI. Per family member, pts has a UTI and has not had treatment for it. Per family member, pt is confused.

## 2022-02-12 NOTE — Assessment & Plan Note (Signed)
Thoracic and lumbar films done in April 2023 after an unwitnessed fall at home note multiple vertebral fractures of undetermined chronicity.  She denies pain currently

## 2022-02-13 ENCOUNTER — Encounter: Payer: Self-pay | Admitting: Family Medicine

## 2022-02-13 ENCOUNTER — Inpatient Hospital Stay (HOSPITAL_COMMUNITY)
Admit: 2022-02-13 | Discharge: 2022-02-13 | Disposition: A | Discharge status: Home / Self Care | Payer: Medicare Other | Attending: Physician Assistant | Admitting: Physician Assistant

## 2022-02-13 DIAGNOSIS — I5033 Acute on chronic diastolic (congestive) heart failure: Secondary | ICD-10-CM | POA: Diagnosis not present

## 2022-02-13 DIAGNOSIS — I5031 Acute diastolic (congestive) heart failure: Secondary | ICD-10-CM

## 2022-02-13 DIAGNOSIS — Z974 Presence of external hearing-aid: Secondary | ICD-10-CM

## 2022-02-13 DIAGNOSIS — E43 Unspecified severe protein-calorie malnutrition: Secondary | ICD-10-CM | POA: Diagnosis present

## 2022-02-13 DIAGNOSIS — I4891 Unspecified atrial fibrillation: Secondary | ICD-10-CM | POA: Diagnosis not present

## 2022-02-13 LAB — CBC
HCT: 40.8 % (ref 36.0–46.0)
Hemoglobin: 13.4 g/dL (ref 12.0–15.0)
MCH: 31.8 pg (ref 26.0–34.0)
MCHC: 32.8 g/dL (ref 30.0–36.0)
MCV: 96.7 fL (ref 80.0–100.0)
Platelets: 324 10*3/uL (ref 150–400)
RBC: 4.22 MIL/uL (ref 3.87–5.11)
RDW: 14.6 % (ref 11.5–15.5)
WBC: 10.1 10*3/uL (ref 4.0–10.5)
nRBC: 0 % (ref 0.0–0.2)

## 2022-02-13 LAB — ECHOCARDIOGRAM COMPLETE
AR max vel: 1.47 cm2
AV Area VTI: 1.72 cm2
AV Area mean vel: 1.37 cm2
AV Mean grad: 12 mmHg
AV Peak grad: 21.7 mmHg
Ao pk vel: 2.33 m/s
Area-P 1/2: 3.17 cm2
Height: 59 in
MV VTI: 1.13 cm2
P 1/2 time: 327 msec
S' Lateral: 2.44 cm
Weight: 1403.2 oz

## 2022-02-13 LAB — BASIC METABOLIC PANEL
Anion gap: 4 — ABNORMAL LOW (ref 5–15)
BUN: 21 mg/dL (ref 8–23)
CO2: 28 mmol/L (ref 22–32)
Calcium: 8.5 mg/dL — ABNORMAL LOW (ref 8.9–10.3)
Chloride: 106 mmol/L (ref 98–111)
Creatinine, Ser: 1 mg/dL (ref 0.44–1.00)
GFR, Estimated: 53 mL/min — ABNORMAL LOW (ref >60)
Glucose, Bld: 95 mg/dL (ref 70–99)
Potassium: 3.8 mmol/L (ref 3.5–5.1)
Sodium: 138 mmol/L (ref 135–145)

## 2022-02-13 LAB — TROPONIN I (HIGH SENSITIVITY): Troponin I (High Sensitivity): 78 ng/L — ABNORMAL HIGH (ref <18)

## 2022-02-13 MED ORDER — ENSURE ENLIVE PO LIQD
237.0000 mL | Freq: Two times a day (BID) | ORAL | Status: DC
  Administered 2022-02-13 – 2022-02-15 (×4): 237 mL via ORAL

## 2022-02-13 MED ORDER — DILTIAZEM HCL 30 MG PO TABS
30.0000 mg | ORAL_TABLET | Freq: Four times a day (QID) | ORAL | Status: DC
  Administered 2022-02-13 – 2022-02-14 (×4): 30 mg via ORAL
  Filled 2022-02-13 (×4): qty 1

## 2022-02-13 NOTE — Assessment & Plan Note (Addendum)
Patient's left hearing aid tip became detached and is lodged in patient's ear.  Visualized on otoscope.  Not causing any pain or issues. -- ENT consulted for assistance. On their exam, no foreign body was seen -- Outpatient follow-up

## 2022-02-13 NOTE — Hospital Course (Signed)
88 y.o. female with medical history significant for essential hypertension, dyslipidemia, moderate to severe mitral regurgitation and paroxysmal atrial fibrillation on Eliquis, who presented to the ER on 02/12/2022 for evaluation of altered mental status with confusion and forgetfulness per her family which had been worsening over the last couple of days.  She was seen at urgent care on 6/20 and diagnosed with UTI, reports taking all the antibiotics.  Daughter visiting from [place] reported on admission she may not have taken it.  Patient denies fever chills, dysuria or frequency, flank pain abdominal pain.  In the ED, patient was in A-fib with RVR initially heart rate as high as 191.  She was treated with IV Cardizem boluses with improvement of heart rate into the 80s.  Urinalysis with some signs of infection including moderate leukocytes and rare bacteria.  Patient was started on empiric IV Rocephin pending urine cultures.

## 2022-02-13 NOTE — Consult Note (Signed)
   Heart Failure Nurse Navigator Note  HFpEF 60 to 65%.  Mild LVH.  Grade 1 diastolic dysfunction.  Moderate left atrial enlargement.  Moderate to severe mitral regurgitation.  Moderate to severe tricuspid regurgitation.  Mild aortic stenosis.  Echocardiogram to be performed on this admission results are pending.  She presented to the emergency room with confusion and orthopnea.  She was noted to have a BNP of 1170.  Chest x-ray revealed cardiomegaly with prominent pulmonary vessels.  Small bilateral pleural effusions  Comorbidities:  Hypertension Hyperlipidemia Mitral valve prolapse Pulmonary artery hypertension Paroxysmal atrial fibrillation   Medications:  Apixaban 2.5 mg daily Diltiazem 30 mg every 6 hours Zetia 10 mg daily Furosemide 40 mg IV every 12 hours Metoprolol succinate 50 mg daily  Labs:  Sodium 138, potassium 3.8, chloride 106, CO2 28, BUN 21, creatinine 1.0, estimated GFR 53 Weight is 39.8 kg Blood pressure 128/90  Intake not documented Output 500 mL   Initial meeting with patient, she was sitting up in the chair at bedside in no acute distress.  There were no family members present at the time.  She states that she lives at [facility] in her own little house and she lives alone.  She does not weigh herself on a daily basis.  Discussed daily weights and reporting 3 pound weight gain overnight or 5 pounds within the week.  Also went over changes in symptoms to report.  In discussing her diet she states it is very hard to cook for 1 one-person.  She does admit to using convenience frozen dinners, discussed reading labels and making wiser choices of the meals can contain less than 600 mg of sodium per meal.  Discussed fluid restriction, patient states that she feels she does not drink enough during the day, she hates drinking water.  We will have 1-1 and half cups coffee in the morning and then 1 cup with dinner.  And an occasional glass of juice.  But when we  discussed the limit of 64 ounces in a days time she did not feel she came anywhere near that.  Discussed following up in the outpatient heart failure clinic after discharge.  She has an appointment for February 21, 2022 at 2 PM.  She was given the living with heart failure teaching booklet, zone magnet, info on heart failure and low-sodium along with weight chart.  She had no further questions/  Tresa Endo RN [facility]

## 2022-02-13 NOTE — Assessment & Plan Note (Addendum)
Present on admission likely due to UTI. Noncontrast head CT was negative. Improved with treatment of UTI. -- Manage UTI as outlined -- Delirium precautions -- Avoid sedating medications

## 2022-02-13 NOTE — Progress Notes (Addendum)
Progress Note   Patient: Brandi [name] OFB:[id number] DOB: 12-26-1926 DOA: 02/12/2022     1 DOS: the patient was seen and examined on 02/13/2022   Brief hospital course: 88 y.o. female with medical history significant for essential hypertension, dyslipidemia, moderate to severe mitral regurgitation and paroxysmal atrial fibrillation on Eliquis, who presented to the ER on 02/12/2022 for evaluation of altered mental status with confusion and forgetfulness per her family which had been worsening over the last couple of days.  She was seen at urgent care on 6/20 and diagnosed with UTI, reports taking all the antibiotics.  Daughter visiting from [place] reported on admission she may not have taken it.  Patient denies fever chills, dysuria or frequency, flank pain abdominal pain.  In the ED, patient was in A-fib with RVR initially heart rate as high as 191.  She was treated with IV Cardizem boluses with improvement of heart rate into the 80s.  Urinalysis with some signs of infection including moderate leukocytes and rare bacteria.  Patient was started on empiric IV Rocephin pending urine cultures.  Assessment and Plan: * Acute on chronic diastolic CHF (congestive heart failure) (HCC) Acute decompensation most likely due to A-fib with RVR previous echo showed normal EF of 60 to 65% in July 2021 with grade 1 diastolic dysfunction and moderate mitral regurgitation with mild aortic valve stenosis. Started on IV Lasix on admission -- Cardiology consulted --Continue IV Lasix -- Follow-up pending echo -- Continue Toprol-XL -- Monitor renal function electrolytes -- Potassium supplement held due to mildly elevated K -- Strict I/O's and daily weights  Atrial fibrillation with rapid ventricular response (HCC) Presented in A-fib RVR with heart rate up to 191, improved with IV Cardizem bolus and short acting oral Cardizem. --Cardiology consulted -- Started on Cardizem 30 mg every 6 hours -- Continue Toprol-XL 50  mg daily --Continue Eliquis 2.5 mg twice daily -- Monitor on telemetry -- Monitor and replace electrolytes  Uncontrolled hypertension BPs in the ED were 150/79, up to 157/114.  BPs have been rather labile but fairly well controlled today. -- Continue Cardizem and Toprol-XL. - As needed IV labetalol as needed  Elevated troponin I level Most likely demand ischemia.  Patient does not have chest pain or acute ischemic changes on EKG.  Dyslipidemia Continue Zetia  Hearing aid worn Patient's left hearing aid tip became detached and is lodged in patient's ear.  Visualized on otoscope.  Not causing any pain or issues. -- ENT consulted for assistance  Protein-calorie malnutrition, severe Related to chronic illness (A-fib, advanced age) as evidenced by percent weight loss, moderate to severe muscle depletion, moderate fat depletion. --Appreciate dietitian recommendations -- Liberalize diet -- Ensure supplement drinks and multivitamin started  Acute metabolic encephalopathy Present on admission likely due to UTI. Noncontrast head CT was negative. -- Manage UTI as outlined -- Delirium precautions -- Avoid sedating medications        Subjective: Patient up in recliner seen with daughter at bedside on rounds today.  Patient reports feeling fairly well today.  Daughter says that patient has been much more tired than usual recently.  Patient says that she took all of the antibiotics, daughter expresses uncertainty about that.  Daughter says her mental status has improved but is not at her baseline.  Patient had been having palpitations yesterday but not having this today.  No other acute complaints  Patient is left hearing aid tip apparently became detached and concern for being stuck in her ear canal.  Physical Exam: Vitals:   02/13/22 0724 02/13/22 0926 02/13/22 1132 02/13/22 1526  BP: (!) 111/58 (!) 136/105 128/90 127/78  Pulse: 92  96 80  Resp:   19 18  Temp: 97.6 F (36.4 C)   97.7 F (36.5 C) 97.9 F (36.6 C)  TempSrc: Oral  Oral Oral  SpO2: 96%  98% 98%  Weight:   39.8 kg    General exam: awake, alert, no acute distress, frail, in good spirits HEENT: Hard of hearing, moist mucus membranes, wearing glasses, clear conjunctiva, anicteric sclera Respiratory system: CTAB, no wheezes, rales or rhonchi, normal respiratory effort. Cardiovascular system: normal S1/S2, RRR, no JVD, murmurs, rubs, gallops, no pedal edema.   Gastrointestinal system: soft, NT, ND, no HSM felt, +bowel sounds. Central nervous system: A&O x person hypoglycemia. no gross focal neurologic deficits, normal speech Extremities: moves all, no edema, normal tone Skin: dry, intact, normal temperature Psychiatry: normal mood, congruent affect   Data Reviewed:  Notable labs: Calcium 8.5, anion gap 4, GFR 53.  CBC entirely normal  Echocardiogram pending  Family Communication: Daughter at bedside on rounds  Disposition: Status is: Inpatient Remains inpatient appropriate because: Ongoing evaluation and remains on empiric IV antibiotic pending cultures  Planned Discharge Destination: Home    Time spent: 45 minutes  Author: Pennie Banter, DO 02/13/2022 5:57 PM  For on call review [email or web address].

## 2022-02-13 NOTE — Progress Notes (Signed)
Initial Nutrition Assessment  DOCUMENTATION CODES:   Severe malnutrition in context of chronic illness, Underweight  INTERVENTION:  Liberalize diet from a heart healthy to a 2 gram sodium diet to provide widest variety of menu options to enhance nutritional adequacy Ensure Enlive po BID, each supplement provides 350 kcal and 20 grams of protein. MVI with minerals daily  NUTRITION DIAGNOSIS:   Severe Malnutrition related to chronic illness (afib, advanced age) as evidenced by percent weight loss, severe muscle depletion, moderate muscle depletion, moderate fat depletion.  GOAL:   Patient will meet greater than or equal to 90% of their needs  MONITOR:   PO intake, Supplement acceptance, Diet advancement, Labs, Weight trends, I & O's  REASON FOR ASSESSMENT:   Malnutrition Screening Tool    ASSESSMENT:   Pt admitted with dehydration and AMS, found to have acute on chronic CHF. Seen in urgent care on 06/20 for UTI.  PMH significant for HTN, dyslipidemia, moderate to severe mitral regurgitation and paroxysmal afib.   Pt pleasantly sitting in chair during visit. Pt reports living alone. Noted pt coming from [facility]. She reports that recently she has had a decrease in meal intake d/t a poor appetite and usually has convenience foods for easy of preparation. She is familiar with Ensure and reports drinking these on occasion and is agreeable to receive them during admission. She denies difficulty chewing and swallowing foods.   Pt endorses recent wt loss but states that her wt is usually around 100 lbs. Reviewed wt history. Her wt appears to have begun declining in December and has since had a 16.4% wt loss which is significant for time frame.   Pt would likely benefit from a liberalized diet given pt is malnourished and underweight with significant wt loss. She would also benefit from addition of nutrition supplements to enhance nutritional adequacy.   Medications: calcium carbonate,  lasix, MVI  Labs: anion gap 4, GFR 53  NUTRITION - FOCUSED PHYSICAL EXAM:  Flowsheet Row Most Recent Value  Orbital Region Moderate depletion  Upper Arm Region Severe depletion  Thoracic and Lumbar Region Moderate depletion  Buccal Region Mild depletion  Temple Region Moderate depletion  Clavicle Bone Region Moderate depletion  Clavicle and Acromion Bone Region Moderate depletion  Scapular Bone Region Moderate depletion  Dorsal Hand Severe depletion  Patellar Region Severe depletion  Anterior Thigh Region Severe depletion  Posterior Calf Region Moderate depletion  Edema (RD Assessment) None  Hair Reviewed  Eyes Reviewed  Mouth Reviewed  Skin Reviewed  Nails Reviewed      Diet Order:   Diet Order             Diet 2 gram sodium Room service appropriate? Yes; Fluid consistency: Thin  Diet effective now                   EDUCATION NEEDS:   Education needs have been addressed  Skin:  Skin Assessment: Reviewed RN Assessment  Last BM:  6/29 (type 4)  Height:   Ht Readings from Last 1 Encounters:  02/12/22 4\' 11"  (1.499 m)    Weight:   Wt Readings from Last 1 Encounters:  02/13/22 39.8 kg    Ideal Body Weight:  44.7 kg  BMI:  Body mass index is 17.71 kg/m.  Estimated Nutritional Needs:   Kcal:  1200-1400  Protein:  60-75g  Fluid:  >/=1.5L  02/15/22, RDN, LDN Clinical Nutrition

## 2022-02-13 NOTE — Progress Notes (Signed)
*  PRELIMINARY RESULTS* Echocardiogram 2D Echocardiogram has been performed.  Brandi [name] 02/13/2022, 2:58 PM

## 2022-02-13 NOTE — Assessment & Plan Note (Signed)
Related to chronic illness (A-fib, advanced age) as evidenced by percent weight loss, moderate to severe muscle depletion, moderate fat depletion. --Appreciate dietitian recommendations -- Liberalize diet -- Ensure supplement drinks and multivitamin started

## 2022-02-13 NOTE — Consult Note (Signed)
Cardiology Consultation:   Patient ID: Brandi [name]; [id number]; [birth year]   Admit date: 02/12/2022 Date of Consult: 02/13/2022  Primary [name] Provider: [name], MD Primary Cardiologist: Brandi [name] Primary Electrophysiologist:  None   Patient Profile:   Brandi [name] is a 88 y.o. female with a hx of PAF on renally dosed Eliquis, HFpEF, aortic stenosis, MVP with moderate to severe mitral regurgitation, tricuspid regurgitation, HTN, and HLD who is being seen today for the evaluation of Afib with RVR and HFpEF at the request of Brandi [name].  History of Present Illness:   Brandi [name] was previously followed by cardiology for valvular heart disease. She was diagnosed with Afib/flutter in 08/2019 with event monitor at that time showing 100% Afib burden. In follow up in 10/2019, she was noted to have been in sinus rhythm. She has been maintained on OAC. 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   She was seen at an urgent [name] on 6/20 with intermittent confusion for 2 weeks and diagnosed with a UTI. Heart rate documented at 81 bpm at that visit with notation of A/O x3. It appears she was not taking antibiotic thereafter in the setting of encephalopathy. A family memeber came into town for the July 4th holiday and found the patient more confused than baseline. In this setting, she was brought to the ED where she was noted to be volume up and in Afib with RVR with ventricular rates into the 140s to 150s bpm. BP stable. Head CT without intracranial abnormality with noted right sphenoid sinusitis. CXR with cardiomegaly with suggestion of mild interstitial edema or PNA along with small bilateral pleural effusions. High  sensitivity troponin peaked at 99 and is down trending. BNP 1170. WBC 11.1. Potassium 5.4 trending to 3.8, BUN/SCr 28/1.32 trending to 21/1.00. Albumin 3.1. In the ED, she received IV Cardizem 10 mg followed by 15 mg and a 60 mg of oral diltiazem last evening. She received 1 L of IV fluids. She was given 40 mg of IV Lasix and was placed on IV Lasix 40 mg bid upon admission.   Currently, she is without symptoms of angina, dyspnea at rest, or palpitations. She reports missing a dose of Eliquis on 02/12/2022. She also reports one fall, after running into a partially open door where she fell backwards onto her buttock and back. She remains in Afib with ventricular rates in the 80s bpm at rest overnight, and into the 120s to 140s bpm this morning while up and sitting in a recliner.     Past Medical History:  Diagnosis Date   Essential hypertension    Hyperlipidemia    Mitral valve prolapse    Moderate to Severe Mitral regurgitation    a. 07/2018 Echo: EF 65-70%. Mild AS. Mild MVP w/ mild MS and mod-sev MR. Sev dil LA. Midly dil RA. PASP 40-20mmHg.   PAF (paroxysmal atrial fibrillation) (HCC)    a. CHA2DS2VASc = 4-->Eliquis 2.5mg  BID; b. 08/2019 Event Monitor: 100% Afib (52-199, avg 96). 4 runs of VT vs afib w/ aberrancy.   PAH (pulmonary artery hypertension) (HCC)    a. 07/2018 Echo: PASP 40-40mmHg in setting of mild MS and mod-sev MR.    Past Surgical History:  Procedure Laterality Date  BIOPSY BREAST  1980   left ,  benign   BREAST BIOPSY  1980   normal   CATARACT EXTRACTION W/PHACO Left 05/01/2020   Procedure: CATARACT EXTRACTION PHACO AND INTRAOCULAR LENS PLACEMENT (IOC) LEFT 6.81 00:48.8;  Surgeon: Galen Manila, MD;  Location: [facility];  Service: Ophthalmology;  Laterality: Left;   CATARACT EXTRACTION W/PHACO Right 05/22/2020   Procedure: CATARACT EXTRACTION PHACO AND INTRAOCULAR LENS PLACEMENT (IOC) RIGHT 7.41 00:43.3;  Surgeon: Galen Manila, MD;  Location: [facility];  Service: Ophthalmology;  Laterality: Right;   Hospitalized for endocarditis  1987     Home Meds: Prior to Admission medications   Medication Sig Start Date End Date Taking? Authorizing Provider  apixaban (ELIQUIS) 2.5 MG TABS tablet TAKE 1 TABLET BY MOUTH TWICE  DAILY 12/24/21   Antonieta Iba, MD  CALCIUM 600 1500 (600 Ca) MG TABS tablet Take by mouth. 04/13/20   [provider]  Calcium Carbonate-Vit D-Min (CALCIUM 1200 PO) Take 600 mg by mouth.    [provider]  ezetimibe (ZETIA) 10 MG tablet TAKE 1 TABLET BY MOUTH DAILY 12/24/21   Antonieta Iba, MD  furosemide (LASIX) 20 MG tablet TAKE 1 TABLET BY MOUTH DAILY AS  NEEDED FOR FLUID RETENTION 12/24/21   Antonieta Iba, MD  metoprolol succinate (TOPROL-XL) 50 MG 24 hr tablet TAKE 1 TABLET BY MOUTH  DAILY WITH OR IMMEDIATELY  FOLLOWING A MEAL 09/04/21   Antonieta Iba, MD  Multiple Vitamin (MULTIVITAMIN) tablet Take 1 tablet by mouth daily.    [provider]  potassium chloride (KLOR-CON M) 10 MEQ tablet TAKE 1 TABLET BY MOUTH  DAILY 12/24/21   Antonieta Iba, MD    Inpatient Medications: Scheduled Meds:  apixaban  2.5 mg Oral BID   calcium carbonate  1,250 mg Oral Q breakfast   diltiazem  30 mg Oral Q6H   ezetimibe  10 mg Oral Daily   furosemide  40 mg Intravenous Q12H   metoprolol succinate  50 mg Oral Daily   multivitamin with minerals  1 tablet Oral Daily   Continuous Infusions:  PRN Meds: acetaminophen **OR** acetaminophen, ondansetron **OR** ondansetron (ZOFRAN) IV, senna-docusate, traZODone  Allergies:   Allergies  Allergen Reactions   Codeine     REACTION: N \\T \ V   Statins     REACTION: INTOLERANCE: muscle aches   Sulfonamide Derivatives     REACTION: N \\T \ V    Social History:   Social History   Socioeconomic History   Marital status: Widowed    Spouse name: Not on file   Number of children: 0   Years of education: Not on file   Highest education level: Not  on file  Occupational History   Occupation: retired  Tobacco Use   Smoking status: Never   Smokeless tobacco: Never  Vaping Use   Vaping Use: Never used  Substance and Sexual Activity   Alcohol use: No   Drug use: No   Sexual activity: Not Currently  Other Topics Concern   Not on file  Social History Narrative   Lives at [facility]   Exercise, walks 7 days a week with aerobic exercise 2 days a week      Social Determinants of Health   Financial Resource Strain: Low Risk  (09/18/2021)   Overall Financial Resource Strain (CARDIA)    Difficulty of Paying Living Expenses: Not hard at all  Food Insecurity: No Food Insecurity (09/18/2021)   Hunger Vital  Sign    Worried About Programme researcher, broadcasting/film/videounning Out of Food in the Last Year: Never true    Ran Out of Food in the Last Year: Never true  Transportation Needs: No Transportation Needs (09/18/2021)   PRAPARE - Administrator, Civil ServiceTransportation    Lack of Transportation (Medical): No    Lack of Transportation (Non-Medical): No  Physical Activity: Sufficiently Active (09/18/2021)   Exercise Vital Sign    Days of Exercise per Week: 3 days    Minutes of Exercise per Session: 60 min  Stress: No Stress Concern Present (09/18/2021)   [employer]ational [employer] - Occupational Stress Questionnaire    Feeling of Stress : Not at all  Social Connections: Unknown (09/18/2021)   Social Connection and Isolation Panel [NHANES]    Frequency of Communication with Friends and Family: More than three times a week    Frequency of Social Gatherings with Friends and Family: Once a week    Attends Religious Services: Not on Insurance claims handlerfile    Active Member of Clubs or Organizations: Yes    Attends BankerClub or Organization Meetings: Not on file    Marital Status: Widowed  Intimate Partner Violence: Not At Risk (09/18/2021)   Humiliation, Afraid, Rape, and Kick questionnaire    Fear of Current or Ex-Partner: No    Emotionally Abused: No    Physically Abused: No    Sexually Abused: No     Family  History:   Family History  Problem Relation Age of Onset   Heart attack Father    Coronary artery disease Father    Cancer Sister        esophageal   Heart disease Brother     ROS:  Review of Systems  Constitutional:  Positive for malaise/fatigue. Negative for chills, diaphoresis, fever and weight loss.  HENT:  Negative for congestion.   Eyes:  Negative for discharge and redness.  Respiratory:  Negative for cough, sputum production, shortness of breath and wheezing.   Cardiovascular:  Negative for chest pain, palpitations, orthopnea, claudication, leg swelling and PND.  Gastrointestinal:  Negative for abdominal pain, blood in stool, heartburn, melena, nausea and vomiting.  Musculoskeletal:  Positive for falls. Negative for myalgias.  Skin:  Negative for rash.  Neurological:  Negative for dizziness, tingling, tremors, sensory change, speech change, focal weakness, loss of consciousness and weakness.  Endo/Heme/Allergies:  Does not bruise/bleed easily.  Psychiatric/Behavioral:  Negative for substance abuse. The patient is not nervous/anxious.   All other systems reviewed and are negative.     Physical Exam/Data:   Vitals:   02/12/22 2354 02/13/22 0424 02/13/22 0724 02/13/22 0926  BP: 140/86 (!) 114/56 (!) 111/58 (!) 136/105  Pulse: 87 88 92   Resp: 18 18    Temp: 97.7 F (36.5 C) (!) 97.5 F (36.4 C) 97.6 F (36.4 C)   TempSrc:   Oral   SpO2:  97% 96%     Intake/Output Summary (Last 24 hours) at 02/13/2022 [id number] Last data filed at 02/13/2022 [id number] Gross per 24 hour  Intake --  Output 500 ml  Net -500 ml   There were no vitals filed for this visit. There is no height or weight on file to calculate BMI.   Physical Exam: General: Well developed, well nourished, in no acute distress. Head: Normocephalic, atraumatic, sclera non-icteric, no xanthomas, nares without discharge.  Neck: Negative for carotid bruits. JVD not elevated. Lungs: Diminished breath sounds along the  bases bilaterally. Breathing is unlabored. Heart: Tachycardic, IRIR with S1 S2. II/VI  systolic murmur RUSB, II/VI systolic murmur LLSB, no rubs, or gallops appreciated. Abdomen: Soft, non-tender, non-distended with normoactive bowel sounds. No hepatomegaly. No rebound/guarding. No obvious abdominal masses. Msk:  Strength and tone appear normal for age. Extremities: No clubbing or cyanosis. No edema. Distal pedal pulses are 2+ and equal bilaterally. Neuro: Alert and oriented X 3. No facial asymmetry. No focal deficit. Moves all extremities spontaneously. Psych:  Responds to questions appropriately with a normal affect.   EKG:  The EKG was personally reviewed and demonstrates: Afib with RVR, 151 bpm, right axis deviation, LVH, nonspecific st/t changes Telemetry:  Telemetry was personally reviewed and demonstrates: Afib with ventricular rates in the 140s bpm, trending to the 80s bpm overnight with rare aberrancy   Weights: There were no vitals filed for this visit.  Relevant CV Studies:  2D echo pending __________   2D echo 02/17/2020: 1. Thicken of anterioe MV leafet Vegation can not be excluded. Probable  just calcium build up.   2. Left ventricular ejection fraction, by estimation, is 60 to 65%. The  left ventricle has normal function. The left ventricle has no regional  wall motion abnormalities. There is mild left ventricular hypertrophy.  Left ventricular diastolic parameters  are consistent with Grade I diastolic dysfunction (impaired relaxation).   3. Right ventricular systolic function is normal. The right ventricular  size is normal.   4. Left atrial size was moderately dilated.   5. The mitral valve is myxomatous. Prolapse of the anterior leaflet  (posterior leaflet not well visualized) Moderate mitral valve  regurgitation. No evidence of mitral stenosis.   6. The aortic valve is grossly normal. Aortic valve regurgitation is not  visualized. Mild aortic valve  stenosis. __________   Luci Bank patch 08/2019: Rhythm is atrial fibrillation, 100% burden ranging from 52-199 bpm (avg of 96 bpm).   4 Ventricular Tachycardia (unable to exclude atrial fibrillation with aberrancy) runs occurred, the run with the fastest interval lasting 9 beats with a max rate of 193 bpm, the longest lasting 10 beats with an avg rate of 163 bpm.  No patient triggered   Isolated VEs were occasional (1.8%, 32223), VE Couplets were rare (<1.0%, 58), and VE Triplets were rare (<1.0%, 2). Ventricular Bigeminy and Trigeminy were present.   Patient triggered events were not associated with significant arrhythmia __________   2D echo 07/20/2018: - Left ventricle: The cavity size was normal. Wall thickness was    increased in a pattern of mild LVH. Systolic function was    vigorous. The estimated ejection fraction was in the range of 65%    to 70%. Mitral stenosis precludes accurate assessment of    diastolic function.  - Aortic valve: Trileaflet; mildly thickened leaflets. Cusp    separation was mildly reduced. Transvalvular velocity was    increased. There was mild stenosis.  - Mitral valve: Calcified annulus. Severely thickened leaflets .    Leaflet separation was mildly reduced. Mild prolapse. The    findings are consistent with mild stenosis. There was moderate to    severe regurgitation directed posteriorly.  - Left atrium: The atrium was severely dilated.  - Right atrium: The atrium was mildly dilated.  - Pulmonary arteries: Systolic pressure was moderately increased,    in the range of 40 mm Hg to 45 mm Hg plus central venous/right    atrial pressure.  - Pericardium, extracardiac: A trivial pericardial effusion was    identified. ___________   2D echo 10/23/2016: - Procedure narrative: Transthoracic echocardiography. Image  quality was fair. The study was technically difficult, as a    result of poor acoustic windows.  - Left ventricle: The cavity size was normal.  Wall thickness was    increased in a pattern of mild LVH. Systolic function was normal.    The estimated ejection fraction was in the range of 60% to 65%.    Wall motion was normal; there were no regional wall motion    abnormalities. Features are consistent with a pseudonormal left    ventricular filling pattern, with concomitant abnormal relaxation    and increased filling pressure (grade 2 diastolic dysfunction).  - Aortic valve: There appears to be mild to moderate stenosis:    based on gradient and dimensionless index. Valve area (VTI): 0.99    cm^2. Valve area (Vmean): 0.9 cm^2.  - Mitral valve: Moderate myxomatous degeneration. Prolapse noted of    anterior mitral valve leaflet. There was moderate to severe    regurgitation.  - Left atrium: The atrium was severely dilated.  - Right atrium: The atrium was moderately dilated.  - Tricuspid valve: There was moderate-severe regurgitation.  - Pulmonary arteries: Systolic pressure was moderately increased.    PA peak pressure: 54 mm Hg (S). __________   2D echo 07/08/2011: - Left ventricle: The cavity size was normal. Wall thickness    was normal. Systolic function was normal. The estimated    ejection fraction was in the range of 55% to 60%. Wall    motion was normal; there were no regional wall motion    abnormalities. Doppler parameters are consistent with    abnormal left ventricular relaxation (grade 1 diastolic    dysfunction).  - Aortic valve: Transvalvular velocity was mildly elevated    consistent with borderline to mild AV stenosis. Trivial    regurgitation.  - Mitral valve: Mildly thickened leaflets . Moderate    myxomatous degeneration. Prolapse, most notable of the    anterior leaflet. Mild to Moderate regurgitation.  - Tricuspid valve: Mild regurgitation.  - Pulmonary arteries: Systolic pressure was within the    normal range.  Laboratory Data:  Chemistry Recent Labs  Lab 02/12/22 1611 02/13/22 0600  NA 135  138  K 5.4* 3.8  CL 101 106  CO2 26 28  GLUCOSE 131* 95  BUN 28* 21  CREATININE 1.32* 1.00  CALCIUM 9.3 8.5*  GFRNONAA 38* 53*  ANIONGAP 8 4*    Recent Labs  Lab 02/12/22 1611  PROT 6.5  ALBUMIN 3.1*  AST 59*  ALT 22  ALKPHOS 73  BILITOT 0.9   Hematology Recent Labs  Lab 02/12/22 1611 02/13/22 0600  WBC 11.1* 10.1  RBC 4.38 4.22  HGB 13.7 13.4  HCT 43.6 40.8  MCV 99.5 96.7  MCH 31.3 31.8  MCHC 31.4 32.8  RDW 14.6 14.6  PLT 390 324   Cardiac EnzymesNo results for input(s): "TROPONINI" in the last 168 hours. No results for input(s): "TROPIPOC" in the last 168 hours.  BNP Recent Labs  Lab 02/12/22 1611  BNP 1,170.7*    DDimer No results for input(s): "DDIMER" in the last 168 hours.  Radiology/Studies:  CT HEAD WO CONTRAST ( )  Result Date: 02/12/2022 IMPRESSION: 1. No acute intracranial abnormality. 2. Right sphenoid sinusitis. Electronically Signed   By: Allegra Lai M.D.   On: 02/12/2022 16:37   DG Chest Port 1 View  Result Date: 02/12/2022 IMPRESSION: Cardiomegaly. Central pulmonary vessels are prominent. There is prominence of interstitial markings in the parahilar regions and lower  lung fields suggesting mild interstitial edema or interstitial pneumonia. Small bilateral pleural effusions. Electronically Signed   By: Ernie Avena M.D.   On: 02/12/2022 16:27    Assessment and Plan:   1. Afib with RVR: -She remains in Afib with RVR -Add diltiazem 30 mg q 6 hours with recommendation to consolidate as able -She has already received Toprol XL 50 mg this morning, may need to transition to Lopressor for added rate control -She reports missing a dose of Eliquis on 02/12/2022, she also did not receive her Eliquis on the evening of 6/28 -Will plan for rate control at this time with recommendation for outpatient DCCV once she has been adequately anticoagulated, pending adequate rate control during admission (has previously spontaneously converted) -If  her ventricular rates are difficult to control, she would need a TEE/DCCV prior to discharge -CHADS2VASc at least 5 (CHF, HTN, age x 2, sex category) -Eliquis 2.5 mg bid (age and weight)  2. HFpEF: -Likely exacerbated by Afib with RVR -IV Lasix -Close monitoring of renal function -Echo  3. Valvular heart disease: -Most recent echo from 02/2020 demonstrated prolapse of the anterior mitral valve leaflet with moderate regurgitation without stenosis, mild tricuspid regurgitation, and mild aortic stenosis -Update echo  4. Elevated high sensitivity troponin: -Mildly elevated and flat trending, not consistent with ACS -Echo pending as above -No symptoms concerning for ischemia -Likely supply demand ischemia in the setting of Afib with RVR, HFpEF, and AKI -No plans for inpatient ischemic evaluation at this time  5. AKI: -Improved -Monitor with diuresis  6. Hyperkalemia: -Likely in the setting of hemolysis, resolved on recheck       For questions or updates, please contact [facility] Please consult [email or web address] for contact info under Cardiology/STEMI.   Signed, Eula Listen, PA-C [facility] Pager: [phone] 02/13/2022, 9:38 AM

## 2022-02-14 DIAGNOSIS — I5033 Acute on chronic diastolic (congestive) heart failure: Secondary | ICD-10-CM | POA: Diagnosis not present

## 2022-02-14 DIAGNOSIS — B952 Enterococcus as the cause of diseases classified elsewhere: Secondary | ICD-10-CM | POA: Diagnosis not present

## 2022-02-14 DIAGNOSIS — I4891 Unspecified atrial fibrillation: Secondary | ICD-10-CM | POA: Diagnosis not present

## 2022-02-14 DIAGNOSIS — I272 Pulmonary hypertension, unspecified: Secondary | ICD-10-CM

## 2022-02-14 DIAGNOSIS — N390 Urinary tract infection, site not specified: Secondary | ICD-10-CM

## 2022-02-14 LAB — URINE CULTURE
Culture: 60000 — AB
Culture: NO GROWTH

## 2022-02-14 LAB — BASIC METABOLIC PANEL
Anion gap: 7 (ref 5–15)
BUN: 32 mg/dL — ABNORMAL HIGH (ref 8–23)
CO2: 32 mmol/L (ref 22–32)
Calcium: 9.2 mg/dL (ref 8.9–10.3)
Chloride: 98 mmol/L (ref 98–111)
Creatinine, Ser: 1.12 mg/dL — ABNORMAL HIGH (ref 0.44–1.00)
GFR, Estimated: 46 mL/min — ABNORMAL LOW (ref >60)
Glucose, Bld: 109 mg/dL — ABNORMAL HIGH (ref 70–99)
Potassium: 3.5 mmol/L (ref 3.5–5.1)
Sodium: 137 mmol/L (ref 135–145)

## 2022-02-14 LAB — MAGNESIUM: Magnesium: 1.9 mg/dL (ref 1.7–2.4)

## 2022-02-14 MED ORDER — FUROSEMIDE 40 MG PO TABS
40.0000 mg | ORAL_TABLET | Freq: Every day | ORAL | Status: DC
  Administered 2022-02-14 – 2022-02-15 (×2): 40 mg via ORAL
  Filled 2022-02-14 (×2): qty 1

## 2022-02-14 MED ORDER — NITROFURANTOIN MONOHYD MACRO 100 MG PO CAPS: 100.0000 mg | ORAL_CAPSULE | Freq: Two times a day (BID) | ORAL | Status: DC

## 2022-02-14 MED ORDER — AMOXICILLIN 250 MG PO CAPS
250.0000 mg | ORAL_CAPSULE | Freq: Two times a day (BID) | ORAL | Status: DC
Start: 2022-02-14 — End: 2022-02-15
  Administered 2022-02-14 – 2022-02-15 (×3): 250 mg via ORAL
  Filled 2022-02-14 (×3): qty 1

## 2022-02-14 MED ORDER — METOPROLOL SUCCINATE ER 50 MG PO TB24
50.0000 mg | ORAL_TABLET | Freq: Two times a day (BID) | ORAL | Status: DC
  Administered 2022-02-14 (×2): 50 mg via ORAL
  Filled 2022-02-14 (×2): qty 1

## 2022-02-14 MED ORDER — POTASSIUM CHLORIDE 20 MEQ PO PACK
40.0000 meq | PACK | Freq: Once | ORAL | Status: AC
Start: 2022-02-14 — End: 2022-02-14
  Administered 2022-02-14: 40 meq via ORAL
  Filled 2022-02-14: qty 2

## 2022-02-14 NOTE — Consult Note (Addendum)
..Brandi [name], Brandi [name] [id number] 1927-03-16 Pennie Banter, DO  Reason for Consult: Foreign body left ear  HPI: 88 y.o. female admitted to hospital for altered mental status and confusion, UTI, A-fib and confusion.  Last night, per patient and daughter after taking her hearing aid out, it was noted to be lacking the bell.  Patient's daughter reports she saw a dark object in the patient's ear.  Patient has a baseline of hearing loss using hearing aids through [facility].  She denies any pain, dizziness, or worsening hearing loss.  Allergies:  Allergies  Allergen Reactions   Codeine     REACTION: N \\T \ V   Statins     REACTION: INTOLERANCE: muscle aches   Sulfonamide Derivatives     REACTION: N \\T \ V    ROS: Review of systems normal other than 12 systems except per HPI.  PMH:  Past Medical History:  Diagnosis Date   Essential hypertension    Hyperlipidemia    Mitral valve prolapse    Moderate to Severe Mitral regurgitation    a. 07/2018 Echo: EF 65-70%. Mild AS. Mild MVP w/ mild MS and mod-sev MR. Sev dil LA. Midly dil RA. PASP 40-63mmHg.   PAF (paroxysmal atrial fibrillation) (HCC)    a. CHA2DS2VASc = 4-->Eliquis 2.5mg  BID; b. 08/2019 Event Monitor: 100% Afib (52-199, avg 96). 4 runs of VT vs afib w/ aberrancy.   PAH (pulmonary artery hypertension) (HCC)    a. 07/2018 Echo: PASP 40-26mmHg in setting of mild MS and mod-sev MR.    FH:  Family History  Problem Relation Age of Onset   Heart attack Father    Coronary artery disease Father    Cancer Sister        esophageal   Heart disease Brother     SH:  Social History   Socioeconomic History   Marital status: Widowed    Spouse name: Not on file   Number of children: 0   Years of education: Not on file   Highest education level: Not on file  Occupational History   Occupation: retired  Tobacco Use   Smoking status: Never   Smokeless tobacco: Never  Vaping Use   Vaping Use: Never used  Substance and Sexual Activity    Alcohol use: No   Drug use: No   Sexual activity: Not Currently  Other Topics Concern   Not on file  Social History Narrative   Lives at [facility]   Exercise, walks 7 days a week with aerobic exercise 2 days a week      Social Determinants of Health   Financial Resource Strain: Low Risk  (09/18/2021)   Overall Financial Resource Strain (CARDIA)    Difficulty of Paying Living Expenses: Not hard at all  Food Insecurity: No Food Insecurity (09/18/2021)   Hunger Vital Sign    Worried About Running Out of Food in the Last Year: Never true    Ran Out of Food in the Last Year: Never true  Transportation Needs: No Transportation Needs (09/18/2021)   PRAPARE - Administrator, Civil Service (Medical): No    Lack of Transportation (Non-Medical): No  Physical Activity: Sufficiently Active (09/18/2021)   Exercise Vital Sign    Days of Exercise per Week: 3 days    Minutes of Exercise per Session: 60 min  Stress: No Stress Concern Present (09/18/2021)   [employer] of Occupational Health - Occupational Stress Questionnaire    Feeling of Stress : Not at all  Social Connections: Unknown (09/18/2021)   Social Connection and Isolation Panel [NHANES]    Frequency of Communication with Friends and Family: More than three times a week    Frequency of Social Gatherings with Friends and Family: Once a week    Attends Religious Services: Not on Marketing executive or Organizations: Yes    Attends Banker Meetings: Not on file    Marital Status: Widowed  Intimate Partner Violence: Not At Risk (09/18/2021)   Humiliation, Afraid, Rape, and Kick questionnaire    Fear of Current or Ex-Partner: No    Emotionally Abused: No    Physically Abused: No    Sexually Abused: No    PSH:  Past Surgical History:  Procedure Laterality Date   BIOPSY BREAST  1980   left ,  benign   BREAST BIOPSY  1980   normal   CATARACT EXTRACTION W/PHACO Left 05/01/2020   Procedure: CATARACT  EXTRACTION PHACO AND INTRAOCULAR LENS PLACEMENT (IOC) LEFT 6.81 00:48.8;  Surgeon: Galen Manila, MD;  Location: [facility];  Service: Ophthalmology;  Laterality: Left;   CATARACT EXTRACTION W/PHACO Right 05/22/2020   Procedure: CATARACT EXTRACTION PHACO AND INTRAOCULAR LENS PLACEMENT (IOC) RIGHT 7.41 00:43.3;  Surgeon: Galen Manila, MD;  Location: [facility];  Service: Ophthalmology;  Laterality: Right;   Hospitalized for endocarditis  1987    Physical  Exam:  GEN- NAD, sitting upright eating dinner NOSE- clear anteriorly OC/OP- no masses or lesions EARS-  mild dry skin and cerumen in ear canal, TMs intact without foreign body or abnormal lesion.  Canal widely patent.  Middle ear space clear. Neck- no LAD CARD- tachycardia RESP- unlabored.  A/P: No evidence of foreign body noted, possibly dislodged by patient overnight.  Will contact our office to coordinate fixing of bell to see if can get hearing aids working for use while in hospital.   Bud Face 02/14/2022 8:08 AM   2

## 2022-02-14 NOTE — Progress Notes (Signed)
Progress Note   Patient: Brandi [name] DOB: May 18, 1927 DOA: 02/12/2022     2 DOS: the patient was seen and examined on 02/14/2022   Brief hospital course: 88 y.o. female with medical history significant for essential hypertension, dyslipidemia, moderate to severe mitral regurgitation and paroxysmal atrial fibrillation on Eliquis, who presented to the ER on 02/12/2022 for evaluation of altered mental status with confusion and forgetfulness per her family which had been worsening over the last couple of days.  She was seen at urgent care on 6/20 and diagnosed with UTI, reports taking all the antibiotics.  Daughter visiting from [place] reported on admission she may not have taken it.  Patient denies fever chills, dysuria or frequency, flank pain abdominal pain.  In the ED, patient was in A-fib with RVR initially heart rate as high as 191.  She was treated with IV Cardizem boluses with improvement of heart rate into the 80s.  Urinalysis with some signs of infection including moderate leukocytes and rare bacteria.  Patient was started on empiric IV Rocephin pending urine cultures.  Assessment and Plan: * Acute on chronic diastolic CHF (congestive heart failure) (HCC) Acute decompensation most likely due to A-fib with RVR previous echo showed normal EF of 60 to 65%, grade 2 diastolic dysfunction, severely elevated PASP, mild to moderate MR, mild AS. Started on IV Lasix on admission -- Cardiology consulted -- Treated with IV Lasix --Transition to oral Lasix 40 mg daily -- Continue Toprol-XL, increased to twice daily dosing per cardiology -- Monitor renal function and electrolytes -- Strict I/O's and daily weights  Atrial fibrillation with rapid ventricular response (HCC) Presented in A-fib RVR with heart rate up to 191, improved with IV Cardizem bolus and short acting oral Cardizem.  6/30: Heart rates have improved but elevated to 133 with ambulation, improved to 110s at  rest --Cardiology consulted -- Started on Cardizem 30 mg every 6 hours, since discontinued -- Continue Toprol-XL 50 mg increased to twice daily --Continue Eliquis 2.5 mg twice daily -- Monitor on telemetry -- Monitor and replace electrolytes  Uncontrolled hypertension BPs in the ED were 150/79, up to 157/114.  BPs have been rather labile but fairly well controlled today. -- Continue Cardizem and Toprol-XL. - As needed IV labetalol as needed  Elevated troponin I level Most likely demand ischemia.  Patient does not have chest pain or acute ischemic changes on EKG.  Dyslipidemia Continue Zetia  Hearing aid worn Patient's left hearing aid tip became detached and is lodged in patient's ear.  Visualized on otoscope.  Not causing any pain or issues. -- ENT consulted for assistance. On their exam, no foreign body was seen -- Outpatient follow-up  Protein-calorie malnutrition, severe Related to chronic illness (A-fib, advanced age) as evidenced by percent weight loss, moderate to severe muscle depletion, moderate fat depletion. --Appreciate dietitian recommendations -- Liberalize diet -- Ensure supplement drinks and multivitamin started  Enterococcus UTI Recently treated as outpatient for UTI with empiric Keflex.  Presented with altered mental status but without specific GU complaints.  UA concerning for infection and culture grew Enterococcus faecalis sensitive to ampicillin, Macrobid, vancomycin. -- Amoxicillin x3 days  Acute metabolic encephalopathy Present on admission likely due to UTI. Noncontrast head CT was negative. -- Manage UTI as outlined -- Delirium precautions -- Avoid sedating medications        Subjective: Patient up in recliner seen with daughter at bedside on rounds today.  Patient reports feeling well and denies acute complaints including fevers  chills, chest pain, palpitations, shortness of breath, nausea vomiting, dysuria or urinary frequency.  Daughter  states mental status is not yet at baseline and hopes to see this further improved before discharge.  No other acute complaints.  Physical Exam: Vitals:   02/14/22 0600 02/14/22 0803 02/14/22 1144 02/14/22 1616  BP:  123/75 118/66 (!) 141/93  Pulse:  88 64 73  Resp:  18 17 18   Temp:  98.1 F (36.7 C) 97.9 F (36.6 C) 98 F (36.7 C)  TempSrc:  Oral Oral Oral  SpO2:  96% 99% 96%  Weight: 38.5 kg     Height:       General exam: awake, alert, no acute distress, frail, in good spirits HEENT: Wearing glasses, moist mucous membranes, hard of hearing Respiratory system: Lungs clear bilaterally without wheezes or rhonchi, normal respiratory effort on room air. Cardiovascular system: Regular rate and rhythm, no peripheral edema, systolic murmur noted.   Central nervous system: Alert and oriented, grossly nonfocal exam, normal speech Extremities: moves all, no edema, normal tone Psychiatry: normal mood, congruent affect   Data Reviewed:  Notable labs: Glucose 109, BUN 32, creatinine 1.12, GFR 46  Echocardiogram --- EF 60 to 65%, grade 2 diastolic dysfunction, severely elevated PASP, left atrium severely dilated, right atrium moderately dilated, mild to moderate MR, mild AS  Family Communication: Daughter at bedside on rounds  Disposition: Status is: Inpatient Remains inpatient appropriate because: cardiology continues to improve heart rate control.  Anticipate d/c in 24-48 hrs.   Planned Discharge Destination: Home with home health     Time spent: 35 minutes  Author: 07-06-1980, DO 02/14/2022 5:31 PM  For on call review [email or web address].

## 2022-02-14 NOTE — Progress Notes (Signed)
   Heart Failure Nurse Navigator Note  Met with patient and her stepdaughter, being who was at the bedside.  Patient remembered that I had spoken with her previously.  With the patient's stepdaughter went over the importance of daily weights, what to report, eating a low-sodium diet, removing salt Shaker from the table.  She states that her stepmom had been eating a lot of Viens dinners explained to her that if they would read the labels and get once with the lower sodium content of at least 600 mg or less that that would be better for her.  She voices concern over the amount of fluid that her mother takes in daily.  Explained that she should just drink when she is thirsty.  As long as she does not have symptoms of dizziness lightheadedness or low blood pressure she is probably taking in enough fluids for her.  Explained that it defeats the purpose of being on a water pill and drinking more than 64 ounces.  She voices understanding.  She states that she is going to be here in [place] until July 7 and then she will be going back to [place] but that she could come back at any time.  She relates that they are working on getting some home health to help with meal preparation etc.  She had no further questions.  I told her to feel free to contact me at any time as I will be glad to help her with any questions or concerns.  Pricilla Riffle RN [facility]

## 2022-02-14 NOTE — Care Management Important Message (Signed)
Important Message  Patient Details  Name: Brandi [name] MRN: [id number] Date of Birth: 1927-02-19   Medicare Important Message Given:  N/A - LOS <3 / Initial given by admissions     Johnell Comings 02/14/2022, 8:56 AM

## 2022-02-14 NOTE — Evaluation (Addendum)
Physical Therapy Evaluation Patient Details Name: Brandi [name] MRN: [id number] DOB: 05-09-1927 Today's Date: 02/14/2022  History of Present Illness  Pt is a 88 yo female that presented to the ED for AMS. Workup showed afib with RVR and potential acute CHF. PMH of afib, aortic stenosis, HTN, HLD. Recently diagnosed with UTI, HOH.   Clinical Impression  Patient alert, agreeable to PT oriented to self and place, disoriented to situation, reported time as July and 1923. Per pt report she lives alone, 1 fall recently (family provided information about it being back in April) does not use DME/AD for mobility. Pt endorsed that she still drives and grocery shops, but is unable to recall what she recently bought/when the last time she went was.   The patient demonstrated BUE and BLE strength WFLs. Bed mobility and transfers modI (use of hands). She ambulated ~131ft no true LOB, but was noted to have some gait path deviations and decreased gait velocity. The patient did voice she had tried some PT in the past. Pt educated on benefit of HHPT to maximize safety and balance to decrease risk of falls. Pt/PT/family also discussed benefit of PCA to assist with cooking/meal prep/medication management at the end of session as well, CSW updated of PT recommendations.       HR ranged from 118-133 with mobility, pt does return to 110s when returning to sitting in the room.    Recommendations for follow up therapy are one component of a multi-disciplinary discharge planning process, led by the attending physician.  Recommendations may be updated based on patient status, additional functional criteria and insurance authorization.  Follow Up Recommendations Home health PT      Assistance Recommended at Discharge Intermittent Supervision/Assistance  Patient can return home with the following  Assistance with cooking/housework;Direct supervision/assist for financial management;Assist for transportation;Direct  supervision/assist for medications management    Equipment Recommendations None recommended by PT  Recommendations for Other Services       Functional Status Assessment Patient has had a recent decline in their functional status and demonstrates the ability to make significant improvements in function in a reasonable and predictable amount of time.     Precautions / Restrictions Precautions Precautions: Fall Restrictions Weight Bearing Restrictions: No      Mobility  Bed Mobility Overal bed mobility: Modified Independent                  Transfers Overall transfer level: Modified independent Equipment used: None                    Ambulation/Gait   Gait Distance (Feet): 170 Feet Assistive device: Rolling walker (2 wheels)         General Gait Details: some gait deviations noted, no true LOB pt able to self correct  Stairs            Wheelchair Mobility    Modified Rankin (Stroke Patients Only)       Balance Overall balance assessment: Needs assistance Sitting-balance support: Feet supported Sitting balance-Leahy Scale: Normal       Standing balance-Leahy Scale: Good                               Pertinent Vitals/Pain Pain Assessment Pain Assessment: No/denies pain    Home Living Family/patient expects to be discharged to:: Private residence Living Arrangements: Alone Available Help at Discharge: Family;Neighbor;Available PRN/intermittently Type of Home: Independent living facility  Home Access: Stairs to enter Entrance Stairs-Rails: Right Entrance Stairs-Number of Steps: 1   Home Layout: One level Home Equipment: Rollator (4 wheels) Additional Comments: 1 fall in april    Prior Function Prior Level of Function : Independent/Modified Independent             Mobility Comments: independent for mobility ADLs Comments: per chart review/family, pt has not been grocery shopping, eating, or doing medications  appropriately anymore     Hand Dominance   Dominant Hand: Right    Extremity/Trunk Assessment   Upper Extremity Assessment Upper Extremity Assessment: Overall WFL for tasks assessed    Lower Extremity Assessment Lower Extremity Assessment: Overall WFL for tasks assessed (grossly 4/5)    Cervical / Trunk Assessment Cervical / Trunk Assessment: Normal  Communication   Communication: No difficulties;HOH  Cognition Arousal/Alertness: Awake/alert Behavior During Therapy: WFL for tasks assessed/performed Overall Cognitive Status: Within Functional Limits for tasks assessed                                 General Comments: oriented to self and place, unsure of why she came to the hospital, reported 1923        General Comments      Exercises     Assessment/Plan    PT Assessment Patient needs continued PT services  PT Problem List Decreased balance;Decreased mobility;Decreased activity tolerance       PT Treatment Interventions Therapeutic exercise;Balance training;Gait training;Neuromuscular re-education;Therapeutic activities;Functional mobility training;Patient/family education    PT Goals (Current goals can be found in the Care Plan section)  Acute Rehab PT Goals Patient Stated Goal: to go home PT Goal Formulation: With patient Time For Goal Achievement: 02/28/22 Potential to Achieve Goals: Good    Frequency Min 2X/week     Co-evaluation               AM-PAC PT "6 Clicks" Mobility  Outcome Measure Help needed turning from your back to your side while in a flat bed without using bedrails?: None Help needed moving from lying on your back to sitting on the side of a flat bed without using bedrails?: None Help needed moving to and from a bed to a chair (including a wheelchair)?: None Help needed standing up from a chair using your arms (e.g., wheelchair or bedside chair)?: None Help needed to walk in hospital room?: None Help needed  climbing 3-5 steps with a railing? : A Little 6 Click Score: 23    End of Session Equipment Utilized During Treatment: Gait belt Activity Tolerance: Patient tolerated treatment well Patient left: with call bell/phone within reach;in chair;with chair alarm set Nurse Communication: Mobility status PT Visit Diagnosis: Other abnormalities of gait and mobility (R26.89);Difficulty in walking, not elsewhere classified (R26.2);Muscle weakness (generalized) (M62.81)    Time: 8341-9622 PT Time Calculation (min) (ACUTE ONLY): 29 min   Charges:   PT Evaluation $PT Eval Low Complexity: 1 Low PT Treatments $Therapeutic Activity: 23-37 mins       Olga Coaster PT, DPT 10:16 AM,02/14/22

## 2022-02-14 NOTE — Assessment & Plan Note (Addendum)
Recently treated as outpatient for UTI with empiric Keflex.  Presented with altered mental status but without specific GU complaints.  UA concerning for infection and culture grew Enterococcus faecalis sensitive to ampicillin, Macrobid, vancomycin. -- Amoxicillin x3 days

## 2022-02-14 NOTE — Evaluation (Signed)
Occupational Therapy Evaluation Patient Details Name: Brandi [name] MRN: [id number] DOB: 04/26/1927 Today's Date: 02/14/2022   History of Present Illness Pt is a 88 yo female that presented to the ED for AMS. Workup showed afib with RVR and potential acute CHF. PMH of afib, aortic stenosis, HTN, HLD. Recently diagnosed with UTI, HOH.   Clinical Impression   Pt seen for OT evaluation.  Step daughter present for evaluation and voices concerns of pt missing medications and inconsistently grocery shopping.  PTA, pt drove, managed simple meal prep, was in charge of her own medications but showing signs of inconsistency, and managed basic ADLs with modified indep.  Pt is currently managing BADLs with supv-modified indep.  No dyspnea, no LOB, no pain noted at eval.  BUE strength WFL for ADLs.  Pt required cues for thoroughness with ADLs during session, see note for details.  Pt disoriented to month and situation.  OT recommended that medication administration assistance be set up through [facility] where pt resides; pt should not currently be managing meds with her current cognitive level.  This OT has concerns with pt continuing to drive d/t STM deficits noted during eval.  Pt will benefit from [facility] OT to assess safety, consistency, and thoroughness with ADLs and IADLs in the home to enable family to identify a likely higher level of assistance needed within her home environment.  Will continue to follow in the acute setting with plan to administer the SLUMS next session.       Recommendations for follow up therapy are one component of a multi-disciplinary discharge planning process, led by the attending physician.  Recommendations may be updated based on patient status, additional functional criteria and insurance authorization.   Follow Up Recommendations  Home health OT    Assistance Recommended at Discharge Intermittent Supervision/Assistance  Patient can return home with the following Assistance with  cooking/housework;Direct supervision/assist for financial management;A little help with bathing/dressing/bathroom;Direct supervision/assist for medications management    Functional Status Assessment  Patient has had a recent decline in their functional status and demonstrates the ability to make significant improvements in function in a reasonable and predictable amount of time.  Equipment Recommendations  None recommended by OT    Recommendations for Other Services       Precautions / Restrictions Precautions Precautions: Fall Restrictions Weight Bearing Restrictions: No      Mobility Bed Mobility Overal bed mobility: Modified Independent               Patient Response: Cooperative  Transfers Overall transfer level: Modified independent Equipment used: None                      Balance Overall balance assessment: Needs assistance Sitting-balance support: Feet supported Sitting balance-Leahy Scale: Normal Sitting balance - Comments: able to reach to floor level and return to midline for donning shoes without LOB     Standing balance-Leahy Scale: Good Standing balance comment: ADLs in standing without AD or physical assist                           ADL either performed or assessed with clinical judgement   ADL Overall ADL's : Needs assistance/impaired Eating/Feeding: Independent   Grooming: Wash/dry hands;Supervision/safety Grooming Details (indicate cue type and reason): ADL sequencing necessary to ensure thoroughness.  Pt was not going to wash her hands following toileting.  Toilet Transfer: Modified Independent;Regular Toilet;Grab bars   Toileting- Architect and Hygiene: Modified independent;Sit to/from stand       Functional mobility during ADLs: Supervision/safety General ADL Comments: ambulatory in room with supv, no AD.     Vision Baseline Vision/History: 1 Wears glasses Patient Visual Report:  No change from baseline                  Pertinent Vitals/Pain Pain Assessment Pain Assessment: No/denies pain     Hand Dominance Right   Extremity/Trunk Assessment Upper Extremity Assessment Upper Extremity Assessment: Overall WFL for tasks assessed   Lower Extremity Assessment Lower Extremity Assessment: Overall WFL for tasks assessed   Cervical / Trunk Assessment Cervical / Trunk Assessment: Normal   Communication Communication Communication: HOH   Cognition Arousal/Alertness: Awake/alert Behavior During Therapy: WFL for tasks assessed/performed                                   General Comments: Not oriented to date or situation.  Unaware of why she came to the hospital.  STM deficits apparent during evaluation.     General Comments       Exercises Other Exercises Other Exercises: Educ on role of OT in the acute setting, goals, poc   Shoulder Instructions      Home Living Family/patient expects to be discharged to:: Private residence Living Arrangements: Alone Available Help at Discharge: Family;Neighbor;Available PRN/intermittently Type of Home: Independent living facility Home Access: Stairs to enter Entrance Stairs-Number of Steps: 1 Entrance Stairs-Rails: Right Home Layout: One level     Bathroom Shower/Tub: Producer, television/film/video: Handicapped height Bathroom Accessibility: Yes   Home Equipment: Rollator (4 wheels);Grab bars - toilet;Grab bars - tub/shower   Additional Comments: 1 fall in april      Prior Functioning/Environment Prior Level of Function : Independent/Modified Independent             Mobility Comments: independent for mobility ADLs Comments: modified indep with basic self care.  Per daughter pt was managing light meal prep, microwaveable meals, but missing medications and did not use a pill organizer.        OT Problem List: Decreased cognition;Decreased safety awareness      OT  Treatment/Interventions: Self-care/ADL training;Patient/family education;Cognitive remediation/compensation    OT Goals(Current goals can be found in the care plan section) Acute Rehab OT Goals Patient Stated Goal: To go home OT Goal Formulation: With patient/family Time For Goal Achievement: 02/28/22 Potential to Achieve Goals: Good  OT Frequency: Min 2X/week                  AM-PAC OT "6 Clicks" Daily Activity     Outcome Measure Help from another person eating meals?: None Help from another person taking care of personal grooming?: None Help from another person toileting, which includes using toliet, bedpan, or urinal?: A Little Help from another person bathing (including washing, rinsing, drying)?: A Little Help from another person to put on and taking off regular upper body clothing?: None Help from another person to put on and taking off regular lower body clothing?: None 6 Click Score: 22   End of Session    Activity Tolerance: Patient tolerated treatment well Patient left: in bed;with call bell/phone within reach;with family/visitor present  OT Visit Diagnosis: History of falling (Z91.81);Other symptoms and signs involving cognitive function  Time: 8889-1694 OT Time Calculation (min): 29 min Charges:  OT General Charges $OT Visit: 1 Visit OT Evaluation $OT Eval Low Complexity: 1 Low OT Treatments $Self Care/Home Management : 8-22 mins  Danelle Earthly, MS, OTR/L   Otis Dials 02/14/2022, 2:16 PM

## 2022-02-14 NOTE — Progress Notes (Signed)
Progress Note  Patient Name: Brandi [name] [facility] Date of Encounter: 02/14/2022  Primary Cardiologist: Mariah Milling  Subjective   No acute overnight events, dyspnea, chest pain or palpitations. She remains in Afib with improving ventricular response.   Inpatient Medications    Scheduled Meds:  apixaban  2.5 mg Oral BID   calcium carbonate  1,250 mg Oral Q breakfast   ezetimibe  10 mg Oral Daily   feeding supplement  237 mL Oral BID BM   metoprolol succinate  50 mg Oral BID   multivitamin with minerals  1 tablet Oral Daily   Continuous Infusions:  PRN Meds: acetaminophen **OR** acetaminophen, ondansetron **OR** ondansetron (ZOFRAN) IV, senna-docusate, traZODone   Vital Signs    Vitals:   02/13/22 2317 02/14/22 0330 02/14/22 0600 02/14/22 0803  BP: 117/62 (!) 111/55  123/75  Pulse: 84 97  88  Resp: 18 18  18   Temp: 97.9 F (36.6 C) 98.6 F (37 C)  98.1 F (36.7 C)  TempSrc:    Oral  SpO2: 94% 97%  96%  Weight:   38.5 kg   Height:        Intake/Output Summary (Last 24 hours) at 02/14/2022 0911 Last data filed at 02/14/2022 0741 Gross per 24 hour  Intake 1040 ml  Output 3325 ml  Net -2285 ml   Filed Weights   02/13/22 1132 02/14/22 0600  Weight: 39.8 kg 38.5 kg    Telemetry    Afib with ventricular rates in the 90s to low 100s bpm - Personally Reviewed  ECG    No new tracings - Personally Reviewed  Physical Exam   GEN: No acute distress.   Neck: No JVD. Cardiac: IRIR, II/Vi systolic murmur RUSB, no rubs, or gallops.  Respiratory: Diminished breath sounds bilaterally.  GI: Soft, nontender, non-distended.   MS: No edema; No deformity. Neuro:  Alert and oriented x 3; Nonfocal.  Psych: Normal affect.  Labs    Chemistry Recent Labs  Lab 02/12/22 1611 02/13/22 0600 02/14/22 0514  NA 135 138 137  K 5.4* 3.8 3.5  CL 101 106 98  CO2 26 28 32  GLUCOSE 131* 95 109*  BUN 28* 21 32*  CREATININE 1.32* 1.00 1.12*  CALCIUM 9.3 8.5* 9.2  PROT 6.5  --   --    ALBUMIN 3.1*  --   --   AST 59*  --   --   ALT 22  --   --   ALKPHOS 73  --   --   BILITOT 0.9  --   --   GFRNONAA 38* 53* 46*  ANIONGAP 8 4* 7     Hematology Recent Labs  Lab 02/12/22 1611 02/13/22 0600  WBC 11.1* 10.1  RBC 4.38 4.22  HGB 13.7 13.4  HCT 43.6 40.8  MCV 99.5 96.7  MCH 31.3 31.8  MCHC 31.4 32.8  RDW 14.6 14.6  PLT 390 324    Cardiac EnzymesNo results for input(s): "TROPONINI" in the last 168 hours. No results for input(s): "TROPIPOC" in the last 168 hours.   BNP Recent Labs  Lab 02/12/22 1611  BNP 1,170.7*     DDimer No results for input(s): "DDIMER" in the last 168 hours.   Radiology    CT HEAD WO CONTRAST (02/14/22)  Result Date: 02/12/2022 IMPRESSION: 1. No acute intracranial abnormality. 2. Right sphenoid sinusitis. Electronically Signed   By: 02/14/2022 M.D.   On: 02/12/2022 16:37   DG Chest [facility] 1 View  Result  Date: 02/12/2022 IMPRESSION: Cardiomegaly. Central pulmonary vessels are prominent. There is prominence of interstitial markings in the parahilar regions and lower lung fields suggesting mild interstitial edema or interstitial pneumonia. Small bilateral pleural effusions. Electronically Signed   By: Ernie Avena M.D.   On: 02/12/2022 16:27    Cardiac Studies   2D echo 02/13/2022: 1. Left ventricular ejection fraction, by estimation, is 60 to 65%. The  left ventricle has normal function. The left ventricle has no regional  wall motion abnormalities. Left ventricular diastolic parameters are  consistent with Grade II diastolic  dysfunction (pseudonormalization).   2. Right ventricular systolic function is low normal. The right  ventricular size is normal. There is severely elevated pulmonary artery  systolic pressure. The estimated right ventricular systolic pressure is  62.9 mmHg.   3. Left atrial size was severely dilated.   4. Right atrial size was moderately dilated.   5. The mitral valve is degenerative. Mild to  moderate mitral valve  regurgitation.   6. The aortic valve is calcified. Aortic valve regurgitation is trivial.  Mild aortic valve stenosis. Aortic valve mean gradient measures 12.0 mmHg.   7. The inferior vena cava is dilated in size with <50% respiratory  variability, suggesting right atrial pressure of 15 mmHg. __________   2D echo 02/17/2020: 1. Thicken of anterioe MV leafet Vegation can not be excluded. Probable  just calcium build up.   2. Left ventricular ejection fraction, by estimation, is 60 to 65%. The  left ventricle has normal function. The left ventricle has no regional  wall motion abnormalities. There is mild left ventricular hypertrophy.  Left ventricular diastolic parameters  are consistent with Grade I diastolic dysfunction (impaired relaxation).   3. Right ventricular systolic function is normal. The right ventricular  size is normal.   4. Left atrial size was moderately dilated.   5. The mitral valve is myxomatous. Prolapse of the anterior leaflet  (posterior leaflet not well visualized) Moderate mitral valve  regurgitation. No evidence of mitral stenosis.   6. The aortic valve is grossly normal. Aortic valve regurgitation is not  visualized. Mild aortic valve stenosis. __________   Luci Bank patch 08/2019: Rhythm is atrial fibrillation, 100% burden ranging from 52-199 bpm (avg of 96 bpm).   4 Ventricular Tachycardia (unable to exclude atrial fibrillation with aberrancy) runs occurred, the run with the fastest interval lasting 9 beats with a max rate of 193 bpm, the longest lasting 10 beats with an avg rate of 163 bpm.  No patient triggered   Isolated VEs were occasional (1.8%, 32223), VE Couplets were rare (<1.0%, 58), and VE Triplets were rare (<1.0%, 2). Ventricular Bigeminy and Trigeminy were present.   Patient triggered events were not associated with significant arrhythmia __________   2D echo 07/20/2018: - Left ventricle: The cavity size was normal. Wall  thickness was    increased in a pattern of mild LVH. Systolic function was    vigorous. The estimated ejection fraction was in the range of 65%    to 70%. Mitral stenosis precludes accurate assessment of    diastolic function.  - Aortic valve: Trileaflet; mildly thickened leaflets. Cusp    separation was mildly reduced. Transvalvular velocity was    increased. There was mild stenosis.  - Mitral valve: Calcified annulus. Severely thickened leaflets .    Leaflet separation was mildly reduced. Mild prolapse. The    findings are consistent with mild stenosis. There was moderate to    severe regurgitation directed posteriorly.  -  Left atrium: The atrium was severely dilated.  - Right atrium: The atrium was mildly dilated.  - Pulmonary arteries: Systolic pressure was moderately increased,    in the range of 40 mm Hg to 45 mm Hg plus central venous/right    atrial pressure.  - Pericardium, extracardiac: A trivial pericardial effusion was    identified. ___________   2D echo 10/23/2016: - Procedure narrative: Transthoracic echocardiography. Image    quality was fair. The study was technically difficult, as a    result of poor acoustic windows.  - Left ventricle: The cavity size was normal. Wall thickness was    increased in a pattern of mild LVH. Systolic function was normal.    The estimated ejection fraction was in the range of 60% to 65%.    Wall motion was normal; there were no regional wall motion    abnormalities. Features are consistent with a pseudonormal left    ventricular filling pattern, with concomitant abnormal relaxation    and increased filling pressure (grade 2 diastolic dysfunction).  - Aortic valve: There appears to be mild to moderate stenosis:    based on gradient and dimensionless index. Valve area (VTI): 0.99    cm^2. Valve area (Vmean): 0.9 cm^2.  - Mitral valve: Moderate myxomatous degeneration. Prolapse noted of    anterior mitral valve leaflet. There was moderate  to severe    regurgitation.  - Left atrium: The atrium was severely dilated.  - Right atrium: The atrium was moderately dilated.  - Tricuspid valve: There was moderate-severe regurgitation.  - Pulmonary arteries: Systolic pressure was moderately increased.    PA peak pressure: 54 mm Hg (S). __________   2D echo 07/08/2011: - Left ventricle: The cavity size was normal. Wall thickness    was normal. Systolic function was normal. The estimated    ejection fraction was in the range of 55% to 60%. Wall    motion was normal; there were no regional wall motion    abnormalities. Doppler parameters are consistent with    abnormal left ventricular relaxation (grade 1 diastolic    dysfunction).  - Aortic valve: Transvalvular velocity was mildly elevated    consistent with borderline to mild AV stenosis. Trivial    regurgitation.  - Mitral valve: Mildly thickened leaflets . Moderate    myxomatous degeneration. Prolapse, most notable of the    anterior leaflet. Mild to Moderate regurgitation.  - Tricuspid valve: Mild regurgitation.  - Pulmonary arteries: Systolic pressure was within the    normal range.  Patient Profile     88 y.o. female with history of PAF on renally dosed Eliquis, HFpEF, aortic stenosis, MVP with moderate to severe mitral regurgitation, tricuspid regurgitation, HTN, and HLD who is being seen today for the evaluation of Afib with RVR and HFpEF at the request of Dr. Arville Care.  Assessment & Plan    1. Afib with RVR: -She remains in Afib with improving ventricular rates -Titrate Toprol XL to 50 mg bid -Stop diltiazem -She reports missing a dose of Eliquis on 02/12/2022, and also did not receive her Eliquis on the evening of 6/28 -Will plan for rate control at this time with recommendation for outpatient DCCV once she has been adequately anticoagulated, pending adequate rate control during admission (has previously spontaneously converted) -If her ventricular rates are difficult  to control, she would need a TEE/DCCV prior to discharge -CHADS2VASc at least 5 (CHF, HTN, age x 2, sex category) -Eliquis 2.5 mg bid (age and weight) -Needs  to ambulate to assess ventricular rates and symptoms    2. HFpEF: -Likely exacerbated by Afib with RVR -Volume improved -Hold IV Lasix with slight up trend in renal function -Echo as above   3. Valvular heart disease: -Echo as above -Outpatient follow up   4. Elevated high sensitivity troponin: -Mildly elevated and flat trending, not consistent with ACS -Echo with preserved LVSF and normal wall motion  -No symptoms concerning for ischemia -Likely supply demand ischemia in the setting of Afib with RVR, HFpEF, and AKI -No plans for inpatient ischemic evaluation at this time   5. AKI: -Hold Lasix      For questions or updates, please contact [facility] Please consult [email or web address] for contact info under Cardiology/STEMI.    Signed, Eula Listen, PA-C [facility] Pager: [phone] 02/14/2022, 9:11 AM

## 2022-02-14 NOTE — TOC Initial Note (Addendum)
Transition of Care [facility]) - Initial/Assessment Note    Patient Details  Name: Brandi [name] MRN: [id number] Date of Birth: 12-07-1926  Transition of Care [facility]) CM/SW Contact:    Liliana Cline, LCSW Phone Number: 02/14/2022, 10:44 AM  Clinical Narrative:                 CSW spoke with patient's daughter Erskine Squibb via phone. Patient resides at [facility].  PCP is Dr. Darrick Huntsman. [facility].  Patient recently used the Outpatient PT gym.  Per PT, patient could benefit from PCA assistance with med management, groceries, and meal prep.  CSW spoke with Crystal at [facility] who stated they can set up home care aide to help with groceries and meal prep, but she would need [facility] for medication management. Crystal stated the Home Care Staff at [facility] will follow up with family about getting aide services set up.  CSW informed Erskine Squibb of this who is agreeable with HHRN, PT, and OT also being set up. Referral made to [facility] with [facility]. Daughter to transport home when patient is DC.  4:33- Call from [employer]- [place] [facility]. Cherese stated patient and family would like to use their PT/OT services. Cherese stated they will have patient's prescriptions sent in bubble packs and use their home care services to remind patient to take her medications. Cherese stated no HHRN needed. Cherese requested "ambulatory outpatient PT/OT orders" be faxed to her at [phone]. Per DO, she will include these in patient's DC summary. TOC handoff updated. Notified Barbara Cower with [facility] to cancel [facility] referral.  Updated daughter Erskine Squibb via phone.  Expected Discharge Plan: Home w Home Health Services Barriers to Discharge: Continued Medical Work up   Patient Goals and CMS Choice Patient states their goals for this hospitalization and ongoing recovery are:: home with home health CMS Medicare.gov Compare Post Acute Care list provided to:: Patient Represenative  (must comment) Choice offered to / list presented to : Adult Children  Expected Discharge Plan and Services Expected Discharge Plan: Home w Home Health Services       Living arrangements for the past 2 months: Single Family Home                           HH Arranged: PT, OT, RN [facility] Agency: [facility] Date HH Agency Contacted: 02/14/22   Representative spoke with at [facility] Agency: Barbara Cower  Prior Living Arrangements/Services Living arrangements for the past 2 months: Single Family Home Lives with:: Self Patient language and need for interpreter reviewed:: Yes Do you feel safe going back to the place where you live?: Yes      Need for Family Participation in Patient Care: Yes (Comment) Care giver support system in place?: Yes (comment)   Criminal Activity/Legal Involvement Pertinent to Current Situation/Hospitalization: No - Comment as needed  Activities of Daily Living Home Assistive Devices/Equipment: Dan Humphreys (specify type) ADL Screening (condition at time of admission) Patient's cognitive ability adequate to safely complete daily activities?: No Is the patient deaf or have difficulty hearing?: Yes Does the patient have difficulty seeing, even when wearing glasses/contacts?: No Does the patient have difficulty concentrating, remembering, or making decisions?: No Patient able to express need for assistance with ADLs?: Yes Does the patient have difficulty dressing or bathing?: No Independently performs ADLs?: Yes (appropriate for developmental age) Does the patient have difficulty walking or climbing  stairs?: No Weakness of Legs: None Weakness of Arms/Hands: None  Permission Sought/Granted Permission sought to share information with : Facility Industrial/product designer granted to share information with : Yes, Verbal Permission Granted (by daughter Erskine Squibb)     Permission granted to share info w AGENCY: [facility], [facility]        Emotional Assessment          Alcohol / Substance Use: Not Applicable Psych Involvement: No (comment)  Admission diagnosis:  Acute CHF (congestive heart failure) (HCC) [I50.9] Atrial fibrillation with RVR (HCC) [I48.91] Congestive heart failure, unspecified HF chronicity, unspecified heart failure type (HCC) [I50.9] Patient Active Problem List   Diagnosis Date Noted   Protein-calorie malnutrition, severe 02/13/2022   Hearing aid worn 02/13/2022   Delirium 02/12/2022   Enterococcus UTI 02/12/2022   History of fall within past 90 days 02/12/2022   Atrial fibrillation with rapid ventricular response (HCC) 02/12/2022   Uncontrolled hypertension 02/12/2022   Dyslipidemia 02/12/2022   Acute on chronic diastolic CHF (congestive heart failure) (HCC) 02/12/2022   Osteopenia 01/10/2022   Aortic atherosclerosis (HCC) 01/10/2022   Myalgia due to statin 08/14/2021   Cognitive attention deficit 02/13/2021   Hospital discharge follow-up 02/28/2020   Abnormal intentional weight loss 02/27/2020   Elevated troponin I level 02/16/2020   Acute metabolic encephalopathy 02/16/2020   HLD (hyperlipidemia) 02/16/2020   Acquired thrombophilia (HCC) 02/10/2020   Dupuytren's contracture of right hand 11/06/2019   Atrial fibrillation (HCC) 11/06/2019   Close exposure to COVID-19 virus 01/25/2019   Long-term current use of high risk medication other than anticoagulant 05/24/2017   Aortic valve stenosis 11/09/2016   Mitral regurgitation 11/09/2016   Pulmonary HTN (HCC) 11/09/2016   Closed displaced fracture of olecranon process with intraarticular extension of right ulna 07/06/2014   Dry mouth 01/31/2014   Encounter for preventive health examination 01/26/2013   Edema 06/26/2011   Essential hypertension 07/24/2009   Chronic venous insufficiency 01/30/2009   Herpes zoster 10/12/2008   Hyperlipidemia 10/12/2008   HEARING LOSS, TINNITUS 10/12/2008   Osteoporosis 10/12/2008   PCP:  [name], MD [facility]    [phone] [place], [place] - [address] [place] [address] Phone: [phone] Fax: [phone]  [facility] Delivery) - [place], [place] - [address] [facility] [address] [place] [address] [place] [address] Phone: [phone] Fax: [phone]  [facility] Delivery ([facility] ) - [place], [place] - [address] [place] [address] Phone: [phone] Fax: [phone]     Social Determinants of Health (SDOH) Interventions    Readmission Risk Interventions     No data to display

## 2022-02-15 DIAGNOSIS — I4891 Unspecified atrial fibrillation: Secondary | ICD-10-CM | POA: Diagnosis not present

## 2022-02-15 DIAGNOSIS — I248 Other forms of acute ischemic heart disease: Secondary | ICD-10-CM

## 2022-02-15 DIAGNOSIS — I5033 Acute on chronic diastolic (congestive) heart failure: Secondary | ICD-10-CM | POA: Diagnosis not present

## 2022-02-15 LAB — BASIC METABOLIC PANEL
Anion gap: 8 (ref 5–15)
BUN: 30 mg/dL — ABNORMAL HIGH (ref 8–23)
CO2: 29 mmol/L (ref 22–32)
Calcium: 9.3 mg/dL (ref 8.9–10.3)
Chloride: 97 mmol/L — ABNORMAL LOW (ref 98–111)
Creatinine, Ser: 1.15 mg/dL — ABNORMAL HIGH (ref 0.44–1.00)
GFR, Estimated: 45 mL/min — ABNORMAL LOW (ref >60)
Glucose, Bld: 117 mg/dL — ABNORMAL HIGH (ref 70–99)
Potassium: 3.5 mmol/L (ref 3.5–5.1)
Sodium: 134 mmol/L — ABNORMAL LOW (ref 135–145)

## 2022-02-15 LAB — MAGNESIUM: Magnesium: 2.1 mg/dL (ref 1.7–2.4)

## 2022-02-15 MED ORDER — METOPROLOL SUCCINATE ER 25 MG PO TB24
75.0000 mg | ORAL_TABLET | Freq: Two times a day (BID) | ORAL | 1 refills | Status: AC
Start: 2022-02-15 — End: ?

## 2022-02-15 MED ORDER — METOPROLOL SUCCINATE ER 50 MG PO TB24
75.0000 mg | ORAL_TABLET | Freq: Two times a day (BID) | ORAL | Status: DC
  Administered 2022-02-15: 75 mg via ORAL
  Filled 2022-02-15: qty 1

## 2022-02-15 MED ORDER — AMOXICILLIN 250 MG PO CAPS: 250.0000 mg | ORAL_CAPSULE | Freq: Two times a day (BID) | ORAL | 0 refills | Status: AC

## 2022-02-15 MED ORDER — FUROSEMIDE 40 MG PO TABS: 40.0000 mg | ORAL_TABLET | Freq: Every day | ORAL | 1 refills | Status: AC

## 2022-02-15 MED ORDER — POTASSIUM CHLORIDE CRYS ER 20 MEQ PO TBCR
40.0000 meq | EXTENDED_RELEASE_TABLET | Freq: Once | ORAL | Status: AC
  Administered 2022-02-15: 40 meq via ORAL
  Filled 2022-02-15: qty 2

## 2022-02-15 MED ORDER — ENSURE ENLIVE PO LIQD: 237.0000 mL | Freq: Two times a day (BID) | ORAL | 12 refills | Status: AC

## 2022-02-15 NOTE — Progress Notes (Signed)
Progress Note  Patient Name: Brandi [name] [facility] Date of Encounter: 02/15/2022  Primary Cardiologist: Mariah Milling  Subjective   She ambulated yesterday with ventricular rates into the 110s to 130s bpm, without symptoms. No acute overnight events, dyspnea, chest pain or palpitations. Reports she feels "fine" this morning. She remains in Afib with ventricular rates largely in the low 100s bpm trending into the 120s to 140s bpm at times.BP stable in the 110s to 140s mmHg systolic.    Inpatient Medications    Scheduled Meds:  amoxicillin  250 mg Oral Q12H   apixaban  2.5 mg Oral BID   calcium carbonate  1,250 mg Oral Q breakfast   ezetimibe  10 mg Oral Daily   feeding supplement  237 mL Oral BID BM   furosemide  40 mg Oral Daily   metoprolol succinate  50 mg Oral BID   multivitamin with minerals  1 tablet Oral Daily   Continuous Infusions:  PRN Meds: acetaminophen **OR** acetaminophen, ondansetron **OR** ondansetron (ZOFRAN) IV, senna-docusate, traZODone   Vital Signs    Vitals:   02/14/22 1907 02/14/22 2114 02/14/22 2350 02/15/22 0434  BP: 134/63 127/77 (!) 114/54 119/71  Pulse: 92 (!) 107 (!) 58 (!) 51  Resp: 17  16 18   Temp: 98.1 F (36.7 C)  97.7 F (36.5 C) (!) 97.5 F (36.4 C)  TempSrc:   Oral Oral  SpO2: 94% 99% 95% 98%  Weight:    38.7 kg  Height:        Intake/Output Summary (Last 24 hours) at 02/15/2022 0730 Last data filed at 02/15/2022 0534 Gross per 24 hour  Intake 720 ml  Output 2225 ml  Net -1505 ml    Filed Weights   02/13/22 1132 02/14/22 0600 02/15/22 0434  Weight: 39.8 kg 38.5 kg 38.7 kg    Telemetry    Afib with ventricular rates in the low 100s bpm trending into the 120s to 140s bpm at times - Personally Reviewed  ECG    No new tracings - Personally Reviewed  Physical Exam   GEN: No acute distress. Elderly and frail appearing.  Neck: No JVD. Cardiac: IRIR, II/VI systolic murmur RUSB, no rubs, or gallops.  Respiratory: Diminished breath  sounds bilaterally with poor inspiratory effort.  GI: Soft, nontender, non-distended.   MS: No edema; No deformity. Neuro:  Alert and oriented x 3; Nonfocal.  Psych: Normal affect.  Labs    Chemistry Recent Labs  Lab 02/12/22 1611 02/13/22 0600 02/14/22 0514 02/15/22 0543  NA 135 138 137 134*  K 5.4* 3.8 3.5 3.5  CL 101 106 98 97*  CO2 26 28 32 29  GLUCOSE 131* 95 109* 117*  BUN 28* 21 32* 30*  CREATININE 1.32* 1.00 1.12* 1.15*  CALCIUM 9.3 8.5* 9.2 9.3  PROT 6.5  --   --   --   ALBUMIN 3.1*  --   --   --   AST 59*  --   --   --   ALT 22  --   --   --   ALKPHOS 73  --   --   --   BILITOT 0.9  --   --   --   GFRNONAA 38* 53* 46* 45*  ANIONGAP 8 4* 7 8      Hematology Recent Labs  Lab 02/12/22 1611 02/13/22 0600  WBC 11.1* 10.1  RBC 4.38 4.22  HGB 13.7 13.4  HCT 43.6 40.8  MCV 99.5 96.7  MCH  31.3 31.8  MCHC 31.4 32.8  RDW 14.6 14.6  PLT 390 324     Cardiac EnzymesNo results for input(s): "TROPONINI" in the last 168 hours. No results for input(s): "TROPIPOC" in the last 168 hours.   BNP Recent Labs  Lab 02/12/22 1611  BNP 1,170.7*      DDimer No results for input(s): "DDIMER" in the last 168 hours.   Radiology    CT HEAD WO CONTRAST ( )  Result Date: 02/12/2022 IMPRESSION: 1. No acute intracranial abnormality. 2. Right sphenoid sinusitis. Electronically Signed   By: Allegra Lai M.D.   On: 02/12/2022 16:37   DG Chest Port 1 View  Result Date: 02/12/2022 IMPRESSION: Cardiomegaly. Central pulmonary vessels are prominent. There is prominence of interstitial markings in the parahilar regions and lower lung fields suggesting mild interstitial edema or interstitial pneumonia. Small bilateral pleural effusions. Electronically Signed   By: Ernie Avena M.D.   On: 02/12/2022 16:27    Cardiac Studies   2D echo 02/13/2022: 1. Left ventricular ejection fraction, by estimation, is 60 to 65%. The  left ventricle has normal function. The left  ventricle has no regional  wall motion abnormalities. Left ventricular diastolic parameters are  consistent with Grade II diastolic  dysfunction (pseudonormalization).   2. Right ventricular systolic function is low normal. The right  ventricular size is normal. There is severely elevated pulmonary artery  systolic pressure. The estimated right ventricular systolic pressure is  62.9 mmHg.   3. Left atrial size was severely dilated.   4. Right atrial size was moderately dilated.   5. The mitral valve is degenerative. Mild to moderate mitral valve  regurgitation.   6. The aortic valve is calcified. Aortic valve regurgitation is trivial.  Mild aortic valve stenosis. Aortic valve mean gradient measures 12.0 mmHg.   7. The inferior vena cava is dilated in size with <50% respiratory  variability, suggesting right atrial pressure of 15 mmHg. __________   2D echo 02/17/2020: 1. Thicken of anterioe MV leafet Vegation can not be excluded. Probable  just calcium build up.   2. Left ventricular ejection fraction, by estimation, is 60 to 65%. The  left ventricle has normal function. The left ventricle has no regional  wall motion abnormalities. There is mild left ventricular hypertrophy.  Left ventricular diastolic parameters  are consistent with Grade I diastolic dysfunction (impaired relaxation).   3. Right ventricular systolic function is normal. The right ventricular  size is normal.   4. Left atrial size was moderately dilated.   5. The mitral valve is myxomatous. Prolapse of the anterior leaflet  (posterior leaflet not well visualized) Moderate mitral valve  regurgitation. No evidence of mitral stenosis.   6. The aortic valve is grossly normal. Aortic valve regurgitation is not  visualized. Mild aortic valve stenosis. __________   Luci Bank patch 08/2019: Rhythm is atrial fibrillation, 100% burden ranging from 52-199 bpm (avg of 96 bpm).   4 Ventricular Tachycardia (unable to exclude atrial  fibrillation with aberrancy) runs occurred, the run with the fastest interval lasting 9 beats with a max rate of 193 bpm, the longest lasting 10 beats with an avg rate of 163 bpm.  No patient triggered   Isolated VEs were occasional (1.8%, 32223), VE Couplets were rare (<1.0%, 58), and VE Triplets were rare (<1.0%, 2). Ventricular Bigeminy and Trigeminy were present.   Patient triggered events were not associated with significant arrhythmia __________   2D echo 07/20/2018: - Left ventricle: The cavity size was normal.  Wall thickness was    increased in a pattern of mild LVH. Systolic function was    vigorous. The estimated ejection fraction was in the range of 65%    to 70%. Mitral stenosis precludes accurate assessment of    diastolic function.  - Aortic valve: Trileaflet; mildly thickened leaflets. Cusp    separation was mildly reduced. Transvalvular velocity was    increased. There was mild stenosis.  - Mitral valve: Calcified annulus. Severely thickened leaflets .    Leaflet separation was mildly reduced. Mild prolapse. The    findings are consistent with mild stenosis. There was moderate to    severe regurgitation directed posteriorly.  - Left atrium: The atrium was severely dilated.  - Right atrium: The atrium was mildly dilated.  - Pulmonary arteries: Systolic pressure was moderately increased,    in the range of 40 mm Hg to 45 mm Hg plus central venous/right    atrial pressure.  - Pericardium, extracardiac: A trivial pericardial effusion was    identified. ___________   2D echo 10/23/2016: - Procedure narrative: Transthoracic echocardiography. Image    quality was fair. The study was technically difficult, as a    result of poor acoustic windows.  - Left ventricle: The cavity size was normal. Wall thickness was    increased in a pattern of mild LVH. Systolic function was normal.    The estimated ejection fraction was in the range of 60% to 65%.    Wall motion was normal;  there were no regional wall motion    abnormalities. Features are consistent with a pseudonormal left    ventricular filling pattern, with concomitant abnormal relaxation    and increased filling pressure (grade 2 diastolic dysfunction).  - Aortic valve: There appears to be mild to moderate stenosis:    based on gradient and dimensionless index. Valve area (VTI): 0.99    cm^2. Valve area (Vmean): 0.9 cm^2.  - Mitral valve: Moderate myxomatous degeneration. Prolapse noted of    anterior mitral valve leaflet. There was moderate to severe    regurgitation.  - Left atrium: The atrium was severely dilated.  - Right atrium: The atrium was moderately dilated.  - Tricuspid valve: There was moderate-severe regurgitation.  - Pulmonary arteries: Systolic pressure was moderately increased.    PA peak pressure: 54 mm Hg (S). __________   2D echo 07/08/2011: - Left ventricle: The cavity size was normal. Wall thickness    was normal. Systolic function was normal. The estimated    ejection fraction was in the range of 55% to 60%. Wall    motion was normal; there were no regional wall motion    abnormalities. Doppler parameters are consistent with    abnormal left ventricular relaxation (grade 1 diastolic    dysfunction).  - Aortic valve: Transvalvular velocity was mildly elevated    consistent with borderline to mild AV stenosis. Trivial    regurgitation.  - Mitral valve: Mildly thickened leaflets . Moderate    myxomatous degeneration. Prolapse, most notable of the    anterior leaflet. Mild to Moderate regurgitation.  - Tricuspid valve: Mild regurgitation.  - Pulmonary arteries: Systolic pressure was within the    normal range.  Patient Profile     88 y.o. female with history of PAF on renally dosed Eliquis, HFpEF, aortic stenosis, MVP with moderate to severe mitral regurgitation, tricuspid regurgitation, HTN, and HLD who is being seen today for the evaluation of Afib with RVR and HFpEF at the  request  of Dr. Arville Care.  Assessment & Plan    1. Afib with RVR: -She remains in Afib with improving ventricular rates -Titrate Toprol XL to 75 mg bid -She reports missing a dose of Eliquis on 02/12/2022, and also did not receive her Eliquis on the evening of 6/28 -Will plan for rate control at this time with recommendation for outpatient DCCV once she has been adequately anticoagulated, pending adequate rate control during admission (has previously spontaneously converted) -If her ventricular rates are difficult to control, she would need a TEE/DCCV prior to discharge -CHADS2VASc at least 5 (CHF, HTN, age x 2, sex category) -Eliquis 2.5 mg bid (age and weight) -She has ambulated in the hallway without symptoms  -Avoid digoxin given advanced age, frail state, and underlying renal dysfunction    2. HFpEF: -Likely exacerbated by Afib with RVR -Volume improved Continue low dose Lasix, will need gentle diuresis while in Afib -Echo as above   3. Valvular heart disease: -Echo as above -May need to consider a TEE as an outpatient, though she may not be a good candidate for invasive repair given age and comorbid conditions -Outpatient follow up   4. Elevated high sensitivity troponin: -Mildly elevated and flat trending, not consistent with ACS -Echo with preserved LVSF and normal wall motion  -No symptoms concerning for ischemia -Likely supply demand ischemia in the setting of Afib with RVR, HFpEF, and AKI -No plans for inpatient ischemic evaluation at this time   5. AKI: -Stable      For questions or updates, please contact [facility] Please consult [email or web address] for contact info under Cardiology/STEMI.    Signed, Eula Listen, PA-C [facility] Pager: [phone] 02/15/2022, 7:30 AM

## 2022-02-15 NOTE — Discharge Summary (Signed)
Physician Discharge Summary   Patient: Brandi [name] MRN: [id number] DOB: [birth year]  Admit date:     02/12/2022  Discharge date: 02/15/22  Discharge Physician: Pennie Banter   PCP: [name], MD   Recommendations at discharge:    Follow up with Cardiology as scheduled on July 19th Follow up with Primary care in 1-2 weeks Repeat BMP, Mg, CBC in 1-2 weeks Follow up with Home health RN, PT and OT for therapy and monitoring with medication changes Follow up with facility staff regarding medication reminders and other assistance they have to offer. Outpatient ambulatory PT/OT at [facility]  Discharge Diagnoses: Principal Problem:   Acute on chronic diastolic CHF (congestive heart failure) (HCC) Active Problems:   Atrial fibrillation with rapid ventricular response (HCC)   Uncontrolled hypertension   Elevated troponin I level   Dyslipidemia   Acute metabolic encephalopathy   Enterococcus UTI   Protein-calorie malnutrition, severe   Hearing aid worn  Resolved Problems:   Acute encephalopathy  Hospital Course: 88 y.o. female with medical history significant for essential hypertension, dyslipidemia, moderate to severe mitral regurgitation and paroxysmal atrial fibrillation on Eliquis, who presented to the ER on 02/12/2022 for evaluation of altered mental status with confusion and forgetfulness per her family which had been worsening over the last couple of days.  She was seen at urgent care on 6/20 and diagnosed with UTI, reports taking all the antibiotics.  Daughter visiting from [place] reported on admission she may not have taken it.  Patient denies fever chills, dysuria or frequency, flank pain abdominal pain.  In the ED, patient was in A-fib with RVR initially heart rate as high as 191.  She was treated with IV Cardizem boluses with improvement of heart rate into the 80s.  Urinalysis with some signs of infection including moderate leukocytes and rare bacteria.  Patient was  started on empiric IV Rocephin pending urine cultures.   7/1 -- pt seen with family at bedside this AM. She reports feeling very well and ready to go home.  HR's are better on increased Toprol-Xl dose per cardiology.  Pt is asymptomatic at times HR increases (with activity).  Cardiology has cleared pt for d/c and patient and family are agreeable.  She has scheduled follow up in a couple of weeks.  Clinically improved and stable for discharge today.    Assessment and Plan: * Acute on chronic diastolic CHF (congestive heart failure) (HCC) Acute decompensation most likely due to A-fib with RVR previous echo showed normal EF of 60 to 65%, grade 2 diastolic dysfunction, severely elevated PASP, mild to moderate MR, mild AS. Started on IV Lasix on admission -- Cardiology consulted -- Treated with IV Lasix --Transitioned to oral Lasix 40 mg daily -- Toprol-XL, increased to 75 mg BID -- Monitor renal function and electrolytes at follow up -- Daily weights at home  Atrial fibrillation with rapid ventricular response (HCC) Presented in A-fib RVR with heart rate up to 191, improved with IV Cardizem bolus and short acting oral Cardizem.  6/30: Heart rates have improved but elevated to 133 with ambulation, improved to 110s at rest --Cardiology consulted -- Initially treated with oral Cardizem  -- Toprol-XL increased to 75 mg BID --Continue Eliquis 2.5 mg twice daily -- Monitor on telemetry -- Monitor and replace electrolytes  Uncontrolled hypertension BPs in the ED were 150/79, up to 157/114.  BPs have been rather labile but fairly well controlled today. -- Continue Toprol-XL. - As needed IV labetalol  as needed  Elevated troponin I level Most likely demand ischemia.  Patient does not have chest pain or acute ischemic changes on EKG.  Dyslipidemia Continue Zetia  Hearing aid worn Patient's left hearing aid tip became detached and is lodged in patient's ear.  Visualized on otoscope.  Not  causing any pain or issues. -- ENT consulted for assistance. On their exam, no foreign body was seen -- Outpatient follow-up  Protein-calorie malnutrition, severe Related to chronic illness (A-fib, advanced age) as evidenced by percent weight loss, moderate to severe muscle depletion, moderate fat depletion. --Appreciate dietitian recommendations -- Liberalize diet -- Ensure supplement drinks and multivitamin started  Enterococcus UTI Recently treated as outpatient for UTI with empiric Keflex.  Presented with altered mental status but without specific GU complaints.  UA concerning for infection and culture grew Enterococcus faecalis sensitive to ampicillin, Macrobid, vancomycin. -- Amoxicillin - 5 more doses at d/c to complete course   Acute metabolic encephalopathy Present on admission likely due to UTI. Noncontrast head CT was negative. Improved with treatment of UTI. -- Manage UTI as outlined -- Delirium precautions -- Avoid sedating medications         Consultants: Cardiology Procedures performed: Echocardiogram     Disposition: Assisted living with Home health   Diet recommendation:  Discharge Diet Orders (From admission, onward)     Start     Ordered   02/15/22 0000  Diet - low sodium heart healthy        02/15/22 1413           Regular diet DISCHARGE MEDICATION: Allergies as of 02/15/2022       Reactions   Codeine    REACTION: N \\T \ V   Statins    REACTION: INTOLERANCE: muscle aches   Sulfonamide Derivatives    REACTION: N \\T \ V        Medication List     TAKE these medications    amoxicillin 250 MG capsule Commonly known as: AMOXIL Take 1 capsule (250 mg total) by mouth every 12 (twelve) hours for 5 doses.   CALCIUM 1200 PO Take 600 mg by mouth.   Calcium 600 1500 (600 Ca) MG Tabs tablet Generic drug: calcium carbonate Take by mouth.   Eliquis 2.5 MG Tabs tablet Generic drug: apixaban TAKE 1 TABLET BY MOUTH TWICE  DAILY    ezetimibe 10 MG tablet Commonly known as: ZETIA TAKE 1 TABLET BY MOUTH DAILY   feeding supplement Liqd Take 237 mLs by mouth 2 (two) times daily between meals. Start taking on: February 16, 2022   furosemide 40 MG tablet Commonly known as: LASIX Take 1 tablet (40 mg total) by mouth daily. Start taking on: February 16, 2022 What changed:  medication strength See the new instructions.   metoprolol succinate 25 MG 24 hr tablet Commonly known as: TOPROL-XL Take 3 tablets (75 mg total) by mouth 2 (two) times daily. Take with or immediately following a meal. What changed:  medication strength See the new instructions.   multivitamin tablet Take 1 tablet by mouth daily.   potassium chloride 10 MEQ tablet Commonly known as: KLOR-CON M TAKE 1 TABLET BY MOUTH  DAILY        Discharge Exam: Filed Weights   02/13/22 1132 02/14/22 0600 02/15/22 0434  Weight: 39.8 kg 38.5 kg 38.7 kg   General exam: awake, alert, no acute distress, frail, pleasant HEENT: moist mucus membranes, hard of hearing  Respiratory system: CTAB, no wheezes, rales or rhonchi, normal respiratory  effort. Cardiovascular system: normal S1/S2, RRR, systolic murmur heard, no pedal edema.   Central nervous system: A&O x3. no gross focal neurologic deficits, normal speech Extremities: moves all, no edema, normal tone Skin: dry, intact, normal temperature Psychiatry: normal mood, congruent affect   Condition at discharge: stable  The results of significant diagnostics from this hospitalization (including imaging, microbiology, ancillary and laboratory) are listed below for reference.   Imaging Studies: ECHOCARDIOGRAM COMPLETE  Result Date: 02/13/2022    ECHOCARDIOGRAM REPORT   Patient Name:   CRISTALLE ROHM [facility] Date of Exam: 02/13/2022 Medical Rec #:  [id number]    Height:       59.0 in Accession #:    [id number]   Weight:       87.7 lb Date of Birth:  [birth year]   BSA:          1.299 m Patient Age:    89 years     BP:            128/90 mmHg Patient Gender: F            HR:           92 bpm. Exam Location:  [facility] Procedure: 2D Echo, Color Doppler and Cardiac Doppler Indications:     I50.31 congestive heart failure-Acute Diastolic  History:         Patient has prior history of Echocardiogram examinations.                  Mitral Valve Disease; Risk Factors:Hypertension and                  Dyslipidemia.  Sonographer:     Humphrey Rolls Referring Phys:  [id number] Sondra Barges Diagnosing Phys: Debbe Odea MD  Sonographer Comments: Suboptimal apical window. TDS due to pt weight of 87 lbs. IMPRESSIONS  1. Left ventricular ejection fraction, by estimation, is 60 to 65%. The left ventricle has normal function. The left ventricle has no regional wall motion abnormalities. Left ventricular diastolic parameters are consistent with Grade II diastolic dysfunction (pseudonormalization).  2. Right ventricular systolic function is low normal. The right ventricular size is normal. There is severely elevated pulmonary artery systolic pressure. The estimated right ventricular systolic pressure is 62.9 mmHg.  3. Left atrial size was severely dilated.  4. Right atrial size was moderately dilated.  5. The mitral valve is degenerative. Mild to moderate mitral valve regurgitation.  6. The aortic valve is calcified. Aortic valve regurgitation is trivial. Mild aortic valve stenosis. Aortic valve mean gradient measures 12.0 mmHg.  7. The inferior vena cava is dilated in size with <50% respiratory variability, suggesting right atrial pressure of 15 mmHg. FINDINGS  Left Ventricle: Left ventricular ejection fraction, by estimation, is 60 to 65%. The left ventricle has normal function. The left ventricle has no regional wall motion abnormalities. The left ventricular internal cavity size was normal in size. There is  no left ventricular hypertrophy. Left ventricular diastolic parameters are consistent with Grade II diastolic dysfunction (pseudonormalization). Right  Ventricle: The right ventricular size is normal. No increase in right ventricular wall thickness. Right ventricular systolic function is low normal. There is severely elevated pulmonary artery systolic pressure. The tricuspid regurgitant velocity is 3.46 m/s, and with an assumed right atrial pressure of 15 mmHg, the estimated right ventricular systolic pressure is 62.9 mmHg. Left Atrium: Left atrial size was severely dilated. Right Atrium: Right atrial size was moderately dilated. Pericardium: There is no evidence of pericardial effusion.  Mitral Valve: The mitral valve is degenerative in appearance. Mild to moderate mitral valve regurgitation. MV peak gradient, 19.7 mmHg. The mean mitral valve gradient is 3.0 mmHg. Tricuspid Valve: The tricuspid valve is normal in structure. Tricuspid valve regurgitation is mild. Aortic Valve: The aortic valve is calcified. Aortic valve regurgitation is trivial. Aortic regurgitation PHT measures 327 msec. Mild aortic stenosis is present. Aortic valve mean gradient measures 12.0 mmHg. Aortic valve peak gradient measures 21.7 mmHg.  Aortic valve area, by VTI measures 1.72 cm. Pulmonic Valve: The pulmonic valve was grossly normal. Pulmonic valve regurgitation is mild. Aorta: The aortic root and ascending aorta are structurally normal, with no evidence of dilitation. Venous: The inferior vena cava is dilated in size with less than 50% respiratory variability, suggesting right atrial pressure of 15 mmHg. IAS/Shunts: No atrial level shunt detected by color flow Doppler.  LEFT VENTRICLE PLAX 2D LVIDd:         3.93 cm   Diastology LVIDs:         2.44 cm   LV e' medial:    4.90 cm/s LV PW:         1.14 cm   LV E/e' medial:  37.1 LV IVS:        0.73 cm   LV e' lateral:   7.51 cm/s LVOT diam:     1.90 cm   LV E/e' lateral: 24.2 LV SV:         55 LV SV Index:   42 LVOT Area:     2.84 cm  RIGHT VENTRICLE RV Basal diam:  3.53 cm RV S prime:     12.40 cm/s TAPSE (M-mode): 2.1 cm LEFT ATRIUM              Index        RIGHT ATRIUM           Index LA diam:        5.40 cm 4.16 cm/m   RA Area:     21.40 cm LA Vol (A2C):   32.9 ml 25.32 ml/m  RA Volume:   59.00 ml  45.41 ml/m LA Vol (A4C):   75.6 ml 58.19 ml/m LA Biplane Vol: 53.7 ml 41.33 ml/m  AORTIC VALVE                     PULMONIC VALVE AV Area (Vmax):    1.47 cm      PV Vmax:       0.84 m/s AV Area (Vmean):   1.37 cm      PV Peak grad:  2.8 mmHg AV Area (VTI):     1.72 cm AV Vmax:           233.00 cm/s AV Vmean:          160.000 cm/s AV VTI:            0.318 m AV Peak Grad:      21.7 mmHg AV Mean Grad:      12.0 mmHg LVOT Vmax:         121.00 cm/s LVOT Vmean:        77.100 cm/s LVOT VTI:          0.193 m LVOT/AV VTI ratio: 0.61 AI PHT:            327 msec  AORTA Ao Root diam: 2.50 cm MITRAL VALVE                TRICUSPID VALVE  MV Area (PHT): 3.17 cm     TR Peak grad:   47.9 mmHg MV Area VTI:   1.13 cm     TR Vmax:        346.00 cm/s MV Peak grad:  19.7 mmHg MV Mean grad:  3.0 mmHg     SHUNTS MV Vmax:       2.22 m/s     Systemic VTI:  0.19 m MV Vmean:      71.8 cm/s    Systemic Diam: 1.90 cm MV Decel Time: 239 msec MV E velocity: 181.67 cm/s Debbe Odea MD Electronically signed by Debbe Odea MD Signature Date/Time: 02/13/2022/6:17:10 PM    Final    CT HEAD WO CONTRAST ( )  Result Date: 02/12/2022 CLINICAL DATA:  Mental status change EXAM: CT HEAD WITHOUT CONTRAST TECHNIQUE: Contiguous axial images were obtained from the base of the skull through the vertex without intravenous contrast. RADIATION DOSE REDUCTION: This exam was performed according to the departmental dose-optimization program which includes automated exposure control, adjustment of the mA and/or kV according to patient size and/or use of iterative reconstruction technique. COMPARISON:  Head CT dated December 13, 2021 FINDINGS: Brain: Chronic white matter ischemic change and generalized atrophy. No evidence of acute infarction, hemorrhage, hydrocephalus, extra-axial  collection or mass lesion/mass effect. Vascular: No hyperdense vessel or unexpected calcification. Skull: Normal. Negative for fracture or focal lesion. Sinuses/Orbits: Air-fluid level of the right sphenoid sinus. No acute abnormality. Other: None. IMPRESSION: 1. No acute intracranial abnormality. 2. Right sphenoid sinusitis. Electronically Signed   By: Allegra Lai M.D.   On: 02/12/2022 16:37   DG Chest Port 1 View  Result Date: 02/12/2022 CLINICAL DATA:  Dehydration, UTI EXAM: PORTABLE CHEST 1 VIEW COMPARISON:  02/16/2020 FINDINGS: Transverse diameter of heart is increased. Central pulmonary vessels are prominent. There is slight prominence of interstitial markings in the parahilar regions and lower lung fields. There is blunting of lateral CP angles. There is no pneumothorax. Low position of diaphragms suggests COPD. IMPRESSION: Cardiomegaly. Central pulmonary vessels are prominent. There is prominence of interstitial markings in the parahilar regions and lower lung fields suggesting mild interstitial edema or interstitial pneumonia. Small bilateral pleural effusions. Electronically Signed   By: Ernie Avena M.D.   On: 02/12/2022 16:27    Microbiology: Results for orders placed or performed during the hospital encounter of 02/12/22  Blood culture (routine x 2)     Status: None (Preliminary result)   Collection Time: 02/12/22  4:11 PM   Specimen: BLOOD  Result Value Ref Range Status   Specimen Description BLOOD RAC  Final   Special Requests BOTTLES DRAWN AEROBIC AND ANAEROBIC BCAV  Final   Culture   Final    NO GROWTH 3 DAYS Performed at [facility], [address] [place], [place] [address]    Report Status PENDING  Incomplete  Blood culture (routine x 2)     Status: None (Preliminary result)   Collection Time: 02/12/22  4:11 PM   Specimen: BLOOD  Result Value Ref Range Status   Specimen Description BLOOD UNKNOWN  Final   Special Requests   Final    BOTTLES DRAWN  AEROBIC AND ANAEROBIC Blood Culture adequate volume   Culture   Final    NO GROWTH 3 DAYS Performed at [facility], [address], [place], [place] [address]    Report Status PENDING  Incomplete  Urine Culture     Status: Abnormal   Collection Time: 02/12/22  4:11  PM   Specimen: Urine, Random  Result Value Ref Range Status   Specimen Description   Final    URINE, RANDOM Performed at [facility], [address] [place], [place] [address]    Special Requests   Final    NONE Performed at [facility], [address] [place], [place] [address]    Culture 60,000 COLONIES/mL ENTEROCOCCUS FAECALIS (A)  Final   Report Status 02/14/2022 FINAL  Final   Organism ID, Bacteria ENTEROCOCCUS FAECALIS (A)  Final      Susceptibility   Enterococcus faecalis - MIC*    AMPICILLIN <=2 SENSITIVE Sensitive     NITROFURANTOIN <=16 SENSITIVE Sensitive     VANCOMYCIN 1 SENSITIVE Sensitive     * 60,000 COLONIES/mL ENTEROCOCCUS FAECALIS  Urine Culture     Status: None   Collection Time: 02/12/22 10:22 PM   Specimen: Urine, Random  Result Value Ref Range Status   Specimen Description   Final    URINE, RANDOM Performed at [facility], [address], [place], [place] [address]    Special Requests   Final    NONE Performed at [facility], [address], [place], [place] [address]    Culture   Final    NO GROWTH Performed at [facility], [address] [place], [place] [address]    Report Status 02/14/2022 FINAL  Final    Labs: CBC: Recent Labs  Lab 02/12/22 1611 02/13/22 0600  WBC 11.1* 10.1  NEUTROABS 9.0*  --   HGB 13.7 13.4  HCT 43.6 40.8  MCV 99.5 96.7  PLT 390 324   Basic Metabolic Panel: Recent Labs  Lab 02/12/22 1611 02/13/22 0600 02/14/22 0514 02/15/22 0543  NA 135 138 137 134*  K 5.4* 3.8 3.5 3.5  CL 101 106 98 97*  CO2 26 28 32 29  GLUCOSE 131* 95 109* 117*  BUN 28* 21 32* 30*  CREATININE 1.32* 1.00  1.12* 1.15*  CALCIUM 9.3 8.5* 9.2 9.3  MG  --   --  1.9 2.1   Liver Function Tests: Recent Labs  Lab 02/12/22 1611  AST 59*  ALT 22  ALKPHOS 73  BILITOT 0.9  PROT 6.5  ALBUMIN 3.1*   CBG: No results for input(s): "GLUCAP" in the last 168 hours.  Discharge time spent: greater than 30 minutes.  Signed: Pennie Banter, DO [facility] 02/15/2022

## 2022-02-15 NOTE — TOC Transition Note (Signed)
Transition of Care [facility]) - CM/SW Discharge Note   Patient Details  Name: Brandi [name] MRN: [id number] Date of Birth: [birth year]  [facility] CM/SW Contact:  Liliana Cline, LCSW Phone Number: 02/15/2022, 3:20 PM   Clinical Narrative:    Patient to DC home today. [facility] has arranged home care aide services, bubble pack medication packets, and will provide PT and OT.  CSW faxed DC Summary to [facility] [place].   Final next level of care: OP Rehab Barriers to Discharge: Barriers Resolved   Patient Goals and CMS Choice Patient states their goals for this hospitalization and ongoing recovery are:: home with home health CMS Medicare.gov Compare Post Acute Care list provided to:: Patient Represenative (must comment) Choice offered to / list presented to : Adult Children  Discharge Placement                       Discharge Plan and Services                          HH Arranged: PT, OT, RN [facility] Agency: [facility] Date [facility] Agency Contacted: 02/14/22   Representative spoke with at [facility] Agency: Barbara Cower  Social Determinants of Health (SDOH) Interventions     Readmission Risk Interventions     No data to display

## 2022-02-15 NOTE — Plan of Care (Signed)
  Problem: Education: Goal: Knowledge of General Education information will improve Description: Including pain rating scale, medication(s)/side effects and non-pharmacologic comfort measures Outcome: Adequate for Discharge   Problem: Health Behavior/Discharge Planning: Goal: Ability to manage health-related needs will improve Outcome: Adequate for Discharge   Problem: Clinical Measurements: Goal: Ability to maintain clinical measurements within normal limits will improve Outcome: Adequate for Discharge Goal: Will remain free from infection Outcome: Adequate for Discharge Goal: Diagnostic test results will improve Outcome: Adequate for Discharge Goal: Respiratory complications will improve Outcome: Adequate for Discharge Goal: Cardiovascular complication will be avoided Outcome: Adequate for Discharge   Problem: Activity: Goal: Risk for activity intolerance will decrease Outcome: Adequate for Discharge   Problem: Nutrition: Goal: Adequate nutrition will be maintained Outcome: Adequate for Discharge   Problem: Coping: Goal: Level of anxiety will decrease Outcome: Adequate for Discharge   Problem: Elimination: Goal: Will not experience complications related to bowel motility Outcome: Adequate for Discharge Goal: Will not experience complications related to urinary retention Outcome: Adequate for Discharge   Problem: Pain Managment: Goal: General experience of comfort will improve Outcome: Adequate for Discharge   Problem: Safety: Goal: Ability to remain free from injury will improve Outcome: Adequate for Discharge   Problem: Skin Integrity: Goal: Risk for impaired skin integrity will decrease Outcome: Adequate for Discharge   Problem: Education: Goal: Ability to demonstrate management of disease process will improve Outcome: Adequate for Discharge Goal: Ability to verbalize understanding of medication therapies will improve Outcome: Adequate for Discharge Goal:  Individualized Educational Video(s) Outcome: Adequate for Discharge   Problem: Activity: Goal: Capacity to carry out activities will improve Outcome: Adequate for Discharge   Problem: Cardiac: Goal: Ability to achieve and maintain adequate cardiopulmonary perfusion will improve Outcome: Adequate for Discharge   Problem: Malnutrition  (NI-5.2) Goal: Food and/or nutrient delivery Description: Individualized approach for food/nutrient provision. Outcome: Adequate for Discharge   Problem: Acute Rehab PT Goals(only PT should resolve) Goal: Pt Will Ambulate Outcome: Adequate for Discharge Goal: Pt Will Go Up/Down Stairs Outcome: Adequate for Discharge Goal: Pt/caregiver will Perform Home Exercise Program Outcome: Adequate for Discharge

## 2022-02-17 ENCOUNTER — Telehealth: Payer: Self-pay

## 2022-02-17 LAB — CULTURE, BLOOD (ROUTINE X 2)
Culture: NO GROWTH
Culture: NO GROWTH
Special Requests: ADEQUATE

## 2022-02-17 NOTE — Telephone Encounter (Signed)
Transition Care Management Follow-up Telephone Call Date of discharge and from where: 02/15/22 [facility] How have you been since you were released from the hospital? Information received from daughter and patient, HIPAA compliant. Tired. Resting well. Denies nausea, loose stools, emesis, fever, chest pain, heart flutter/palpitation and all other harmful symptoms.  Any questions or concerns? Yes, please check to see if UTI has cleared up next office visit.   Items Reviewed: Did the pt receive and understand the discharge instructions provided? Yes  Medications obtained and verified? Yes  Any new allergies since your discharge? No  Dietary orders reviewed? Yes Do you have support at home? Yes   Home Care and Equipment/Supplies: Were home health services ordered? yes If so, what is the [facility] of the agency? [facility].   Functional Questionnaire: (I = Independent and D = Dependent) ADLs: Home care as needed  Eating- I  Maintaining continence- I  Transferring/Ambulation- I; walker in use as needed. Life alert necklace worn.   Managing Meds- Daughter and Home Care assist  Follow up appointments reviewed:  PCP Hospital f/u appt confirmed? Yes  Scheduled to see PCP on 02/27/22 @ 11:00. Specialist Hospital f/u appt confirmed? Yes  , Cardiology 03/05/22.  Are transportation arrangements needed? No  If their condition worsens, is the pt aware to call PCP or go to the Emergency Dept.? Yes Was the patient provided with contact information for the PCP's office or ED? Yes Was to pt encouraged to call back with questions or concerns? Yes

## 2022-02-19 DIAGNOSIS — I5033 Acute on chronic diastolic (congestive) heart failure: Secondary | ICD-10-CM | POA: Diagnosis not present

## 2022-02-19 DIAGNOSIS — R2689 Other abnormalities of gait and mobility: Secondary | ICD-10-CM | POA: Diagnosis not present

## 2022-02-19 DIAGNOSIS — Z9181 History of falling: Secondary | ICD-10-CM | POA: Diagnosis not present

## 2022-02-24 DIAGNOSIS — I5033 Acute on chronic diastolic (congestive) heart failure: Secondary | ICD-10-CM | POA: Diagnosis not present

## 2022-02-24 DIAGNOSIS — R2689 Other abnormalities of gait and mobility: Secondary | ICD-10-CM | POA: Diagnosis not present

## 2022-02-24 DIAGNOSIS — Z9181 History of falling: Secondary | ICD-10-CM | POA: Diagnosis not present

## 2022-02-27 ENCOUNTER — Telehealth: Payer: Self-pay | Admitting: Internal Medicine

## 2022-02-27 ENCOUNTER — Encounter: Payer: Self-pay | Admitting: Internal Medicine

## 2022-02-27 ENCOUNTER — Ambulatory Visit (INDEPENDENT_AMBULATORY_CARE_PROVIDER_SITE_OTHER): Payer: Medicare Other | Admitting: Internal Medicine

## 2022-02-27 VITALS — BP 126/72 | HR 60 | Ht 59.0 in | Wt 80.4 lb

## 2022-02-27 DIAGNOSIS — Z515 Encounter for palliative care: Secondary | ICD-10-CM

## 2022-02-27 DIAGNOSIS — D6869 Other thrombophilia: Secondary | ICD-10-CM

## 2022-02-27 DIAGNOSIS — Z09 Encounter for follow-up examination after completed treatment for conditions other than malignant neoplasm: Secondary | ICD-10-CM

## 2022-02-27 DIAGNOSIS — E43 Unspecified severe protein-calorie malnutrition: Secondary | ICD-10-CM | POA: Diagnosis not present

## 2022-02-27 NOTE — Assessment & Plan Note (Signed)
She has declined rapidly since June 1 .  She has lost > 20 lbs since June 1 due to lack of appetite and desire to "go to my husband", and is in need of Hospice Care and 24 hour supervision.  FL-2 form for transition to A/L at [facility],  DNR order signed,  And Hospice referral made.

## 2022-02-27 NOTE — Patient Instructions (Signed)
I have made a referral to Hospice for [facility].  They will call Talbert Forest or Erskine Squibb  DNR orders to keep posted   FL-2 form : give to the [facility] representative

## 2022-02-27 NOTE — Progress Notes (Signed)
Subjective:  Patient ID: Brandi [name], female    DOB: [birth year]  Age: 88 y.o. MRN: [id number]  CC: The primary encounter diagnosis was End of life care. Diagnoses of Acquired thrombophilia (HCC), Protein-calorie malnutrition, severe, and Hospital discharge follow-up were also pertinent to this visit.   HPI Brandi [name] presents for follow up on recent hospitalization . Chief Complaint  Patient presents with   Hospitalization Follow-up   Brandi [name] is a 88 yr old widowed female with  essential hypertension, dyslipidemia, moderate to severe mitral regurgitation and paroxysmal atrial fibrillation on Eliquis, who was hospitalized for delirium, dehydration and rapid atrial fibrillation  from  an untreated UTI starting on June 26.   She was treated for a UTI and rapid atrial fibrillation  and ruled out for AMI. [name]  She was discharged home to [facility] independent living . [name] She is accompanied by her step  daughter today who has been with her since the hospitalization and notes that she has continued to decline since discharge. Patient states that she is not depressed, misses her husband and is  ready to die.  She prefers to remain at home. She was widowed in 2015.   WE discussed her current state.  She denies pain and discomfort.  "I'm just so tired,  I have no strength, and I want to go be with my husband. "  We  Discussed Hospice.  Brandi [name] has 3 step daughters  from her last marriage who are all involved in her care  and taking turns staying with her. She has an aide for 1:15 daily to administer pills and help with grooming .  Sister Erskine Squibb has Healthcare POA    Outpatient Medications Prior to Visit  Medication Sig Dispense Refill   apixaban (ELIQUIS) 2.5 MG TABS tablet TAKE 1 TABLET BY MOUTH TWICE  DAILY 180 tablet 1   CALCIUM 600 1500 (600 Ca) MG TABS tablet Take by mouth.     Calcium Carbonate-Vit D-Min (CALCIUM 1200 PO) Take 600 mg by mouth.     ezetimibe (ZETIA) 10 MG tablet TAKE 1 TABLET BY MOUTH  DAILY 90 tablet 3   feeding supplement (ENSURE ENLIVE / ENSURE PLUS) LIQD Take 237 mLs by mouth 2 (two) times daily between meals. 237 mL 12   furosemide (LASIX) 40 MG tablet Take 1 tablet (40 mg total) by mouth daily. 30 tablet 1   metoprolol succinate (TOPROL-XL) 25 MG 24 hr tablet Take 3 tablets (75 mg total) by mouth 2 (two) times daily. Take with or immediately following a meal. 90 tablet 1   Multiple Vitamin (MULTIVITAMIN) tablet Take 1 tablet by mouth daily.     potassium chloride (KLOR-CON M) 10 MEQ tablet TAKE 1 TABLET BY MOUTH  DAILY 90 tablet 3   potassium chloride (KLOR-CON) 10 MEQ tablet Take 10 mEq by mouth daily.     Facility-Administered Medications Prior to Visit  Medication Dose Route Frequency Provider Last Rate Last Admin   denosumab (PROLIA) injection 60 mg  60 mg Subcutaneous Q6 months [name], MD   60 mg at 02/27/20 1106    Review of Systems;  Patient denies headache, fevers, malaise, unintentional weight loss, skin rash, eye pain, sinus congestion and sinus pain, sore throat, dysphagia,  hemoptysis , cough, dyspnea, wheezing, chest pain, palpitations, orthopnea, edema, abdominal pain, nausea, melena, diarrhea, constipation, flank pain, dysuria, hematuria, urinary  Frequency, nocturia, numbness, tingling, seizures,  Focal weakness, Loss of consciousness,  Tremor, insomnia, depression, anxiety, and  suicidal ideation.      Objective:  BP 126/72 (BP Location: Left Arm, Patient Position: Sitting, Cuff Size: Small)   Pulse 60   Ht 4\' 11"  (1.499 m)   Wt 80 lb 6.4 oz (36.5 kg)   SpO2 98%   BMI 16.24 kg/m   BP Readings from Last 3 Encounters:  02/27/22 126/72  02/15/22 (!) 109/95  02/12/22 (!) 176/89    Wt Readings from Last 3 Encounters:  02/27/22 80 lb 6.4 oz (36.5 kg)  02/15/22 85 lb 4.8 oz (38.7 kg)  02/12/22 88 lb 12.8 oz (40.3 kg)    General appearance: alert, cooperative,  cachectic appearing.     appears younger than stated age Ears: normal TM's  and external ear canals both ears Throat: lips, mucosa, and tongue normal; teeth and gums normal Neck: no adenopathy, no carotid bruit, supple, symmetrical, trachea midline and thyroid not enlarged, symmetric, no tenderness/mass/nodules Back: symmetric, no curvature. ROM normal. No CVA tenderness. Lungs: clear to auscultation bilaterally Heart: regular rate and rhythm, S1, S2 normal, no murmur, click, rub or gallop Abdomen: soft, non-tender; bowel sounds normal; no masses,  no organomegaly Pulses: 2+ and symmetric Skin: Skin color, texture, turgor normal. No rashes or lesions Lymph nodes: Cervical, supraclavicular, and axillary nodes normal.  Lab Results  Component Value Date   HGBA1C 5.1 02/17/2020   HGBA1C 5.5 05/22/2017    Lab Results  Component Value Date   CREATININE 1.15 (H) 02/15/2022   CREATININE 1.12 (H) 02/14/2022   CREATININE 1.00 02/13/2022    Lab Results  Component Value Date   WBC 10.1 02/13/2022   HGB 13.4 02/13/2022   HCT 40.8 02/13/2022   PLT 324 02/13/2022   GLUCOSE 117 (H) 02/15/2022   CHOL 165 08/14/2021   TRIG 160.0 (H) 08/14/2021   HDL 46.60 08/14/2021   LDLDIRECT 91.0 10/31/2016   LDLCALC 87 08/14/2021   ALT 22 02/12/2022   AST 59 (H) 02/12/2022   NA 134 (L) 02/15/2022   K 3.5 02/15/2022   CL 97 (L) 02/15/2022   CREATININE 1.15 (H) 02/15/2022   BUN 30 (H) 02/15/2022   CO2 29 02/15/2022   TSH 2.25 03/09/2020   HGBA1C 5.1 02/17/2020    ECHOCARDIOGRAM COMPLETE  Result Date: 02/13/2022    ECHOCARDIOGRAM REPORT   Patient Name:   Brandi [name] Date of Exam: 02/13/2022 Medical Rec #:  02/15/2022    Height:       59.0 in Accession #:    [id number]   Weight:       87.7 lb Date of Birth:  [birth year]   BSA:          1.299 m Patient Age:    88 years     BP:           128/90 mmHg Patient Gender: F            HR:           92 bpm. Exam Location:  [facility] Procedure: 2D Echo, Color Doppler and Cardiac Doppler Indications:     I50.31 congestive heart failure-Acute  Diastolic  History:         Patient has prior history of Echocardiogram examinations.                  Mitral Valve Disease; Risk Factors:Hypertension and                  Dyslipidemia.  Sonographer:     08/13/1927 Referring Phys:  [id number] Sondra Barges Diagnosing Phys: Debbe Odea MD  Sonographer Comments: Suboptimal apical window. TDS due to pt weight of 87 lbs. IMPRESSIONS  1. Left ventricular ejection fraction, by estimation, is 60 to 65%. The left ventricle has normal function. The left ventricle has no regional wall motion abnormalities. Left ventricular diastolic parameters are consistent with Grade II diastolic dysfunction (pseudonormalization).  2. Right ventricular systolic function is low normal. The right ventricular size is normal. There is severely elevated pulmonary artery systolic pressure. The estimated right ventricular systolic pressure is 62.9 mmHg.  3. Left atrial size was severely dilated.  4. Right atrial size was moderately dilated.  5. The mitral valve is degenerative. Mild to moderate mitral valve regurgitation.  6. The aortic valve is calcified. Aortic valve regurgitation is trivial. Mild aortic valve stenosis. Aortic valve mean gradient measures 12.0 mmHg.  7. The inferior vena cava is dilated in size with <50% respiratory variability, suggesting right atrial pressure of 15 mmHg. FINDINGS  Left Ventricle: Left ventricular ejection fraction, by estimation, is 60 to 65%. The left ventricle has normal function. The left ventricle has no regional wall motion abnormalities. The left ventricular internal cavity size was normal in size. There is  no left ventricular hypertrophy. Left ventricular diastolic parameters are consistent with Grade II diastolic dysfunction (pseudonormalization). Right Ventricle: The right ventricular size is normal. No increase in right ventricular wall thickness. Right ventricular systolic function is low normal. There is severely elevated pulmonary artery systolic  pressure. The tricuspid regurgitant velocity is 3.46 m/s, and with an assumed right atrial pressure of 15 mmHg, the estimated right ventricular systolic pressure is 62.9 mmHg. Left Atrium: Left atrial size was severely dilated. Right Atrium: Right atrial size was moderately dilated. Pericardium: There is no evidence of pericardial effusion. Mitral Valve: The mitral valve is degenerative in appearance. Mild to moderate mitral valve regurgitation. MV peak gradient, 19.7 mmHg. The mean mitral valve gradient is 3.0 mmHg. Tricuspid Valve: The tricuspid valve is normal in structure. Tricuspid valve regurgitation is mild. Aortic Valve: The aortic valve is calcified. Aortic valve regurgitation is trivial. Aortic regurgitation PHT measures 327 msec. Mild aortic stenosis is present. Aortic valve mean gradient measures 12.0 mmHg. Aortic valve peak gradient measures 21.7 mmHg.  Aortic valve area, by VTI measures 1.72 cm. Pulmonic Valve: The pulmonic valve was grossly normal. Pulmonic valve regurgitation is mild. Aorta: The aortic root and ascending aorta are structurally normal, with no evidence of dilitation. Venous: The inferior vena cava is dilated in size with less than 50% respiratory variability, suggesting right atrial pressure of 15 mmHg. IAS/Shunts: No atrial level shunt detected by color flow Doppler.  LEFT VENTRICLE PLAX 2D LVIDd:         3.93 cm   Diastology LVIDs:         2.44 cm   LV e' medial:    4.90 cm/s LV PW:         1.14 cm   LV E/e' medial:  37.1 LV IVS:        0.73 cm   LV e' lateral:   7.51 cm/s LVOT diam:     1.90 cm   LV E/e' lateral: 24.2 LV SV:         55 LV SV Index:   42 LVOT Area:     2.84 cm  RIGHT VENTRICLE RV Basal diam:  3.53 cm RV S prime:     12.40 cm/s TAPSE (M-mode): 2.1 cm LEFT ATRIUM  Index        RIGHT ATRIUM           Index LA diam:        5.40 cm 4.16 cm/m   RA Area:     21.40 cm LA Vol (A2C):   32.9 ml 25.32 ml/m  RA Volume:   59.00 ml  45.41 ml/m LA Vol (A4C):   75.6  ml 58.19 ml/m LA Biplane Vol: 53.7 ml 41.33 ml/m  AORTIC VALVE                     PULMONIC VALVE AV Area (Vmax):    1.47 cm      PV Vmax:       0.84 m/s AV Area (Vmean):   1.37 cm      PV Peak grad:  2.8 mmHg AV Area (VTI):     1.72 cm AV Vmax:           233.00 cm/s AV Vmean:          160.000 cm/s AV VTI:            0.318 m AV Peak Grad:      21.7 mmHg AV Mean Grad:      12.0 mmHg LVOT Vmax:         121.00 cm/s LVOT Vmean:        77.100 cm/s LVOT VTI:          0.193 m LVOT/AV VTI ratio: 0.61 AI PHT:            327 msec  AORTA Ao Root diam: 2.50 cm MITRAL VALVE                TRICUSPID VALVE MV Area (PHT): 3.17 cm     TR Peak grad:   47.9 mmHg MV Area VTI:   1.13 cm     TR Vmax:        346.00 cm/s MV Peak grad:  19.7 mmHg MV Mean grad:  3.0 mmHg     SHUNTS MV Vmax:       2.22 m/s     Systemic VTI:  0.19 m MV Vmean:      71.8 cm/s    Systemic Diam: 1.90 cm MV Decel Time: 239 msec MV E velocity: 181.67 cm/s Debbe Odea MD Electronically signed by Debbe Odea MD Signature Date/Time: 02/13/2022/6:17:10 PM    Final     Assessment & Plan:   Problem List Items Addressed This Visit     Acquired thrombophilia (HCC)    She is tolerating Eliquis for reduction in embolic stroke risk due to atrial fibrillation .       Protein-calorie malnutrition, severe    She has lost  15% of her body weight in the last month due to loss of appetite.       Hospital discharge follow-up    Patient is stable post discharge and has no new issues or questions about her discharge plans  Urine culture was repeatedand negative prior to discharge. ECHO was done , results reviewed.      End of life care - Primary    She has declined rapidly since June 1 .  She has lost > 20 lbs since June 1 due to lack of appetite and desire to "go to my husband", and is in need of Hospice Care and 24 hour supervision.  FL-2 form for transition to A/L at [facility],  DNR order signed,  And Hospice  referral made.       Relevant  Orders   Ambulatory referral to Hospice   DNR (Do Not Resuscitate)    Follow-up: No follow-ups on file.   Sherlene Shamseresa L Manami Tutor, MD

## 2022-02-27 NOTE — Telephone Encounter (Signed)
Sherri from [facility] called wanting to know if the provider will be the hospice attendee and will she see all comfort orders

## 2022-02-28 DIAGNOSIS — M810 Age-related osteoporosis without current pathological fracture: Secondary | ICD-10-CM | POA: Diagnosis not present

## 2022-02-28 DIAGNOSIS — E43 Unspecified severe protein-calorie malnutrition: Secondary | ICD-10-CM | POA: Diagnosis not present

## 2022-02-28 DIAGNOSIS — Z681 Body mass index (BMI) 19 or less, adult: Secondary | ICD-10-CM | POA: Diagnosis not present

## 2022-02-28 DIAGNOSIS — I4891 Unspecified atrial fibrillation: Secondary | ICD-10-CM | POA: Diagnosis not present

## 2022-02-28 DIAGNOSIS — I080 Rheumatic disorders of both mitral and aortic valves: Secondary | ICD-10-CM | POA: Diagnosis not present

## 2022-02-28 DIAGNOSIS — F0390 Unspecified dementia without behavioral disturbance: Secondary | ICD-10-CM | POA: Diagnosis not present

## 2022-02-28 DIAGNOSIS — I5030 Unspecified diastolic (congestive) heart failure: Secondary | ICD-10-CM | POA: Diagnosis not present

## 2022-02-28 DIAGNOSIS — N179 Acute kidney failure, unspecified: Secondary | ICD-10-CM | POA: Diagnosis not present

## 2022-02-28 DIAGNOSIS — I272 Pulmonary hypertension, unspecified: Secondary | ICD-10-CM | POA: Diagnosis not present

## 2022-02-28 DIAGNOSIS — N390 Urinary tract infection, site not specified: Secondary | ICD-10-CM | POA: Diagnosis not present

## 2022-02-28 DIAGNOSIS — E785 Hyperlipidemia, unspecified: Secondary | ICD-10-CM | POA: Diagnosis not present

## 2022-02-28 DIAGNOSIS — I110 Hypertensive heart disease with heart failure: Secondary | ICD-10-CM | POA: Diagnosis not present

## 2022-02-28 NOTE — Telephone Encounter (Signed)
Spoke with referral intake at [facility] and they are aware that Dr. Darrick Huntsman will be the attending physician.

## 2022-03-01 ENCOUNTER — Encounter: Payer: Self-pay | Admitting: Internal Medicine

## 2022-03-01 DIAGNOSIS — I4891 Unspecified atrial fibrillation: Secondary | ICD-10-CM | POA: Diagnosis not present

## 2022-03-01 DIAGNOSIS — E43 Unspecified severe protein-calorie malnutrition: Secondary | ICD-10-CM | POA: Diagnosis not present

## 2022-03-01 DIAGNOSIS — N390 Urinary tract infection, site not specified: Secondary | ICD-10-CM | POA: Diagnosis not present

## 2022-03-01 DIAGNOSIS — I110 Hypertensive heart disease with heart failure: Secondary | ICD-10-CM | POA: Diagnosis not present

## 2022-03-01 DIAGNOSIS — F0390 Unspecified dementia without behavioral disturbance: Secondary | ICD-10-CM | POA: Diagnosis not present

## 2022-03-01 DIAGNOSIS — N179 Acute kidney failure, unspecified: Secondary | ICD-10-CM | POA: Diagnosis not present

## 2022-03-01 NOTE — Assessment & Plan Note (Signed)
She has lost  15% of her body weight in the last month due to loss of appetite.

## 2022-03-01 NOTE — Assessment & Plan Note (Addendum)
She is tolerating Eliquis for reduction in embolic stroke risk due to atrial fibrillation .

## 2022-03-01 NOTE — Assessment & Plan Note (Addendum)
Patient is stable post discharge and has no new issues or questions about her discharge plans  Urine culture was repeatedand negative prior to discharge. ECHO was done , results reviewed.

## 2022-03-02 DIAGNOSIS — N390 Urinary tract infection, site not specified: Secondary | ICD-10-CM | POA: Diagnosis not present

## 2022-03-02 DIAGNOSIS — N179 Acute kidney failure, unspecified: Secondary | ICD-10-CM | POA: Diagnosis not present

## 2022-03-02 DIAGNOSIS — E43 Unspecified severe protein-calorie malnutrition: Secondary | ICD-10-CM | POA: Diagnosis not present

## 2022-03-02 DIAGNOSIS — I110 Hypertensive heart disease with heart failure: Secondary | ICD-10-CM | POA: Diagnosis not present

## 2022-03-02 DIAGNOSIS — I4891 Unspecified atrial fibrillation: Secondary | ICD-10-CM | POA: Diagnosis not present

## 2022-03-02 DIAGNOSIS — F0390 Unspecified dementia without behavioral disturbance: Secondary | ICD-10-CM | POA: Diagnosis not present

## 2022-03-02 NOTE — Progress Notes (Deleted)
   Patient ID: Brandi [name], female    DOB: 1927-05-04, 88 y.o.   MRN: [id number]  HPI  Ms Sanville is a 88 y/o female with a history of  Echo report from 02/13/22 showed an EF of 60-65% along with severely elevated PA pressure of 62.9 mmHg, severe LAE and mild/moderate MR.   Review of Systems    Physical Exam    Assessment & Plan:  1: Chronic heart failure with preserved ejection fraction with structural changes (LAE)- - NYHA class

## 2022-03-03 DIAGNOSIS — N390 Urinary tract infection, site not specified: Secondary | ICD-10-CM | POA: Diagnosis not present

## 2022-03-03 DIAGNOSIS — N179 Acute kidney failure, unspecified: Secondary | ICD-10-CM | POA: Diagnosis not present

## 2022-03-03 DIAGNOSIS — E43 Unspecified severe protein-calorie malnutrition: Secondary | ICD-10-CM | POA: Diagnosis not present

## 2022-03-03 DIAGNOSIS — I4891 Unspecified atrial fibrillation: Secondary | ICD-10-CM | POA: Diagnosis not present

## 2022-03-03 DIAGNOSIS — I110 Hypertensive heart disease with heart failure: Secondary | ICD-10-CM | POA: Diagnosis not present

## 2022-03-03 DIAGNOSIS — F0390 Unspecified dementia without behavioral disturbance: Secondary | ICD-10-CM | POA: Diagnosis not present

## 2022-03-05 ENCOUNTER — Ambulatory Visit: Payer: Medicare Other | Admitting: Family

## 2022-03-05 DIAGNOSIS — I4891 Unspecified atrial fibrillation: Secondary | ICD-10-CM | POA: Diagnosis not present

## 2022-03-05 DIAGNOSIS — E43 Unspecified severe protein-calorie malnutrition: Secondary | ICD-10-CM | POA: Diagnosis not present

## 2022-03-05 DIAGNOSIS — N390 Urinary tract infection, site not specified: Secondary | ICD-10-CM | POA: Diagnosis not present

## 2022-03-05 DIAGNOSIS — F0390 Unspecified dementia without behavioral disturbance: Secondary | ICD-10-CM | POA: Diagnosis not present

## 2022-03-05 DIAGNOSIS — I110 Hypertensive heart disease with heart failure: Secondary | ICD-10-CM | POA: Diagnosis not present

## 2022-03-05 DIAGNOSIS — N179 Acute kidney failure, unspecified: Secondary | ICD-10-CM | POA: Diagnosis not present

## 2022-03-10 DIAGNOSIS — N390 Urinary tract infection, site not specified: Secondary | ICD-10-CM | POA: Diagnosis not present

## 2022-03-10 DIAGNOSIS — I110 Hypertensive heart disease with heart failure: Secondary | ICD-10-CM | POA: Diagnosis not present

## 2022-03-10 DIAGNOSIS — E43 Unspecified severe protein-calorie malnutrition: Secondary | ICD-10-CM | POA: Diagnosis not present

## 2022-03-10 DIAGNOSIS — I4891 Unspecified atrial fibrillation: Secondary | ICD-10-CM | POA: Diagnosis not present

## 2022-03-10 DIAGNOSIS — F0390 Unspecified dementia without behavioral disturbance: Secondary | ICD-10-CM | POA: Diagnosis not present

## 2022-03-10 DIAGNOSIS — N179 Acute kidney failure, unspecified: Secondary | ICD-10-CM | POA: Diagnosis not present

## 2022-03-14 ENCOUNTER — Other Ambulatory Visit: Payer: Self-pay | Admitting: Cardiovascular Disease

## 2022-03-14 DIAGNOSIS — Z0279 Encounter for issue of other medical certificate: Secondary | ICD-10-CM

## 2022-03-17 ENCOUNTER — Telehealth: Payer: Self-pay | Admitting: Internal Medicine

## 2022-03-17 DIAGNOSIS — E43 Unspecified severe protein-calorie malnutrition: Secondary | ICD-10-CM | POA: Diagnosis not present

## 2022-03-17 DIAGNOSIS — N179 Acute kidney failure, unspecified: Secondary | ICD-10-CM | POA: Diagnosis not present

## 2022-03-17 DIAGNOSIS — I4891 Unspecified atrial fibrillation: Secondary | ICD-10-CM | POA: Diagnosis not present

## 2022-03-17 DIAGNOSIS — F0390 Unspecified dementia without behavioral disturbance: Secondary | ICD-10-CM | POA: Diagnosis not present

## 2022-03-17 DIAGNOSIS — I110 Hypertensive heart disease with heart failure: Secondary | ICD-10-CM | POA: Diagnosis not present

## 2022-03-17 DIAGNOSIS — N390 Urinary tract infection, site not specified: Secondary | ICD-10-CM | POA: Diagnosis not present

## 2022-03-17 NOTE — Telephone Encounter (Signed)
I received an FMLA form for son Brandi [name] with no information as to what he is requesting:  is it  continuous leave  or intermittent leave? (Is he taking shifts,  weeks,  days,  etc0

## 2022-03-18 DIAGNOSIS — I080 Rheumatic disorders of both mitral and aortic valves: Secondary | ICD-10-CM | POA: Diagnosis not present

## 2022-03-18 DIAGNOSIS — Z681 Body mass index (BMI) 19 or less, adult: Secondary | ICD-10-CM | POA: Diagnosis not present

## 2022-03-18 DIAGNOSIS — F0390 Unspecified dementia without behavioral disturbance: Secondary | ICD-10-CM | POA: Diagnosis not present

## 2022-03-18 DIAGNOSIS — I4891 Unspecified atrial fibrillation: Secondary | ICD-10-CM | POA: Diagnosis not present

## 2022-03-18 DIAGNOSIS — I5030 Unspecified diastolic (congestive) heart failure: Secondary | ICD-10-CM | POA: Diagnosis not present

## 2022-03-18 DIAGNOSIS — N179 Acute kidney failure, unspecified: Secondary | ICD-10-CM | POA: Diagnosis not present

## 2022-03-18 DIAGNOSIS — N390 Urinary tract infection, site not specified: Secondary | ICD-10-CM | POA: Diagnosis not present

## 2022-03-18 DIAGNOSIS — M810 Age-related osteoporosis without current pathological fracture: Secondary | ICD-10-CM | POA: Diagnosis not present

## 2022-03-18 DIAGNOSIS — E43 Unspecified severe protein-calorie malnutrition: Secondary | ICD-10-CM | POA: Diagnosis not present

## 2022-03-18 DIAGNOSIS — I110 Hypertensive heart disease with heart failure: Secondary | ICD-10-CM | POA: Diagnosis not present

## 2022-03-18 DIAGNOSIS — E785 Hyperlipidemia, unspecified: Secondary | ICD-10-CM | POA: Diagnosis not present

## 2022-03-18 DIAGNOSIS — I272 Pulmonary hypertension, unspecified: Secondary | ICD-10-CM | POA: Diagnosis not present

## 2022-03-18 NOTE — Telephone Encounter (Signed)
I spoke with patient's son & he stated that when he spoke to company in regards to [facility] they advised that the paperwork be filled out for intermittent leave. 5x a week for 40 hours his first trip from [facility] here to be with patient is 8/12-8/23. He will be making another trip but unsure if dates. Just needs to be intermittent for 1 year 5x a week for a total of 40 hours.  If further questions we can call him Janyth Pupa) at [phone].

## 2022-03-21 ENCOUNTER — Telehealth: Payer: Self-pay | Admitting: Internal Medicine

## 2022-03-21 DIAGNOSIS — F0390 Unspecified dementia without behavioral disturbance: Secondary | ICD-10-CM | POA: Diagnosis not present

## 2022-03-21 DIAGNOSIS — E43 Unspecified severe protein-calorie malnutrition: Secondary | ICD-10-CM | POA: Diagnosis not present

## 2022-03-21 DIAGNOSIS — N390 Urinary tract infection, site not specified: Secondary | ICD-10-CM | POA: Diagnosis not present

## 2022-03-21 DIAGNOSIS — I4891 Unspecified atrial fibrillation: Secondary | ICD-10-CM | POA: Diagnosis not present

## 2022-03-21 DIAGNOSIS — N179 Acute kidney failure, unspecified: Secondary | ICD-10-CM | POA: Diagnosis not present

## 2022-03-21 DIAGNOSIS — I110 Hypertensive heart disease with heart failure: Secondary | ICD-10-CM | POA: Diagnosis not present

## 2022-03-21 NOTE — Telephone Encounter (Signed)
Hospice called to let Dr Darrick Huntsman know that they will be sending a request by fax for [facility] for patient.

## 2022-03-23 DIAGNOSIS — N179 Acute kidney failure, unspecified: Secondary | ICD-10-CM | POA: Diagnosis not present

## 2022-03-23 DIAGNOSIS — E43 Unspecified severe protein-calorie malnutrition: Secondary | ICD-10-CM | POA: Diagnosis not present

## 2022-03-23 DIAGNOSIS — I110 Hypertensive heart disease with heart failure: Secondary | ICD-10-CM | POA: Diagnosis not present

## 2022-03-23 DIAGNOSIS — F0390 Unspecified dementia without behavioral disturbance: Secondary | ICD-10-CM | POA: Diagnosis not present

## 2022-03-23 DIAGNOSIS — I4891 Unspecified atrial fibrillation: Secondary | ICD-10-CM | POA: Diagnosis not present

## 2022-03-23 DIAGNOSIS — N390 Urinary tract infection, site not specified: Secondary | ICD-10-CM | POA: Diagnosis not present

## 2022-03-24 ENCOUNTER — Telehealth: Payer: Self-pay | Admitting: Internal Medicine

## 2022-03-24 ENCOUNTER — Other Ambulatory Visit: Payer: Self-pay | Admitting: Internal Medicine

## 2022-03-24 DIAGNOSIS — N179 Acute kidney failure, unspecified: Secondary | ICD-10-CM | POA: Diagnosis not present

## 2022-03-24 DIAGNOSIS — E43 Unspecified severe protein-calorie malnutrition: Secondary | ICD-10-CM | POA: Diagnosis not present

## 2022-03-24 DIAGNOSIS — I110 Hypertensive heart disease with heart failure: Secondary | ICD-10-CM | POA: Diagnosis not present

## 2022-03-24 DIAGNOSIS — N390 Urinary tract infection, site not specified: Secondary | ICD-10-CM | POA: Diagnosis not present

## 2022-03-24 DIAGNOSIS — F0390 Unspecified dementia without behavioral disturbance: Secondary | ICD-10-CM | POA: Diagnosis not present

## 2022-03-24 DIAGNOSIS — I4891 Unspecified atrial fibrillation: Secondary | ICD-10-CM | POA: Diagnosis not present

## 2022-03-24 MED ORDER — MORPHINE SULFATE (CONCENTRATE) 20 MG/ML PO SOLN: 5.0000 mg | ORAL | 0 refills | Status: AC | PRN

## 2022-03-24 MED ORDER — ALPRAZOLAM 0.25 MG PO TABS: 0.2500 mg | ORAL_TABLET | Freq: Three times a day (TID) | ORAL | 0 refills | Status: AC | PRN

## 2022-03-24 NOTE — Telephone Encounter (Signed)
Sha'nidra, from [facility], is requesting update Discharge Summary/Orders for this patient.  They must have them within a 30 day window.  The ones they received are dated 02/15/22.  They can be reached by phone or fax:  [phone] / [phone]

## 2022-03-24 NOTE — Telephone Encounter (Signed)
LMTCB with Brandi [name] at [facility]. Need to let her know that we have not received any more discharge summary/ orders.

## 2022-03-25 DIAGNOSIS — I110 Hypertensive heart disease with heart failure: Secondary | ICD-10-CM | POA: Diagnosis not present

## 2022-03-25 DIAGNOSIS — N179 Acute kidney failure, unspecified: Secondary | ICD-10-CM | POA: Diagnosis not present

## 2022-03-25 DIAGNOSIS — F0390 Unspecified dementia without behavioral disturbance: Secondary | ICD-10-CM | POA: Diagnosis not present

## 2022-03-25 DIAGNOSIS — N390 Urinary tract infection, site not specified: Secondary | ICD-10-CM | POA: Diagnosis not present

## 2022-03-25 DIAGNOSIS — E43 Unspecified severe protein-calorie malnutrition: Secondary | ICD-10-CM | POA: Diagnosis not present

## 2022-03-25 DIAGNOSIS — I4891 Unspecified atrial fibrillation: Secondary | ICD-10-CM | POA: Diagnosis not present

## 2022-03-26 DIAGNOSIS — N179 Acute kidney failure, unspecified: Secondary | ICD-10-CM | POA: Diagnosis not present

## 2022-03-26 DIAGNOSIS — E43 Unspecified severe protein-calorie malnutrition: Secondary | ICD-10-CM | POA: Diagnosis not present

## 2022-03-26 DIAGNOSIS — I4891 Unspecified atrial fibrillation: Secondary | ICD-10-CM | POA: Diagnosis not present

## 2022-03-26 DIAGNOSIS — N390 Urinary tract infection, site not specified: Secondary | ICD-10-CM | POA: Diagnosis not present

## 2022-03-26 DIAGNOSIS — I110 Hypertensive heart disease with heart failure: Secondary | ICD-10-CM | POA: Diagnosis not present

## 2022-03-26 DIAGNOSIS — F0390 Unspecified dementia without behavioral disturbance: Secondary | ICD-10-CM | POA: Diagnosis not present

## 2022-03-27 ENCOUNTER — Telehealth: Payer: Self-pay | Admitting: *Deleted

## 2022-03-27 DIAGNOSIS — I110 Hypertensive heart disease with heart failure: Secondary | ICD-10-CM | POA: Diagnosis not present

## 2022-03-27 DIAGNOSIS — F0390 Unspecified dementia without behavioral disturbance: Secondary | ICD-10-CM | POA: Diagnosis not present

## 2022-03-27 DIAGNOSIS — N390 Urinary tract infection, site not specified: Secondary | ICD-10-CM | POA: Diagnosis not present

## 2022-03-27 DIAGNOSIS — E43 Unspecified severe protein-calorie malnutrition: Secondary | ICD-10-CM | POA: Diagnosis not present

## 2022-03-27 DIAGNOSIS — N179 Acute kidney failure, unspecified: Secondary | ICD-10-CM | POA: Diagnosis not present

## 2022-03-27 DIAGNOSIS — I4891 Unspecified atrial fibrillation: Secondary | ICD-10-CM | POA: Diagnosis not present

## 2022-04-01 NOTE — Telephone Encounter (Signed)
Spoke with pt's son and he stated that the [facility] paperwork has already been submitted and approved. He did say that they are waiting for the death certificate to be signed off on. I advised him that Dr. Darrick Huntsman has been out of the office but will return later today. Pt's son gave a verbal understanding.

## 2022-04-01 NOTE — Telephone Encounter (Signed)
Pt's son is aware

## 2022-04-18 NOTE — Telephone Encounter (Signed)
$  0 due, no PA Req'd for Prolia.  Patient due on or after 01/25/22

## 2022-04-18 NOTE — Telephone Encounter (Signed)
Spoke with family & was informed that Brandi [name] passed away around noon today. Our condolences was given & I received permission to notify PCP.   Prolia chart will be archived

## 2022-04-18 DEATH — deceased

## 2022-07-28 ENCOUNTER — Ambulatory Visit: Payer: Medicare Other | Admitting: Cardiovascular Disease

## 2024-07-06 NOTE — Telephone Encounter (Signed)
 open in error
